# Patient Record
Sex: Male | Born: 1940
Health system: Southern US, Community
[De-identification: ages and names within clinical notes are randomized; demographics above are authoritative.]

## PROBLEM LIST (undated history)

## (undated) ENCOUNTER — Ambulatory Visit: Payer: Medicare Other

## (undated) DIAGNOSIS — H9209 Otalgia, unspecified ear: Secondary | ICD-10-CM

## (undated) DIAGNOSIS — M1990 Unspecified osteoarthritis, unspecified site: Secondary | ICD-10-CM

## (undated) DIAGNOSIS — N400 Enlarged prostate without lower urinary tract symptoms: Secondary | ICD-10-CM

## (undated) DIAGNOSIS — R972 Elevated prostate specific antigen [PSA]: Secondary | ICD-10-CM

## (undated) DIAGNOSIS — R079 Chest pain, unspecified: Secondary | ICD-10-CM

## (undated) DIAGNOSIS — R21 Rash and other nonspecific skin eruption: Secondary | ICD-10-CM

## (undated) DIAGNOSIS — Z87898 Personal history of other specified conditions: Secondary | ICD-10-CM

## (undated) DIAGNOSIS — E785 Hyperlipidemia, unspecified: Secondary | ICD-10-CM

## (undated) HISTORY — DX: Unspecified osteoarthritis, unspecified site: M19.90

## (undated) HISTORY — DX: Hyperlipidemia, unspecified: E78.5

## (undated) HISTORY — DX: Personal history of other specified conditions: Z87.898

## (undated) HISTORY — PX: JOINT REPLACEMENT: SHX530

## (undated) HISTORY — PX: OTHER SURGICAL HISTORY: SHX169

## (undated) HISTORY — DX: Benign prostatic hyperplasia without lower urinary tract symptoms: N40.0

## (undated) HISTORY — PX: EYE SURGERY: SHX253

## (undated) HISTORY — DX: Chest pain, unspecified: R07.9

## (undated) HISTORY — DX: Elevated prostate specific antigen (PSA): R97.20

## (undated) HISTORY — DX: Rash and other nonspecific skin eruption: R21

## (undated) HISTORY — PX: TONSILLECTOMY: SUR1361

## (undated) HISTORY — DX: Otalgia, unspecified ear: H92.09

---

## 1996-12-12 HISTORY — PX: OTHER SURGICAL HISTORY: SHX169

## 1999-02-08 ENCOUNTER — Ambulatory Visit (HOSPITAL_BASED_OUTPATIENT_CLINIC_OR_DEPARTMENT_OTHER): Admission: RE | Admit: 1999-02-08 | Discharge: 1999-02-08 | Payer: Self-pay | Admitting: *Deleted

## 2000-10-17 ENCOUNTER — Encounter (INDEPENDENT_AMBULATORY_CARE_PROVIDER_SITE_OTHER): Payer: Self-pay

## 2000-10-17 ENCOUNTER — Other Ambulatory Visit: Admission: RE | Admit: 2000-10-17 | Discharge: 2000-10-17 | Payer: Self-pay | Admitting: Urology

## 2000-12-12 HISTORY — PX: OTHER SURGICAL HISTORY: SHX169

## 2001-10-23 ENCOUNTER — Encounter: Payer: Self-pay | Admitting: *Deleted

## 2001-10-25 ENCOUNTER — Inpatient Hospital Stay (HOSPITAL_COMMUNITY): Admission: RE | Admit: 2001-10-25 | Discharge: 2001-10-29 | Payer: Self-pay | Admitting: *Deleted

## 2001-11-30 ENCOUNTER — Encounter: Admission: RE | Admit: 2001-11-30 | Discharge: 2002-02-28 | Payer: Self-pay | Admitting: *Deleted

## 2005-11-21 ENCOUNTER — Encounter (INDEPENDENT_AMBULATORY_CARE_PROVIDER_SITE_OTHER): Payer: Self-pay | Admitting: Specialist

## 2005-11-21 ENCOUNTER — Inpatient Hospital Stay (HOSPITAL_COMMUNITY): Admission: RE | Admit: 2005-11-21 | Discharge: 2005-11-24 | Payer: Self-pay | Admitting: Urology

## 2006-08-22 ENCOUNTER — Ambulatory Visit: Payer: Self-pay | Admitting: Internal Medicine

## 2006-08-29 ENCOUNTER — Ambulatory Visit: Payer: Self-pay | Admitting: Internal Medicine

## 2008-03-06 ENCOUNTER — Ambulatory Visit: Payer: Self-pay | Admitting: Internal Medicine

## 2008-03-06 LAB — CONVERTED CEMR LAB
ALT: 18 units/L (ref 0–53)
AST: 27 units/L (ref 0–37)
Albumin: 3.9 g/dL (ref 3.5–5.2)
Alkaline Phosphatase: 59 units/L (ref 39–117)
BUN: 9 mg/dL (ref 6–23)
Basophils Absolute: 0 10*3/uL (ref 0.0–0.1)
Basophils Relative: 0.9 % (ref 0.0–1.0)
Bilirubin Urine: NEGATIVE
Bilirubin, Direct: 0.1 mg/dL (ref 0.0–0.3)
CO2: 32 meq/L (ref 19–32)
Calcium: 9.1 mg/dL (ref 8.4–10.5)
Chloride: 103 meq/L (ref 96–112)
Cholesterol: 162 mg/dL (ref 0–200)
Creatinine, Ser: 0.9 mg/dL (ref 0.4–1.5)
Eosinophils Absolute: 0.2 10*3/uL (ref 0.0–0.6)
Eosinophils Relative: 4.5 % (ref 0.0–5.0)
GFR calc Af Amer: 108 mL/min
GFR calc non Af Amer: 89 mL/min
Glucose, Bld: 93 mg/dL (ref 70–99)
HCT: 45.1 % (ref 39.0–52.0)
HDL: 43.1 mg/dL (ref >39.0)
Hemoglobin, Urine: NEGATIVE
Hemoglobin: 14.5 g/dL (ref 13.0–17.0)
Ketones, ur: NEGATIVE mg/dL
LDL Cholesterol: 98 mg/dL (ref 0–99)
Leukocytes, UA: NEGATIVE
Lymphocytes Relative: 31.6 % (ref 12.0–46.0)
MCHC: 32.1 g/dL (ref 30.0–36.0)
MCV: 93.8 fL (ref 78.0–100.0)
Monocytes Absolute: 0.4 10*3/uL (ref 0.2–0.7)
Monocytes Relative: 8.4 % (ref 3.0–11.0)
Neutro Abs: 2.8 10*3/uL (ref 1.4–7.7)
Neutrophils Relative %: 54.6 % (ref 43.0–77.0)
Nitrite: NEGATIVE
PSA: 1.42 ng/mL (ref 0.10–4.00)
Platelets: 204 10*3/uL (ref 150–400)
Potassium: 4.2 meq/L (ref 3.5–5.1)
RBC: 4.81 M/uL (ref 4.22–5.81)
RDW: 13.3 % (ref 11.5–14.6)
Sodium: 139 meq/L (ref 135–145)
Specific Gravity, Urine: 1.01 (ref 1.000–1.03)
TSH: 1.85 microintl units/mL (ref 0.35–5.50)
Total Bilirubin: 0.7 mg/dL (ref 0.3–1.2)
Total CHOL/HDL Ratio: 3.8
Total Protein, Urine: NEGATIVE mg/dL
Total Protein: 6.6 g/dL (ref 6.0–8.3)
Triglycerides: 103 mg/dL (ref 0–149)
Urine Glucose: NEGATIVE mg/dL
Urobilinogen, UA: 0.2 (ref 0.0–1.0)
VLDL: 21 mg/dL (ref 0–40)
WBC: 5 10*3/uL (ref 4.5–10.5)
pH: 6.5 (ref 5.0–8.0)

## 2008-03-10 ENCOUNTER — Ambulatory Visit: Payer: Self-pay | Admitting: Internal Medicine

## 2008-03-10 DIAGNOSIS — M1990 Unspecified osteoarthritis, unspecified site: Secondary | ICD-10-CM | POA: Insufficient documentation

## 2008-03-10 DIAGNOSIS — Z87898 Personal history of other specified conditions: Secondary | ICD-10-CM

## 2008-03-10 DIAGNOSIS — R079 Chest pain, unspecified: Secondary | ICD-10-CM

## 2008-03-10 DIAGNOSIS — E785 Hyperlipidemia, unspecified: Secondary | ICD-10-CM | POA: Insufficient documentation

## 2008-03-10 DIAGNOSIS — N400 Enlarged prostate without lower urinary tract symptoms: Secondary | ICD-10-CM

## 2008-03-10 HISTORY — DX: Hyperlipidemia, unspecified: E78.5

## 2008-03-10 HISTORY — DX: Benign prostatic hyperplasia without lower urinary tract symptoms: N40.0

## 2008-03-10 HISTORY — DX: Unspecified osteoarthritis, unspecified site: M19.90

## 2008-03-10 HISTORY — DX: Personal history of other specified conditions: Z87.898

## 2008-03-10 HISTORY — DX: Chest pain, unspecified: R07.9

## 2008-03-17 ENCOUNTER — Encounter: Payer: Self-pay | Admitting: Internal Medicine

## 2008-03-18 ENCOUNTER — Encounter: Payer: Self-pay | Admitting: Internal Medicine

## 2008-03-18 ENCOUNTER — Ambulatory Visit: Payer: Self-pay

## 2008-12-09 ENCOUNTER — Ambulatory Visit: Payer: Self-pay | Admitting: Internal Medicine

## 2008-12-09 DIAGNOSIS — H9209 Otalgia, unspecified ear: Secondary | ICD-10-CM

## 2008-12-09 HISTORY — DX: Otalgia, unspecified ear: H92.09

## 2009-03-16 ENCOUNTER — Encounter: Payer: Self-pay | Admitting: Internal Medicine

## 2009-03-16 ENCOUNTER — Ambulatory Visit: Payer: Self-pay | Admitting: Family Medicine

## 2009-03-16 DIAGNOSIS — R972 Elevated prostate specific antigen [PSA]: Secondary | ICD-10-CM | POA: Insufficient documentation

## 2009-03-16 DIAGNOSIS — R21 Rash and other nonspecific skin eruption: Secondary | ICD-10-CM

## 2009-03-16 HISTORY — DX: Elevated prostate specific antigen (PSA): R97.20

## 2009-03-16 HISTORY — DX: Rash and other nonspecific skin eruption: R21

## 2009-03-17 LAB — CONVERTED CEMR LAB
ALT: 18 units/L (ref 0–53)
AST: 33 units/L (ref 0–37)
Albumin: 4.1 g/dL (ref 3.5–5.2)
Alkaline Phosphatase: 63 units/L (ref 39–117)
BUN: 23 mg/dL (ref 6–23)
Basophils Absolute: 0 10*3/uL (ref 0.0–0.1)
Basophils Relative: 0.7 % (ref 0.0–3.0)
Bilirubin Urine: NEGATIVE
Bilirubin, Direct: 0.2 mg/dL (ref 0.0–0.3)
CO2: 29 meq/L (ref 19–32)
Calcium: 9.2 mg/dL (ref 8.4–10.5)
Chloride: 108 meq/L (ref 96–112)
Cholesterol: 171 mg/dL (ref 0–200)
Creatinine, Ser: 0.7 mg/dL (ref 0.4–1.5)
Eosinophils Absolute: 0.1 10*3/uL (ref 0.0–0.7)
Eosinophils Relative: 1.8 % (ref 0.0–5.0)
GFR calc non Af Amer: 118.45 mL/min (ref >60)
Glucose, Bld: 83 mg/dL (ref 70–99)
HCT: 44.8 % (ref 39.0–52.0)
HDL: 42 mg/dL (ref >39.00)
Hemoglobin, Urine: NEGATIVE
Hemoglobin: 15 g/dL (ref 13.0–17.0)
LDL Cholesterol: 113 mg/dL — ABNORMAL HIGH (ref 0–99)
Leukocytes, UA: NEGATIVE
Lymphocytes Relative: 31 % (ref 12.0–46.0)
Lymphs Abs: 1.7 10*3/uL (ref 0.7–4.0)
MCHC: 33.5 g/dL (ref 30.0–36.0)
MCV: 92.2 fL (ref 78.0–100.0)
Monocytes Absolute: 0.6 10*3/uL (ref 0.1–1.0)
Monocytes Relative: 10.5 % (ref 3.0–12.0)
Neutro Abs: 3.2 10*3/uL (ref 1.4–7.7)
Neutrophils Relative %: 56 % (ref 43.0–77.0)
Nitrite: NEGATIVE
PSA: 2.6 ng/mL (ref 0.10–4.00)
Platelets: 184 10*3/uL (ref 150.0–400.0)
Potassium: 4.1 meq/L (ref 3.5–5.1)
RBC: 4.86 M/uL (ref 4.22–5.81)
RDW: 13.1 % (ref 11.5–14.6)
Sodium: 141 meq/L (ref 135–145)
Specific Gravity, Urine: >=1.03 (ref 1.000–1.030)
TSH: 1.88 microintl units/mL (ref 0.35–5.50)
Total Bilirubin: 0.9 mg/dL (ref 0.3–1.2)
Total CHOL/HDL Ratio: 4
Total Protein: 6.9 g/dL (ref 6.0–8.3)
Triglycerides: 79 mg/dL (ref 0.0–149.0)
Urine Glucose: NEGATIVE mg/dL
Urobilinogen, UA: 0.2 (ref 0.0–1.0)
VLDL: 15.8 mg/dL (ref 0.0–40.0)
WBC: 5.6 10*3/uL (ref 4.5–10.5)
pH: 6 (ref 5.0–8.0)

## 2009-04-13 ENCOUNTER — Ambulatory Visit: Payer: Self-pay | Admitting: Gastroenterology

## 2009-04-23 ENCOUNTER — Ambulatory Visit: Payer: Self-pay | Admitting: Gastroenterology

## 2009-04-23 LAB — HM COLONOSCOPY

## 2010-03-18 ENCOUNTER — Ambulatory Visit: Payer: Self-pay | Admitting: Internal Medicine

## 2011-01-09 LAB — CONVERTED CEMR LAB
ALT: 26 units/L (ref 0–53)
AST: 38 units/L — ABNORMAL HIGH (ref 0–37)
Albumin: 4.4 g/dL (ref 3.5–5.2)
Alkaline Phosphatase: 57 units/L (ref 39–117)
BUN: 19 mg/dL (ref 6–23)
Basophils Absolute: 0 10*3/uL (ref 0.0–0.1)
Basophils Relative: 0.5 % (ref 0.0–3.0)
Bilirubin Urine: NEGATIVE
Bilirubin, Direct: 0.2 mg/dL (ref 0.0–0.3)
CO2: 31 meq/L (ref 19–32)
Calcium: 9.5 mg/dL (ref 8.4–10.5)
Chloride: 106 meq/L (ref 96–112)
Cholesterol: 164 mg/dL (ref 0–200)
Creatinine, Ser: 0.9 mg/dL (ref 0.4–1.5)
Eosinophils Absolute: 0 10*3/uL (ref 0.0–0.7)
Eosinophils Relative: 0.6 % (ref 0.0–5.0)
GFR calc non Af Amer: 88.37 mL/min (ref >60)
Glucose, Bld: 90 mg/dL (ref 70–99)
HCT: 45.5 % (ref 39.0–52.0)
HDL: 46.8 mg/dL (ref >39.00)
Hemoglobin, Urine: NEGATIVE
Hemoglobin: 15.4 g/dL (ref 13.0–17.0)
Ketones, ur: NEGATIVE mg/dL
LDL Cholesterol: 105 mg/dL — ABNORMAL HIGH (ref 0–99)
Leukocytes, UA: NEGATIVE
Lymphocytes Relative: 23.5 % (ref 12.0–46.0)
Lymphs Abs: 1.7 10*3/uL (ref 0.7–4.0)
MCHC: 33.8 g/dL (ref 30.0–36.0)
MCV: 93.1 fL (ref 78.0–100.0)
Monocytes Absolute: 0.5 10*3/uL (ref 0.1–1.0)
Monocytes Relative: 6.9 % (ref 3.0–12.0)
Neutro Abs: 5 10*3/uL (ref 1.4–7.7)
Neutrophils Relative %: 68.5 % (ref 43.0–77.0)
Nitrite: NEGATIVE
PSA: 2.3 ng/mL (ref 0.10–4.00)
Platelets: 212 10*3/uL (ref 150.0–400.0)
Potassium: 4.4 meq/L (ref 3.5–5.1)
RBC: 4.89 M/uL (ref 4.22–5.81)
RDW: 13.5 % (ref 11.5–14.6)
Sodium: 143 meq/L (ref 135–145)
Specific Gravity, Urine: >=1.03 (ref 1.000–1.030)
TSH: 1.89 microintl units/mL (ref 0.35–5.50)
Total Bilirubin: 0.9 mg/dL (ref 0.3–1.2)
Total CHOL/HDL Ratio: 4
Total Protein, Urine: NEGATIVE mg/dL
Total Protein: 7.4 g/dL (ref 6.0–8.3)
Triglycerides: 59 mg/dL (ref 0.0–149.0)
Urine Glucose: NEGATIVE mg/dL
Urobilinogen, UA: 0.2 (ref 0.0–1.0)
VLDL: 11.8 mg/dL (ref 0.0–40.0)
WBC: 7.3 10*3/uL (ref 4.5–10.5)
pH: 5.5 (ref 5.0–8.0)

## 2011-01-11 NOTE — Assessment & Plan Note (Signed)
Summary: CPX / MEDICARE /LABS SAME DAY/CD  RS'D PER PT/NWS   Vital Signs:  Patient profile:   70 year old male Height:      71 inches Weight:      178.25 pounds BMI:     24.95 O2 Sat:      97 % on Room air Temp:     96.8 degrees F oral Pulse rate:   69 / minute BP sitting:   120 / 72  (left arm) Cuff size:   regular  Vitals Entered ByZella Ball Ewing (March 18, 2010 8:31 AM)  O2 Flow:  Room air  Preventive Care Screening  Last Flu Shot:    Date:  09/11/2009    Results:  given   CC: CPX/RE   CC:  CPX/RE.  History of Present Illness: overall doing well, no complaints,  Pt denies CP, sob, doe, wheezing, orthopnea, pnd, worsening LE edema, palps, dizziness or syncope  Pt denies new neuro symptoms such as headache, facial or extremity weakness    Problems Prior to Update: 1)  Psa, Increased  (ICD-790.93) 2)  Rash-nonvesicular  (ICD-782.1) 3)  Preventive Health Care  (ICD-V70.0) 4)  Ear Pain, Left  (ICD-388.70) 5)  Chest Pain  (ICD-786.50) 6)  Hyperlipidemia  (ICD-272.4) 7)  Benign Prostatic Hypertrophy  (ICD-600.00) 8)  Preventive Health Care  (ICD-V70.0) 9)  Degenerative Joint Disease  (ICD-715.90) 10)  Benign Prostatic Hypertrophy, Hx of  (ICD-V13.8)  Medications Prior to Update: 1)  Aspirin 81 Mg  Tbec (Aspirin) .Marland Kitchen.. 1 By Mouth Qd 2)  Move Free .Marland Kitchen.. 2 Tabs By Mouth Qd 3)  Triamcinolone Acetonide 0.5 % Crea (Triamcinolone Acetonide) .... Apply Bid To Affected Area  Current Medications (verified): 1)  Aspirin 81 Mg  Tbec (Aspirin) .Marland Kitchen.. 1 By Mouth Qd 2)  Move Free .Marland Kitchen.. 2 Tabs By Mouth Qd 3)  Triamcinolone Acetonide 0.5 % Crea (Triamcinolone Acetonide) .... Apply Bid To Affected Area  Allergies (verified): No Known Drug Allergies  Past History:  Past Medical History: Last updated: 03/10/2008 Benign prostatic hypertrophy DJD hx of elevated PSA about 7 - biopsy x 3 neg Hyperlipidemia  Past Surgical History: Last updated: 03/10/2008 Transurethral resection of  prostate 2002/hip replacement left 2000/back surgery s/p left knee arthroscopy  Family History: Last updated: 03/10/2008 mother with dementia  Social History: Last updated: 03/10/2008 Alcohol use-no Never Smoked Married retired Scientist, research (medical) no children  Risk Factors: Smoking Status: never (03/10/2008)  Review of Systems  The patient denies anorexia, fever, vision loss, decreased hearing, hoarseness, chest pain, syncope, dyspnea on exertion, peripheral edema, prolonged cough, headaches, hemoptysis, abdominal pain, melena, hematochezia, severe indigestion/heartburn, hematuria, muscle weakness, suspicious skin lesions, difficulty walking, depression, unusual weight change, abnormal bleeding, enlarged lymph nodes, and angioedema.         all otherwise negative per pt -  no worsening recent urination or other bowel or bladder changes  Physical Exam  General:  alert and well-developed.   Head:  normocephalic and atraumatic.   Eyes:  vision grossly intact, pupils equal, and pupils round.   Ears:  R ear normal and L ear normal.   Nose:  no external deformity and no nasal discharge.   Mouth:  no gingival abnormalities and pharynx pink and moist.   Neck:  supple and no masses.   Lungs:  normal respiratory effort and normal breath sounds.   Heart:  normal rate and regular rhythm.   Abdomen:  soft, non-tender, and normal bowel sounds.   Msk:  no  joint tenderness and no joint swelling.   Extremities:  no edema, no erythema  Neurologic:  cranial nerves II-XII intact and strength normal in all extremities.   Skin:  color normal and no rashes.     Impression & Recommendations:  Problem # 1:  Preventive Health Care (ICD-V70.0) Overall doing well, age appropriate education and counseling updated and referral for appropriate preventive services done unless declined, immunizations up to date or declined, diet counseling done if overweight, urged to quit smoking if smokes , most recent labs  reviewed and current ordered if appropriate, ecg reviewed or declined (interpretation per ECG scanned in the EMR if done); information regarding Medicare Prevention requirements given if appropriate  Orders: EKG w/ Interpretation (93000) TLB-BMP (Basic Metabolic Panel-BMET) (80048-METABOL) TLB-CBC Platelet - w/Differential (85025-CBCD) TLB-Hepatic/Liver Function Pnl (80076-HEPATIC) TLB-Lipid Panel (80061-LIPID) TLB-TSH (Thyroid Stimulating Hormone) (84443-TSH) TLB-PSA (Prostate Specific Antigen) (84153-PSA) TLB-Udip w/ Micro (81001-URINE)  Complete Medication List: 1)  Aspirin 81 Mg Tbec (Aspirin) .Marland Kitchen.. 1 by mouth qd 2)  Move Free  .Marland Kitchen.. 2 tabs by mouth qd 3)  Triamcinolone Acetonide 0.5 % Crea (Triamcinolone acetonide) .... Apply bid to affected area  Patient Instructions: 1)  Please go to the Lab in the basement for your blood and/or urine tests today 2)  Continue all previous medications as before this visit  3)  Please schedule a follow-up appointment in 1 year or sooner if needed

## 2011-04-29 NOTE — H&P (Signed)
[place]  Patient:    Alexander Castillo, Alexander Castillo Visit Number: [id number] MRN: [id number]          Service Type: Attending:  Reynolds Bowl, M.D. Dictated by:   Reynolds Bowl, M.D. Adm. Date:  10/16/01                           History and Physical  INTRODUCTION: Mr. Kossman is a 70 year old man, with a chief complaint of pain in the left hip associated with decreasing motion.  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  He has not had a particular history of trauma.  At this point it bothers him all the time.  It interferes with all activities and interferes with sleep, and he has been limping.  He has tried taking Celebrex with only transient relief.  We have fully discussed with him multiple possible complications of total hip arthroplasty including dislocation, infection, phlebitis, limited motion, etc., and he would like to proceed on with same.  ALLERGIES: None known.  LONG-TERM MEDICATIONS: Vitamins only.  PAST SURGICAL HISTORY:  1. In 1999 he had a two more more level lumbar surgery to include fusions.     He says his back is now okay.  2. In the 1980s he had knee arthroscopy and has no complaints with regard to     his knee.  SOCIAL HISTORY: He does not smoke cigarettes nor drink ethanol.  He is retired.  REVIEW OF SYSTEMS: No significant cardiorespiratory, GI, or GU problems.  PHYSICAL EXAMINATION:  VITAL SIGNS: Height 5 feet 11 inches.  Weight 200 pounds.  Temperature 97.7, blood pressure 132/70, pulse 56, respirations 16.  HEENT: EOMI.  Tympanic membranes normal.  Pharynx clear.  He has two dentures.  NECK: No cervical adenopathy, no carotid bruits.  Neck moves well without pain.  CHEST: Good  volume.  Breath sounds clear.  HEART: Regular rhythm.  No murmurs heard.  ABDOMEN: Soft, nontender, no palpable masses.  Bowel sounds normal.  ORTHOPEDIC EVALUATION: Good femoral pulses without bruits.  No associated adenopathy.  Dorsalis pedis pulses good.  No leg edema.  Has antalgic gait. Motor at foot and ankle normal.  Supine he wants to hold the hip in about 30 degrees of flexion, but will let it come down to close to full extension, and the hip will flex to about 100 degrees.  In so doing he has to abduct and externally rotate about 10 degrees.  There is a minimal amount of abduction allowed, perhaps 10 degrees.  LABORATORY DATA: X-rays show complete loss of cartilage space and subchondral cyst of the left hip, and early osteoarthritic changes of the right hip.  ADMITTING DIAGNOSES:  1. Osteoarthritis of left hip.  2. Status postoperative lumbar surgery with fusion.  PLAN: Left total hip arthroplasty, as fully discussed. Dictated by:   Reynolds Bowl, M.D. Attending:  Reynolds Bowl, M.D. DD:  10/16/01 TD:  10/17/01 Job: [id number]

## 2011-04-29 NOTE — H&P (Signed)
NAME:  Alexander Castillo, Alexander Castillo           ACCOUNT NO.:  [id number]   MEDICAL RECORD NO.:  [id number]          PATIENT TYPE:  INP   LOCATION:  0004                         FACILITY:  [facility]   PHYSICIAN:  Sigmund I. Patsi Sears, M.D.DATE OF BIRTH:  [birth year]   DATE OF ADMISSION:  11/21/2005  DATE OF DISCHARGE:                                HISTORY & PHYSICAL   CHIEF COMPLAINT:  Benign prostatic hypertrophy.   HISTORY OF PRESENT ILLNESS:  This is a 70 year old gentleman with a history  of elevated PSA as well as BPH. He has had moderate lower urinary tract  symptom scores in the past. Recently for was elevated PSA, he underwent TRUS  guided prostate biopsy which revealed acute inflammation in the setting of  BPH.  Because of his increasing lower urinary tract symptoms, he has elected  to proceed with surgical management.   PAST MEDICAL HISTORY.:  Osteoarthritis   PAST SURGICAL HISTORY:  1.  Knee surgery.  2.  Back surgery  3.  Hip replacement surgery.   MEDICATIONS:  Multivitamin.   ALLERGIES:  NKDA.   SOCIAL HISTORY:  Denies any alcohol, tobacco or drug use.   FAMILY HISTORY:  Negative for GU cancer.   REVIEW OF SYMPTOMS:  A multisystem review of systems was conducted and is  negative unless outlined above.   PHYSICAL EXAM:  For a complete physical examination, please consult clinic  note dated March 21, 2005 as it is part of the medical record and is  unchanged.   ASSESSMENT/PLAN:  This 70 year old gentleman with a history of benign  prostatic hypertrophy and elevated PSA status post biopsies with increasing  lower urinary tract symptoms. Because of his prostatic volume greater than  100 mL, he has elected after discussing the issues, with gland size and TURP  as well as minimally invasive procedure such as microwave and TUNA, the  patient has elected to proceed with open simple prostatectomy.     ______________________________  Glade Nurse, MD      Sigmund I.  Patsi Sears, M.D.  Electronically Signed    MT/MEDQ  D:  11/21/2005  T:  11/21/2005  Job:  [id number]

## 2011-04-29 NOTE — Op Note (Signed)
. [facility]  Patient:    Alexander Castillo, Alexander Castillo Visit Number: [id number] MRN: [id number]          Service Type: SUR Location: 5000 [id number] Attending Physician:  Maryanna Shape Dictated by:   Reynolds Bowl, M.D. Admit Date:  10/25/2001                             Operative Report  PREOPERATIVE DIAGNOSIS:  Left hip osteoarthritis.  POSTOPERATIVE DIAGNOSIS:  Left hip osteoarthritis.  OPERATIVE PROCEDURES:  Osteonics total hip arthroplasty with cementing of femoral component.  ANESTHESIA:  General.  SURGEON:  Reynolds Bowl, M.D.  ASSISTANT:  Cammy Copa, M.D.  DESCRIPTION OF PROCEDURE:  The patient was given 1 g of Ancef IV and then a general anesthesia by endotracheal tube.  Then a Foley catheter was put in place.  He was then placed in the lateral position with the left hip up and supported with Congo frame.  A axillary roll was placed.  All other areas were padded.  The perineum was isolated with U drapes and was draped from the edges of the drapes to and including the toes with DuraPrep.  With the hip flexed to 90 degrees, the posterolateral incision was made.  The iliotibial band was split for a short distance.  Then the fibers of the gluteus were digitally separated, thus exposing the underlying bursa and external rotators.  The pyriformis was tagged and released and the rest of the external rotators released from the bone edge distally until I could see the lesser trochanter.  The capsule was released as a fairly large flap based posteriorly.  We then dislocated the hip with some difficulty.  It was fairly tight.  We had to cut into the labrum and manipulate for a while to do that.  Then we dislocated the hip, which had significant osteoarthritic changes.  I resected the neck approximately 13 mm in length and again digitally palpated, but did not dissect to see the sciatic nerve and it remained protected.  There was  a fibrocystic mass along the anterosuperior lip of the acetabulum and part of that was turned into the joint.  This was excised along with an osteophyte. We then progressively reamed the acetabulum to a size 58 and then implanted a Trident PSL 58G acetabular shell in approximately 45 degrees of abduction and about 25 degrees anteversion.  This seated to the floor and seemed to be very rigidly fixed.  I then installed a trial 58 cup liner and then paid attention to the femoral side.  The femoral side was reamed sequentially to accept cemented stem #8.  Then it was rasped likewise.  We left the #8 rash in place and passed a 28 mm C taper head trial +0.  Good reduction and excellent stability allowed extension out to neutral.  Could flex and abduct widely. With adduction of 20-30 degrees and internal rotation approximately 40 degrees the head would begin to dislocate.  I felt that was all very stable.  We then elected on these sizes.  The acetabulum was again irrigated with a pulsatile lavage.  The hole eliminating screw was put in the acetabular shell.  Then the Trident polyethylene insert with 10 degree posterior lip was inserted as we had done with the trial.  This was well seated.  Attention was directed to the femoral side.  We trialed and then decided on a #4 distal  cement plug, which was installed snugly.  We then used a brush and pulsatile lavage to repeatedly lavage the canal.  The femur was then cemented in place by first filling the canal with cement and sucking away of blood as it accumulated.  Then I used a proximal impacting device along with the gun and obtained good impaction of the cement.  Some other cement was used to butter the femoral component and to hold the 13 mm universal distal cement spacer in place.  The femoral component was then inserted down the canal snugly with good impaction and held laterally to avoid varus.  All excess cement was removed.  I held this in  position, paying attention to anteversion and against varus until the cement was hardened.  We then spent time looking for other particulate debris, removed it all, and then reduced the hip.  Again ran it through a range of motion and felt we had excellent stability.  The area was copiously irrigated with a pulsatile lavage.  The iliotibial band was approximated with 1-0 Vicryl and two nonabsorbable sutures.  The myofascia of the gluteus maximum was approximated with 2-0 Vicryl.  The subcutaneous tissues were approximated with 2-0 Vicryl.  The skin edges were held in apposition with metal staples.  We then applied a dressing and then removed the patient from the bed and positioned him with a pillow between his knees and applied a knee immobilizer so he could not hyperflex.  It was felt that the leg lengths were equal.  The estimated blood loss was perhaps 300 cc and none was replaced. Dictated by:   Reynolds Bowl, M.D. Attending Physician:  Maryanna Shape DD:  10/25/01 TD:  10/25/01 Job: [id number]

## 2011-04-29 NOTE — Op Note (Signed)
NAME:  Alexander Castillo, Alexander Castillo           ACCOUNT NO.:  [id number]   MEDICAL RECORD NO.:  [id number]          PATIENT TYPE:  INP   LOCATION:  0004                         FACILITY:  [facility]   PHYSICIAN:  Sigmund I. Patsi Sears, M.D.DATE OF BIRTH:  [birth year]   DATE OF PROCEDURE:  11/21/2005  DATE OF DISCHARGE:                                 OPERATIVE REPORT   PREOPERATIVE DIAGNOSIS:  Benign prostatic hypertrophy.   POSTOPERATIVE DIAGNOSIS:  Benign prostatic hypertrophy.   PROCEDURE:  Simple retropubic prostatectomy.   SURGEON:  Dr. Patsi Sears   ASSISTANT:  Dr. Blanch Media   ANESTHESIA:  General endotracheal.   SPECIMENS:  Prostate adenoma.   DRAINS:  A 10-French Blake to the space of Retzius.   PROCEDURE:  The patient was identified by his wrist bracelet and brought to  room 10, where he received preprocedure antibiotics and was administered  general anesthesia.  He was prepped and draped in the usual sterile fashion,  placing the patient's hips so the bed could be flexed to expose his  prostate.  We fully padded the patient to minimize risk of peripheral  neuropathy and compartment syndrome.  Next, we placed an 18-French Foley  catheter in his anterior urethra to the level of the urinary bladder.  Clear  urine output was obtained, and we made a 10 cm low midline incision from the  pubic symphysis to inferior to his umbilicus.  We dissected down through the  dermis, subcutaneous fat, Scarpa fascia's to the anterior sheath with Bovie  cautery, maintaining hemostasis as we dissected.  The anterior sheath was  identified, and the decussating fibers were visualized, and we slit the  anterior fascia in the midline along the length of the incision with Bovie  cautery.  We next split the medial bellies of the pyramidalis and the rectus  bodies bluntly.  Omni-tract retractor was set, and we developed the space of  Retzius bluntly.  Next, we inspected the anterior surface of the prostate.  The  patient had a markedly large prostate.  We dissected with sharp and  Bovie cautery the fat over the anterior surface of the prostate.  Next, we  placed stay sutures with 0 Vicryl and the lateral aspect of the anterior  prostate.  Next, we made a transverse incision on the anterior surface  prostate with Bovie cautery down through the capsule.  Next, we began our  dissection between the capsule and the adenoma with Metzenbaum scissors  circumferentially in our capsulotomy.  Next, we searched the surgeon's first  finger to bluntly enucleate the entire prostatic adenoma circumferentially  from the base to the apex.  It was split anteriorly to remove the specimen  en bloc.  Next, we copiously irrigated the wound, and we addressed venous  bleeding in the prostatic capsule edges with Bovie cautery.  There was  bleeding from the prostatic urethral bed which was addressed with Bovie  cautery as well as with 5 mL of FloSeal surgical sealant, and pressure was  held.  This nicely decreased bleeding.  There was 1 large venous ooze from  the inferior leaflet of the capsulotomy, and this  was addressed with a  single Weck clip as well as Bovie cautery which stopped the bleeding.  Next,  we closed our capsulotomy with running 0 Vicryl.  Next, we irrigated our  Foley catheter.  We noted watertight closure in our anastomosis.  We  irrigated out a small amount of clot from the level of the urinary bladder  and placed the Foley to traction.  Next, we inspected our the surgical  field, and it was found to be hemostatic.  We placed a 0.25 cm left lower  quadrant drain incision with the knife and brought a Vanderbilt tonsil  through the fascia on a pass.  The 10-French fully-fluted Blake drain was  brought through on a pass. It was tailored to length, placed over our  capsulotomy.  It was fixed to the skin with a 4-0 nylon suture.  Next, we  reapproximated the rectus muscle bodies with interrupted 0 Vicryl.   Fascia  was closed with a running #1 PDS.  Skin was then closed with a running 4-0  subcuticular Monocryl.  Dressings were applied.  Foley catheter was placed  to traction with a leg strap.  It was again irrigated and found to irrigate  clear blue urine, status post administration of indigo carmine.  There were  no clots associated with this.  It was tacked to drainage bag.  Blake drain  was attached to bulb and patient was reversed from his anesthesia without  complication.  Please note Dr. Jethro Bolus was present and  participated in all aspects of the case.     ______________________________  Glade Nurse, MD      Sigmund I. Patsi Sears, M.D.  Electronically Signed    MT/MEDQ  D:  11/21/2005  T:  11/21/2005  Job:  [id number]

## 2011-04-29 NOTE — Discharge Summary (Signed)
NAME:  Alexander Castillo, Alexander Castillo           ACCOUNT NO.:  [id number]   MEDICAL RECORD NO.:  [id number]          PATIENT TYPE:  INP   LOCATION:  1418                         [facility]ACILITY:  [facility]   PHYSICIAN:  Sigmund I. Patsi Sears, M.D.DATE OF BIRTH:  1941-04-06   DATE OF ADMISSION:  11/21/2005  DATE OF DISCHARGE:  11/24/2005                                 DISCHARGE SUMMARY   DISCHARGE DIAGNOSIS:  Benign prostatic hypertrophy.   PROCEDURE:  Simple retropubic prostatectomy.   SURGEON:  Sigmund I. Patsi Sears, M.D.   CONSULTS:  None.   HISTORY OF PRESENT ILLNESS:  Briefly, this is a 70 year old gentleman with a  history of elevated PSA's and BPH.  He has had increasing lower urinary  tract symptoms in the past with a negative prostate biopsy.  Because of the  size of his prostatic adenoma, he has elected to proceed with an open simple  retropubic prostatectomy.   HOSPITAL COURSE:  On November 21, 2005, the patient was taken to the  operating room and underwent the above-named procedure, which he tolerated  without complication.  For a more complete description of this procedure,  please consult the operative note.  Postoperatively, he was taken to the  PACU and then to the urology floor in stable condition, where he remained  throughout his hospital stay.  His hospital stay was complicated by anemia,  which was most likely surgical in nature.  His hemoglobin nadired at 7.4.  He had no secondary signs; however, secondary to his cardiovascular history,  we elected to transfuse him with 2 units of packed red blood cells, to which  his hemoglobin responded appropriately.  The patient's hospital course was  otherwise uncomplicated.  He consisted in progressive toleration of  ambulation, pain, and diet.  His urinary tract was managed with a Foley  catheter connected to continuous bladder irrigation, which he was weaned off  over the several first postoperative days.  He had a drain over his  surgical  anastomosis, which decreased in output over his hospitalization.  This was  removed after it was putting out less than 10 cc per shift over a 24 hour  period.   On November 24, 2005, patient was in stable condition to be discharged home.   PHYSICAL EXAMINATION:  VITAL SIGNS:  Temperature 99, pulse 87, respirations  20, blood pressure 111/64.  ABDOMEN:  Positive bowel sounds.  Soft, nondistended, nontender to palpation  without costovertebral angle tenderness.  The incision was clean, dry, and  intact.  No surrounding erythema or exudate.  The Foley catheter was in  place, draining clear urine without clot.   DISPOSITION:  To home.   DISCHARGE INSTRUCTIONS:  Patient was given routine wound care instructions.  Instructed to call or follow up if he has any fevers, chills, redness, or  drainage around his wound.  He is instructed not to lift more than 10 pounds  for the next six week.  He is instructed he may begin showering; however,  not to soak his wound until postoperative day #10.  He is given routine  Foley care instructions and given a leg bag and  an overnight bag.  He is  instructed to call or return immediately if he begins to experience drainage  around his catheter or acute decrease in output into his collection bag.  He  understands and agrees with the instructions.   DISCHARGE PLAN:  1.  Vicodin.  2.  Colace.  3.  Resume previous meds.     ______________________________  Glade Nurse, MD      Sigmund I. Patsi Sears, M.D.  Electronically Signed    MT/MEDQ  D:  11/24/2005  T:  11/25/2005  Job:  [id number]

## 2011-04-29 NOTE — Discharge Summary (Signed)
[place]. [facility]  Patient:    Alexander Castillo, Alexander Castillo Visit Number: [id number] MRN: [id number]          Service Type: Attending:  Reynolds Bowl, M.D. Dictated by:   Reynolds Bowl, M.D. Adm. Date:  10/25/01 Disc. Date: 10/29/01                             Discharge Summary  ADMISSION DIAGNOSES: 1. Osteoarthritis left hip. 2. Status post operative lumbar surgery with spinal arthrodesis.  HISTORY OF PRESENT ILLNESS:  See that dictated on admission.  HOSPITAL COURSE:  On October 25, 2001, the patient underwent left total hip arthroplasty as detailed in the operative note.  His femoral component was cemented, and he had excellent stability.  Pre- and postoperatively he was on prophylactic antibiotics, and postoperatively he was begun on prophylactic Coumadin with a goal of keeping the INR at 2.0.  Foley catheter was inserted at surgery and was removed one day postoperatively.  At surgery it was estimated the blood loss was 300; none was replaced.  On the first day postoperatively, his preoperative hemoglobin of 15.5 had dropped to 12.8.  He immediately had good leg control, could move his hip and ankles well.  Was eating well and ambulated.  He was seen by physical therapy and begun on partial weightbearing.  By October 29, 2001, he was seeking discharge, stating he was feeling fine and wanted to go home.  At that point it was felt that he had satisfactory leg control.  His wound was healing well.  He did have a couple of areas where his dressing had been removed and some local skin was elevated.  He had good motors, no significant peripheral edema, and was felt to be safe by physical therapy; therefore, he was discharged to home care with instructions to partial weightbear, take Tylox for pain.  Do all the normal precautions in regard to total hip arthroplasty.  We planned on having his sutures removed by the visiting nurse at about 10 days.  He was to  come to the office in a couple of weeks.  He would continue the prophylactic Coumadin for a total of four weeks.  He would call the office with any questions or problems. Dictated by:   Reynolds Bowl, M.D. Attending:  Reynolds Bowl, M.D. DD:  11/20/01 TD:  11/20/01 Job: [id number]

## 2011-05-04 ENCOUNTER — Other Ambulatory Visit: Payer: Self-pay | Admitting: Orthopaedic Surgery

## 2011-05-04 DIAGNOSIS — M25552 Pain in left hip: Secondary | ICD-10-CM

## 2011-05-04 DIAGNOSIS — R52 Pain, unspecified: Secondary | ICD-10-CM

## 2011-05-05 ENCOUNTER — Ambulatory Visit
Admission: RE | Admit: 2011-05-05 | Discharge: 2011-05-05 | Disposition: A | Discharge status: Home / Self Care | Payer: Medicare Other | Source: Ambulatory Visit | Attending: Orthopaedic Surgery | Admitting: Orthopaedic Surgery

## 2011-05-05 ENCOUNTER — Other Ambulatory Visit: Payer: Self-pay

## 2011-05-05 ENCOUNTER — Other Ambulatory Visit: Payer: Self-pay | Admitting: Orthopaedic Surgery

## 2011-05-05 DIAGNOSIS — M25552 Pain in left hip: Secondary | ICD-10-CM

## 2011-06-06 ENCOUNTER — Other Ambulatory Visit (INDEPENDENT_AMBULATORY_CARE_PROVIDER_SITE_OTHER): Payer: Medicare Other

## 2011-06-06 DIAGNOSIS — Z0389 Encounter for observation for other suspected diseases and conditions ruled out: Secondary | ICD-10-CM

## 2011-06-06 DIAGNOSIS — F329 Major depressive disorder, single episode, unspecified: Secondary | ICD-10-CM

## 2011-06-06 DIAGNOSIS — Z0000 Encounter for general adult medical examination without abnormal findings: Secondary | ICD-10-CM

## 2011-06-06 DIAGNOSIS — Z125 Encounter for screening for malignant neoplasm of prostate: Secondary | ICD-10-CM

## 2011-06-06 DIAGNOSIS — E785 Hyperlipidemia, unspecified: Secondary | ICD-10-CM

## 2011-06-06 LAB — HEPATIC FUNCTION PANEL
ALT: 22 U/L (ref 0–53)
Bilirubin, Direct: 0.2 mg/dL (ref 0.0–0.3)
Total Bilirubin: 0.9 mg/dL (ref 0.3–1.2)

## 2011-06-06 LAB — URINALYSIS
Bilirubin Urine: NEGATIVE
Ketones, ur: NEGATIVE
Leukocytes, UA: NEGATIVE
Urine Glucose: NEGATIVE
Urobilinogen, UA: 0.2 (ref 0.0–1.0)
pH: 7.5 (ref 5.0–8.0)

## 2011-06-06 LAB — BASIC METABOLIC PANEL
BUN: 22 mg/dL (ref 6–23)
CO2: 26 mEq/L (ref 19–32)
GFR: 88.07 mL/min (ref >60.00)
Glucose, Bld: 88 mg/dL (ref 70–99)
Potassium: 4.2 mEq/L (ref 3.5–5.1)

## 2011-06-06 LAB — CBC WITH DIFFERENTIAL/PLATELET
Basophils Absolute: 0.1 10*3/uL (ref 0.0–0.1)
Eosinophils Absolute: 0.1 10*3/uL (ref 0.0–0.7)
HCT: 44.6 % (ref 39.0–52.0)
Hemoglobin: 15 g/dL (ref 13.0–17.0)
Lymphs Abs: 1.8 10*3/uL (ref 0.7–4.0)
MCHC: 33.7 g/dL (ref 30.0–36.0)
MCV: 92.1 fl (ref 78.0–100.0)
Neutro Abs: 3.2 10*3/uL (ref 1.4–7.7)
RDW: 13.7 % (ref 11.5–14.6)

## 2011-06-06 LAB — TSH: TSH: 2.31 u[IU]/mL (ref 0.35–5.50)

## 2011-06-06 LAB — LIPID PANEL
Total CHOL/HDL Ratio: 5
VLDL: 24.2 mg/dL (ref 0.0–40.0)

## 2011-06-14 ENCOUNTER — Encounter: Payer: Self-pay | Admitting: Internal Medicine

## 2011-06-14 ENCOUNTER — Ambulatory Visit (INDEPENDENT_AMBULATORY_CARE_PROVIDER_SITE_OTHER): Payer: Medicare Other | Admitting: Internal Medicine

## 2011-06-14 VITALS — BP 118/60 | HR 68 | Temp 97.9°F | Resp 14 | Ht 68.75 in | Wt 183.5 lb

## 2011-06-14 DIAGNOSIS — Z0000 Encounter for general adult medical examination without abnormal findings: Secondary | ICD-10-CM

## 2011-06-14 DIAGNOSIS — Z0001 Encounter for general adult medical examination with abnormal findings: Secondary | ICD-10-CM | POA: Insufficient documentation

## 2011-06-14 DIAGNOSIS — Z136 Encounter for screening for cardiovascular disorders: Secondary | ICD-10-CM

## 2011-06-14 DIAGNOSIS — F329 Major depressive disorder, single episode, unspecified: Secondary | ICD-10-CM

## 2011-06-14 NOTE — Patient Instructions (Signed)
Continue all other medications as before Please return in 1 year for your yearly visit, or sooner if needed, with Lab testing done 3-5 days before  

## 2011-06-14 NOTE — Assessment & Plan Note (Signed)

## 2011-06-14 NOTE — Assessment & Plan Note (Signed)
Mild per pt, declines tx or counseling at this time, verified nonsuicidal

## 2011-06-14 NOTE — Progress Notes (Signed)
Subjective:    Patient ID: Alexander Castillo, male    DOB: November 30, 1941, 70 y.o.   MRN: [id number]  HPI  Here for wellness and f/u;  Overall doing ok;  Pt denies CP, worsening SOB, DOE, wheezing, orthopnea, PND, worsening LE edema, palpitations, dizziness or syncope.  Pt denies neurological change such as new Headache, facial or extremity weakness.  Pt denies polydipsia, polyuria, or low sugar symptoms. Pt states overall good compliance with treatment and medications, good tolerability, and trying to follow lower cholesterol diet.  Pt denies worsening depressive symptoms, suicidal ideation or panic. No fever, wt loss, night sweats, loss of appetite, or other constitutional symptoms.  Pt states good ability with ADL's, low fall risk, home safety reviewed and adequate, no significant changes in hearing or vision, and occasionally active with exercise.  Does mention he is not as active as before wife died about 15 mo ago of COPD.  Has gained about 5 lbs since last yr. Has had mild worsening depressive symptoms over the past yr, but no suicidal ideation, or panic, and declines need for tx at this time. Has several friends for support, declines counseling  Past Medical History  Diagnosis Date  . BENIGN PROSTATIC HYPERTROPHY, HX OF 03/10/2008  . BENIGN PROSTATIC HYPERTROPHY 03/10/2008  . CHEST PAIN 03/10/2008  . DEGENERATIVE JOINT DISEASE 03/10/2008  . EAR PAIN, LEFT 12/09/2008  . HYPERLIPIDEMIA 03/10/2008  . PSA, INCREASED 03/16/2009  . RASH-NONVESICULAR 03/16/2009   Past Surgical History  Procedure Date  . Transurethral resection of prostate   . Hip replacement left 2002  . Back surgury 2002  . S/p left knee arthroscopy     reports that he has never smoked. He does not have any smokeless tobacco history on file. He reports that he does not drink alcohol. His drug history not on file. family history includes Dementia in his mother. No Known Allergies Current Outpatient Prescriptions on File Prior to Visit    Medication Sig Dispense Refill  . aspirin 81 MG tablet Take 81 mg by mouth daily.        . Glucosamine-Chondroitin (MOVE FREE PO) Take by mouth 2 (two) times daily.        Marland Kitchen triamcinolone (KENALOG) 0.5 % cream Apply topically 2 (two) times daily.         Review of Systems Review of Systems  Constitutional: Negative for diaphoresis, activity change, appetite change and unexpected weight change.  HENT: Negative for hearing loss, ear pain, facial swelling, mouth sores and neck stiffness.   Eyes: Negative for pain, redness and visual disturbance.  Respiratory: Negative for shortness of breath and wheezing.   Cardiovascular: Negative for chest pain and palpitations.  Gastrointestinal: Negative for diarrhea, blood in stool, abdominal distention and rectal pain.  Genitourinary: Negative for hematuria, flank pain and decreased urine volume.  Musculoskeletal: Negative for myalgias and joint swelling.  Skin: Negative for color change and wound.  Neurological: Negative for syncope and numbness.  Hematological: Negative for adenopathy.  Psychiatric/Behavioral: Negative for hallucinations, self-injury, decreased concentration and agitation.      Objective:   Physical Exam BP 118/60  Pulse 68  Temp(Src) 97.9 F (36.6 C) (Oral)  Resp 14  Ht 5' 8.75" (1.746 m)  Wt 183 lb 8 oz (83.235 kg)  BMI 27.30 kg/m2  SpO2 96% Physical Exam  VS noted Constitutional: Pt is oriented to person, place, and time. Appears well-developed and well-nourished.  Head: Normocephalic and atraumatic.  Right Ear: External ear normal.  Left Ear: External  ear normal.  Nose: Nose normal.  Mouth/Throat: Oropharynx is clear and moist.  Eyes: Conjunctivae and EOM are normal. Pupils are equal, round, and reactive to light.  Neck: Normal range of motion. Neck supple. No JVD present. No tracheal deviation present.  Cardiovascular: Normal rate, regular rhythm, normal heart sounds and intact distal pulses.   Pulmonary/Chest:  Effort normal and breath sounds normal.  Abdominal: Soft. Bowel sounds are normal. There is no tenderness.  Musculoskeletal: Normal range of motion. Exhibits no edema.  Lymphadenopathy:  Has no cervical adenopathy.  Neurological: Pt is alert and oriented to person, place, and time. Pt has normal reflexes. No cranial nerve deficit.  Skin: Skin is warm and dry. No rash noted.  Psychiatric:  Has  normal mood and affect. Behavior is normal.         Assessment & Plan:

## 2011-06-15 ENCOUNTER — Encounter: Payer: Self-pay | Admitting: Internal Medicine

## 2011-09-13 ENCOUNTER — Other Ambulatory Visit: Payer: Self-pay | Admitting: Dermatology

## 2012-06-12 ENCOUNTER — Other Ambulatory Visit (INDEPENDENT_AMBULATORY_CARE_PROVIDER_SITE_OTHER): Payer: Medicare Other

## 2012-06-12 DIAGNOSIS — Z0000 Encounter for general adult medical examination without abnormal findings: Secondary | ICD-10-CM

## 2012-06-12 DIAGNOSIS — Z125 Encounter for screening for malignant neoplasm of prostate: Secondary | ICD-10-CM

## 2012-06-12 DIAGNOSIS — E785 Hyperlipidemia, unspecified: Secondary | ICD-10-CM

## 2012-06-12 LAB — URINALYSIS, ROUTINE W REFLEX MICROSCOPIC
Nitrite: NEGATIVE
Specific Gravity, Urine: >=1.03 (ref 1.000–1.030)
Total Protein, Urine: NEGATIVE
Urine Glucose: NEGATIVE
pH: 5.5 (ref 5.0–8.0)

## 2012-06-12 LAB — HEPATIC FUNCTION PANEL
ALT: 20 U/L (ref 0–53)
AST: 35 U/L (ref 0–37)
Bilirubin, Direct: 0.1 mg/dL (ref 0.0–0.3)
Total Bilirubin: 1.1 mg/dL (ref 0.3–1.2)

## 2012-06-12 LAB — LIPID PANEL
HDL: 45.4 mg/dL (ref >39.00)
Total CHOL/HDL Ratio: 4
VLDL: 16.2 mg/dL (ref 0.0–40.0)

## 2012-06-12 LAB — TSH: TSH: 2.22 u[IU]/mL (ref 0.35–5.50)

## 2012-06-12 LAB — CBC WITH DIFFERENTIAL/PLATELET
Basophils Absolute: 0 10*3/uL (ref 0.0–0.1)
Eosinophils Relative: 3.1 % (ref 0.0–5.0)
Monocytes Relative: 9.3 % (ref 3.0–12.0)
Neutrophils Relative %: 49.6 % (ref 43.0–77.0)
Platelets: 195 10*3/uL (ref 150.0–400.0)
RDW: 14 % (ref 11.5–14.6)
WBC: 5.7 10*3/uL (ref 4.5–10.5)

## 2012-06-12 LAB — BASIC METABOLIC PANEL
BUN: 17 mg/dL (ref 6–23)
CO2: 26 mEq/L (ref 19–32)
Calcium: 9.3 mg/dL (ref 8.4–10.5)
GFR: 88.96 mL/min (ref >60.00)
Glucose, Bld: 89 mg/dL (ref 70–99)
Potassium: 4.1 mEq/L (ref 3.5–5.1)

## 2012-06-18 ENCOUNTER — Ambulatory Visit (INDEPENDENT_AMBULATORY_CARE_PROVIDER_SITE_OTHER): Payer: Medicare Other | Admitting: Internal Medicine

## 2012-06-18 ENCOUNTER — Encounter: Payer: Self-pay | Admitting: Internal Medicine

## 2012-06-18 VITALS — BP 112/68 | HR 74 | Temp 97.0°F | Ht 71.0 in | Wt 174.5 lb

## 2012-06-18 DIAGNOSIS — Z0000 Encounter for general adult medical examination without abnormal findings: Secondary | ICD-10-CM

## 2012-06-18 DIAGNOSIS — Z136 Encounter for screening for cardiovascular disorders: Secondary | ICD-10-CM

## 2012-06-18 DIAGNOSIS — H9202 Otalgia, left ear: Secondary | ICD-10-CM | POA: Insufficient documentation

## 2012-06-18 DIAGNOSIS — Z23 Encounter for immunization: Secondary | ICD-10-CM

## 2012-06-18 DIAGNOSIS — M1990 Unspecified osteoarthritis, unspecified site: Secondary | ICD-10-CM

## 2012-06-18 DIAGNOSIS — H9209 Otalgia, unspecified ear: Secondary | ICD-10-CM

## 2012-06-18 DIAGNOSIS — E785 Hyperlipidemia, unspecified: Secondary | ICD-10-CM

## 2012-06-18 MED ORDER — TRIAMCINOLONE ACETONIDE 0.1 % EX CREA: TOPICAL_CREAM | Freq: Two times a day (BID) | CUTANEOUS | Status: DC

## 2012-06-18 NOTE — Assessment & Plan Note (Signed)
C/w mild irriation of the canal intermittent - for triam cr prn,  to f/u any worsening symptoms or concerns

## 2012-06-18 NOTE — Assessment & Plan Note (Signed)
decliens ortho re-eval for now, may need surgury eventually

## 2012-06-18 NOTE — Addendum Note (Signed)
Addended by: Scharlene Gloss B on: 06/18/2012 11:27 AM   Modules accepted: Orders

## 2012-06-18 NOTE — Progress Notes (Signed)
Subjective:    Patient ID: Alexander Castillo, male    DOB: [birth year], 71 y.o.   MRN: [id number]  HPI  Here for wellness and f/u;  Overall doing ok;  Pt denies CP, worsening SOB, DOE, wheezing, orthopnea, PND, worsening LE edema, palpitations, dizziness or syncope.  Pt denies neurological change such as new Headache, facial or extremity weakness.  Pt denies polydipsia, polyuria, or low sugar symptoms. Pt states overall good compliance with treatment and medications, good tolerability, and trying to follow lower cholesterol diet.  Pt denies worsening depressive symptoms, suicidal ideation or panic. No fever, wt loss, night sweats, loss of appetite, or other constitutional symptoms.  Pt states good ability with ADL's, low fall risk, home safety reviewed and adequate, no significant changes in hearing or vision, and occasionally active with exercise.  Asks for shingles shot to avoid painful shingles a friends wife had recently.  Also with ongoing occasinally severe right hip pain with limited ROM, has seen orthopedic, will need surgury at some point, and he states pain not bad enough unless starts to last longer.  Spends quite a bit of time fishing which doesn't exac his pain. Right knee DJD causes decreased ROM but little pain and he copes with this as well.  Also mentions a recurrent problem with discomfort lasting 3-4 days every month or so of irritation to the left ear canal. Past Medical History  Diagnosis Date  . BENIGN PROSTATIC HYPERTROPHY, HX OF 03/10/2008  . BENIGN PROSTATIC HYPERTROPHY 03/10/2008  . CHEST PAIN 03/10/2008  . DEGENERATIVE JOINT DISEASE 03/10/2008  . EAR PAIN, LEFT 12/09/2008  . HYPERLIPIDEMIA 03/10/2008  . PSA, INCREASED 03/16/2009  . RASH-NONVESICULAR 03/16/2009  . Depression 06/14/2011   Past Surgical History  Procedure Date  . Transurethral resection of prostate   . Hip replacement left 2002  . Back surgury 2002  . S/p left knee arthroscopy     reports that he has never smoked.  He does not have any smokeless tobacco history on file. He reports that he does not drink alcohol. His drug history not on file. family history includes Dementia in his mother. No Known Allergies Current Outpatient Prescriptions on File Prior to Visit  Medication Sig Dispense Refill  . aspirin 81 MG tablet Take 81 mg by mouth daily.         Review of Systems Review of Systems  Constitutional: Negative for diaphoresis, activity change, appetite change and unexpected weight change.  HENT: Negative for hearing loss, ear pain, facial swelling, mouth sores and neck stiffness.   Eyes: Negative for pain, redness and visual disturbance.  Respiratory: Negative for shortness of breath and wheezing.   Cardiovascular: Negative for chest pain and palpitations.  Gastrointestinal: Negative for diarrhea, blood in stool, abdominal distention and rectal pain.  Genitourinary: Negative for hematuria, flank pain and decreased urine volume.  Musculoskeletal: Negative for myalgias and joint swelling.  Skin: Negative for color change and wound.  Neurological: Negative for syncope and numbness.  Hematological: Negative for adenopathy.  Psychiatric/Behavioral: Negative for hallucinations, self-injury, decreased concentration and agitation.      Objective:   Physical Exam BP 112/68  Pulse 74  Temp 97 F (36.1 C) (Oral)  Ht 5\' 11"  (1.803 m)  Wt 174 lb 8 oz (79.153 kg)  BMI 24.34 kg/m2  SpO2 97% Physical Exam  VS noted Constitutional: Pt is oriented to person, place, and time. Appears well-developed and well-nourished.  HENT:  Head: Normocephalic and atraumatic.  Right Ear: External ear normal.  Left Ear: External ear normal. ; Bilat canals clear today Nose: Nose normal.  Mouth/Throat: Oropharynx is clear and moist.  Eyes: Conjunctivae and EOM are normal. Pupils are equal, round, and reactive to light.  Neck: Normal range of motion. Neck supple. No JVD present. No tracheal deviation present.    Cardiovascular: Normal rate, regular rhythm, normal heart sounds and intact distal pulses.   Pulmonary/Chest: Effort normal and breath sounds normal.  Abdominal: Soft. Bowel sounds are normal. There is no tenderness.  Musculoskeletal: Normal range of motion. Exhibits no edema.  Lymphadenopathy:  Has no cervical adenopathy.  Neurological: Pt is alert and oriented to person, place, and time. Pt has normal reflexes. No cranial nerve deficit.  Skin: Skin is warm and dry. No rash noted.  Psychiatric:  Has  normal mood and affect. Behavior is normal.  Right knee bony deg change noted, NT, no effusion, but limited on complete extension by about 10 degrees Right hip with little pain but marked decrease ROM to int roation - minimal at best, but better ext rotation to about 30 degrees    Assessment & Plan:

## 2012-06-18 NOTE — Patient Instructions (Addendum)
You had the shingles shot today Take all new medications as prescribed - the steroid cream for the left ear was sent to the [facility] Continue all other medications as before, including the aspirin and fish oil No other new medications needed today Please call if you need referral back to orthopedic for the right hip, or the right knee if it becomes worse Please return in 1 year for your yearly visit, or sooner if needed, with Lab testing done 3-5 days before

## 2012-06-18 NOTE — Assessment & Plan Note (Signed)
D/w pt -  Lab Results  Component Value Date   LDLCALC 114* 06/12/2012   stable overall by hx and exam, most recent data reviewed with pt, and pt to continue medical treatment as before - declines low dose statin, to cont diet

## 2012-06-18 NOTE — Assessment & Plan Note (Addendum)
Overall doing well, age appropriate education and counseling updated, referrals for preventative services and immunizations addressed, dietary and smoking counseling addressed, most recent labs and ECG reviewed.  I have personally reviewed and have noted: 1) the patient's medical and social history 2) The pt's use of alcohol, tobacco, and illicit drugs 3) The patient's current medications and supplements 4) Functional ability including ADL's, fall risk, home safety risk, hearing and visual impairment 5) Diet and physical activities 6) Evidence for depression or mood disorder 7) The patient's height, weight, and BMI have been recorded in the chart I have made referrals, and provided counseling and education based on review of the above For shingles shot today, ECG reviewed as per emr

## 2013-05-21 ENCOUNTER — Ambulatory Visit (INDEPENDENT_AMBULATORY_CARE_PROVIDER_SITE_OTHER): Payer: Medicare Other | Admitting: Internal Medicine

## 2013-05-21 ENCOUNTER — Encounter: Payer: Self-pay | Admitting: Internal Medicine

## 2013-05-21 VITALS — BP 120/82 | HR 88 | Temp 97.9°F | Ht 70.0 in | Wt 174.2 lb

## 2013-05-21 DIAGNOSIS — L738 Other specified follicular disorders: Secondary | ICD-10-CM

## 2013-05-21 DIAGNOSIS — L739 Follicular disorder, unspecified: Secondary | ICD-10-CM | POA: Insufficient documentation

## 2013-05-21 DIAGNOSIS — L0231 Cutaneous abscess of buttock: Secondary | ICD-10-CM | POA: Insufficient documentation

## 2013-05-21 DIAGNOSIS — L03317 Cellulitis of buttock: Secondary | ICD-10-CM

## 2013-05-21 DIAGNOSIS — F329 Major depressive disorder, single episode, unspecified: Secondary | ICD-10-CM

## 2013-05-21 MED ORDER — DOXYCYCLINE HYCLATE 100 MG PO TABS: 100.0000 mg | ORAL_TABLET | Freq: Two times a day (BID) | ORAL | Status: DC

## 2013-05-21 NOTE — Assessment & Plan Note (Signed)
stable overall by history and exam, recent data reviewed with pt, and pt to continue medical treatment as before,  to f/u any worsening symptoms or concerns Lab Results  Component Value Date   WBC 5.7 06/12/2012   HGB 15.1 06/12/2012   HCT 46.0 06/12/2012   PLT 195.0 06/12/2012   GLUCOSE 89 06/12/2012   CHOL 176 06/12/2012   TRIG 81.0 06/12/2012   HDL 45.40 06/12/2012   LDLCALC 114* 06/12/2012   ALT 20 06/12/2012   AST 35 06/12/2012   NA 141 06/12/2012   K 4.1 06/12/2012   CL 109 06/12/2012   CREATININE 0.9 06/12/2012   BUN 17 06/12/2012   CO2 26 06/12/2012   TSH 2.22 06/12/2012   PSA 2.29 06/12/2012

## 2013-05-21 NOTE — Assessment & Plan Note (Signed)
Mild to mod, for antibx course,  to f/u any worsening symptoms or concerns 

## 2013-05-21 NOTE — Progress Notes (Signed)
  Subjective:    Patient ID: Alexander Castillo, male    DOB: 05-24-1941, 72 y.o.   MRN: [id number]  HPI  Here with acute onset mild to mod red/tender/sweling to left buttock, after numerous smaller lesions tender seems to come and go to extremities and buttocks, now ongoing for 2 mo with current lesion worse for 1 wk. No fever, drainage, prior hx, known mrsa.  Denies worsening depressive symptoms, suicidal ideation, or panic.   Past Medical History  Diagnosis Date  . BENIGN PROSTATIC HYPERTROPHY, HX OF 03/10/2008  . BENIGN PROSTATIC HYPERTROPHY 03/10/2008  . CHEST PAIN 03/10/2008  . DEGENERATIVE JOINT DISEASE 03/10/2008  . EAR PAIN, LEFT 12/09/2008  . HYPERLIPIDEMIA 03/10/2008  . PSA, INCREASED 03/16/2009  . RASH-NONVESICULAR 03/16/2009  . Depression 06/14/2011   Past Surgical History  Procedure Laterality Date  . Transurethral resection of prostate    . Hip replacement left  2002  . Back surgury  2002  . S/p left knee arthroscopy      reports that he has never smoked. He does not have any smokeless tobacco history on file. He reports that he does not drink alcohol. His drug history is not on file. family history includes Dementia in his mother. No Known Allergies Current Outpatient Prescriptions on File Prior to Visit  Medication Sig Dispense Refill  . aspirin 81 MG tablet Take 81 mg by mouth daily.         No current facility-administered medications on file prior to visit.   Review of Systems  Constitutional: Negative for unexpected weight change, or unusual diaphoresis  HENT: Negative for tinnitus.   Eyes: Negative for photophobia and visual disturbance.  Respiratory: Negative for choking and stridor.   Gastrointestinal: Negative for vomiting and blood in stool.  Genitourinary: Negative for hematuria and decreased urine volume.  Musculoskeletal: Negative for acute joint swelling Skin: Negative for color change and wound.  Neurological: Negative for tremors and numbness other than  noted  Psychiatric/Behavioral: Negative for decreased concentration or  hyperactivity.       Objective:   Physical Exam BP 120/82  Pulse 88  Temp(Src) 97.9 F (36.6 C) (Oral)  Ht 5\' 10"  (1.778 m)  Wt 174 lb 4 oz (79.039 kg)  BMI 25 kg/m2  SpO2 97% VS noted,  Constitutional: Pt appears well-developed and well-nourished.  HENT: Head: NCAT.  Right Ear: External ear normal.  Left Ear: External ear normal.  Eyes: Conjunctivae and EOM are normal. Pupils are equal, round, and reactive to light.  Neck: Normal range of motion. Neck supple.  Cardiovascular: Normal rate and regular rhythm.   Pulmonary/Chest: Effort normal and breath sounds normal.  Neurological: Pt is alert. Not confused  Skin: left buttock over left ischium with 1.5 cm area red/tender/swelling/induration but nonfluctuant/no drainage., numerous follicular like lesions to legs Psychiatric: Pt behavior is normal. Thought content normal. not nervous appearing today, not depressed affect    Assessment & Plan:

## 2013-05-21 NOTE — Patient Instructions (Signed)
Please take all new medication as prescribed Please continue all other medications as before Please have the pharmacy call with any other refills you may need.   

## 2013-06-19 ENCOUNTER — Ambulatory Visit (INDEPENDENT_AMBULATORY_CARE_PROVIDER_SITE_OTHER): Payer: Medicare Other | Admitting: Internal Medicine

## 2013-06-19 ENCOUNTER — Other Ambulatory Visit (INDEPENDENT_AMBULATORY_CARE_PROVIDER_SITE_OTHER): Payer: Medicare Other

## 2013-06-19 ENCOUNTER — Encounter: Payer: Self-pay | Admitting: Internal Medicine

## 2013-06-19 VITALS — BP 138/88 | HR 77 | Temp 98.0°F | Ht 70.0 in | Wt 174.0 lb

## 2013-06-19 DIAGNOSIS — Z0000 Encounter for general adult medical examination without abnormal findings: Secondary | ICD-10-CM

## 2013-06-19 DIAGNOSIS — Z136 Encounter for screening for cardiovascular disorders: Secondary | ICD-10-CM

## 2013-06-19 DIAGNOSIS — E785 Hyperlipidemia, unspecified: Secondary | ICD-10-CM

## 2013-06-19 DIAGNOSIS — N400 Enlarged prostate without lower urinary tract symptoms: Secondary | ICD-10-CM

## 2013-06-19 LAB — HEPATIC FUNCTION PANEL
AST: 34 U/L (ref 0–37)
Albumin: 4.1 g/dL (ref 3.5–5.2)
Alkaline Phosphatase: 58 U/L (ref 39–117)
Bilirubin, Direct: 0.1 mg/dL (ref 0.0–0.3)

## 2013-06-19 LAB — CBC WITH DIFFERENTIAL/PLATELET
Basophils Absolute: 0 10*3/uL (ref 0.0–0.1)
Eosinophils Absolute: 0.1 10*3/uL (ref 0.0–0.7)
HCT: 43.7 % (ref 39.0–52.0)
Hemoglobin: 14.8 g/dL (ref 13.0–17.0)
Lymphs Abs: 2 10*3/uL (ref 0.7–4.0)
MCHC: 33.8 g/dL (ref 30.0–36.0)
MCV: 91.4 fl (ref 78.0–100.0)
Monocytes Absolute: 0.7 10*3/uL (ref 0.1–1.0)
Monocytes Relative: 9 % (ref 3.0–12.0)
Neutro Abs: 4.5 10*3/uL (ref 1.4–7.7)
Platelets: 207 10*3/uL (ref 150.0–400.0)
RDW: 13.3 % (ref 11.5–14.6)

## 2013-06-19 LAB — BASIC METABOLIC PANEL
Calcium: 9.4 mg/dL (ref 8.4–10.5)
GFR: 100.32 mL/min (ref >60.00)
Glucose, Bld: 90 mg/dL (ref 70–99)
Potassium: 4 mEq/L (ref 3.5–5.1)
Sodium: 140 mEq/L (ref 135–145)

## 2013-06-19 LAB — TSH: TSH: 1.6 u[IU]/mL (ref 0.35–5.50)

## 2013-06-19 LAB — URINALYSIS, ROUTINE W REFLEX MICROSCOPIC
Bilirubin Urine: NEGATIVE
Ketones, ur: NEGATIVE
Nitrite: NEGATIVE
RBC / HPF: NONE SEEN (ref 0–?)
Specific Gravity, Urine: 1.01 (ref 1.000–1.030)
Total Protein, Urine: NEGATIVE
pH: 6 (ref 5.0–8.0)

## 2013-06-19 LAB — LIPID PANEL
Total CHOL/HDL Ratio: 4
VLDL: 37 mg/dL (ref 0.0–40.0)

## 2013-06-19 LAB — PSA: PSA: 2.69 ng/mL (ref 0.10–4.00)

## 2013-06-19 NOTE — Patient Instructions (Signed)
Please continue all other medications as before  Please continue your efforts at being more active, low cholesterol diet, and weight control.  You are otherwise up to date with prevention measures today.  Please go to the LAB in the Basement (turn left off the elevator) for the tests to be done today  You will be contacted by phone if any changes need to be made immediately.  Otherwise, you will receive a letter about your results with an explanation, but please check with MyChart first.  Please return in 1 year for your yearly visit, or sooner if needed, with Lab testing done 3-5 days before

## 2013-06-19 NOTE — Assessment & Plan Note (Signed)

## 2013-06-19 NOTE — Progress Notes (Signed)
Subjective:    Patient ID: Alexander Castillo, male    DOB: 11-23-1941, 72 y.o.   MRN: [id number]  HPI  Here for wellness and f/u;  Overall doing ok;  Pt denies CP, worsening SOB, DOE, wheezing, orthopnea, PND, worsening LE edema, palpitations, dizziness or syncope.  Pt denies neurological change such as new headache, facial or extremity weakness.  Pt denies polydipsia, polyuria, or low sugar symptoms. Pt states overall good compliance with treatment and medications, good tolerability, and has been trying to follow lower cholesterol diet.  Pt denies worsening depressive symptoms, suicidal ideation or panic. No fever, night sweats, wt loss, loss of appetite, or other constitutional symptoms.  Pt states good ability with ADL's, has low fall risk, home safety reviewed and adequate, no other significant changes in hearing or vision, and only occasionally active with exercise. C/o left knee aching on occasion, but not too severe for now, and has been putting off left knee TKR and right hip replacement with DJD Past Medical History  Diagnosis Date  . BENIGN PROSTATIC HYPERTROPHY, HX OF 03/10/2008  . BENIGN PROSTATIC HYPERTROPHY 03/10/2008  . CHEST PAIN 03/10/2008  . DEGENERATIVE JOINT DISEASE 03/10/2008  . EAR PAIN, LEFT 12/09/2008  . HYPERLIPIDEMIA 03/10/2008  . PSA, INCREASED 03/16/2009  . RASH-NONVESICULAR 03/16/2009  . Depression 06/14/2011   Past Surgical History  Procedure Laterality Date  . Transurethral resection of prostate    . Hip replacement left  2002  . Back surgury  2002  . S/p left knee arthroscopy      reports that he has never smoked. He does not have any smokeless tobacco history on file. He reports that he does not drink alcohol. His drug history is not on file. family history includes Dementia in his mother. No Known Allergies Current Outpatient Prescriptions on File Prior to Visit  Medication Sig Dispense Refill  . aspirin 81 MG tablet Take 81 mg by mouth daily.         No current  facility-administered medications on file prior to visit.   Review of Systems Constitutional: Negative for diaphoresis, activity change, appetite change or unexpected weight change.  HENT: Negative for hearing loss, ear pain, facial swelling, mouth sores and neck stiffness.   Eyes: Negative for pain, redness and visual disturbance.  Respiratory: Negative for shortness of breath and wheezing.   Cardiovascular: Negative for chest pain and palpitations.  Gastrointestinal: Negative for diarrhea, blood in stool, abdominal distention or other pain Genitourinary: Negative for hematuria, flank pain or change in urine volume.  Musculoskeletal: Negative for myalgias and joint swelling.  Skin: Negative for color change and wound.  Neurological: Negative for syncope and numbness. other than noted Hematological: Negative for adenopathy.  Psychiatric/Behavioral: Negative for hallucinations, self-injury, decreased concentration and agitation.      Obtive:   Physical Exam BP 138/88  Pulse 77  Temp(Src) 98 F (36.7 C) (Oral)  Ht 5\' 10"  (1.778 m)  Wt 174 lb (78.926 kg)  BMI 24.97 kg/m2  SpO2 97% VS noted,  Constitutional: Pt is oriented to person, place, and time. Appears well-developed and well-nourished.  Head: Normocephalic and atraumatic.  Right Ear: External ear normal.  Left Ear: External ear normal.  Nose: Nose normal.  Mouth/Throat: Oropharynx is clear and moist.  Eyes: Conjunctivae and EOM are normal. Pupils are equal, round, and reactive to light.  Neck: Normal range of motion. Neck supple. No JVD present. No tracheal deviation present.  Cardiovascular: Normal rate, regular rhythm, normal heart sounds and intact distal  pulses.   Pulmonary/Chest: Effort normal and breath sounds normal.  Abdominal: Soft. Bowel sounds are normal. There is no tenderness. No HSM  Musculoskeletal: Normal range of motion. Exhibits no edema.  Lymphadenopathy:  Has no cervical adenopathy.  Neurological: Pt is  alert and oriented to person, place, and time. Pt has normal reflexes. No cranial nerve deficit.  Skin: Skin is warm and dry. No rash noted.  Psychiatric:  Has  normal mood and affect. Behavior is normal.      Assessment & Plan:

## 2013-10-17 ENCOUNTER — Other Ambulatory Visit: Payer: Self-pay

## 2014-03-27 ENCOUNTER — Other Ambulatory Visit: Payer: Self-pay | Admitting: Dermatology

## 2014-04-14 ENCOUNTER — Other Ambulatory Visit: Payer: Self-pay | Admitting: Dermatology

## 2014-06-23 ENCOUNTER — Encounter: Payer: Medicare Other | Admitting: Internal Medicine

## 2014-06-25 ENCOUNTER — Encounter: Payer: Medicare Other | Admitting: Internal Medicine

## 2014-06-30 ENCOUNTER — Other Ambulatory Visit (INDEPENDENT_AMBULATORY_CARE_PROVIDER_SITE_OTHER): Payer: Medicare Other

## 2014-06-30 DIAGNOSIS — Z0000 Encounter for general adult medical examination without abnormal findings: Secondary | ICD-10-CM

## 2014-06-30 DIAGNOSIS — N400 Enlarged prostate without lower urinary tract symptoms: Secondary | ICD-10-CM

## 2014-06-30 DIAGNOSIS — E785 Hyperlipidemia, unspecified: Secondary | ICD-10-CM

## 2014-06-30 LAB — URINALYSIS, ROUTINE W REFLEX MICROSCOPIC
Bilirubin Urine: NEGATIVE
Hgb urine dipstick: NEGATIVE
KETONES UR: NEGATIVE
LEUKOCYTES UA: NEGATIVE
NITRITE: NEGATIVE
PH: 5.5 (ref 5.0–8.0)
RBC / HPF: NONE SEEN (ref 0–?)
SPECIFIC GRAVITY, URINE: 1.02 (ref 1.000–1.030)
Total Protein, Urine: NEGATIVE
Urine Glucose: NEGATIVE
Urobilinogen, UA: 0.2 (ref 0.0–1.0)

## 2014-06-30 LAB — BASIC METABOLIC PANEL
BUN: 17 mg/dL (ref 6–23)
CHLORIDE: 107 meq/L (ref 96–112)
CO2: 31 meq/L (ref 19–32)
CREATININE: 0.9 mg/dL (ref 0.4–1.5)
Calcium: 9.4 mg/dL (ref 8.4–10.5)
GFR: 93.28 mL/min (ref >60.00)
Glucose, Bld: 82 mg/dL (ref 70–99)
Potassium: 4.4 mEq/L (ref 3.5–5.1)
Sodium: 141 mEq/L (ref 135–145)

## 2014-06-30 LAB — HEPATIC FUNCTION PANEL
ALBUMIN: 4 g/dL (ref 3.5–5.2)
ALK PHOS: 44 U/L (ref 39–117)
ALT: 19 U/L (ref 0–53)
AST: 33 U/L (ref 0–37)
BILIRUBIN DIRECT: 0.1 mg/dL (ref 0.0–0.3)
BILIRUBIN TOTAL: 0.7 mg/dL (ref 0.2–1.2)
Total Protein: 6.5 g/dL (ref 6.0–8.3)

## 2014-06-30 LAB — LIPID PANEL
CHOL/HDL RATIO: 3
Cholesterol: 162 mg/dL (ref 0–200)
HDL: 47.2 mg/dL (ref >39.00)
LDL Cholesterol: 103 mg/dL — ABNORMAL HIGH (ref 0–99)
NONHDL: 114.8
Triglycerides: 60 mg/dL (ref 0.0–149.0)
VLDL: 12 mg/dL (ref 0.0–40.0)

## 2014-06-30 LAB — CBC WITH DIFFERENTIAL/PLATELET
BASOS ABS: 0 10*3/uL (ref 0.0–0.1)
Basophils Relative: 0.4 % (ref 0.0–3.0)
EOS ABS: 0.1 10*3/uL (ref 0.0–0.7)
Eosinophils Relative: 1.6 % (ref 0.0–5.0)
HCT: 44.7 % (ref 39.0–52.0)
Hemoglobin: 14.8 g/dL (ref 13.0–17.0)
LYMPHS PCT: 23.5 % (ref 12.0–46.0)
Lymphs Abs: 1.3 10*3/uL (ref 0.7–4.0)
MCHC: 33.2 g/dL (ref 30.0–36.0)
MCV: 92.7 fl (ref 78.0–100.0)
Monocytes Absolute: 0.4 10*3/uL (ref 0.1–1.0)
Monocytes Relative: 7.6 % (ref 3.0–12.0)
NEUTROS PCT: 66.9 % (ref 43.0–77.0)
Neutro Abs: 3.7 10*3/uL (ref 1.4–7.7)
Platelets: 233 10*3/uL (ref 150.0–400.0)
RBC: 4.82 Mil/uL (ref 4.22–5.81)
RDW: 13.8 % (ref 11.5–15.5)
WBC: 5.6 10*3/uL (ref 4.0–10.5)

## 2014-06-30 LAB — PSA: PSA: 2.76 ng/mL (ref 0.10–4.00)

## 2014-06-30 LAB — TSH: TSH: 2.6 u[IU]/mL (ref 0.35–4.50)

## 2014-07-03 ENCOUNTER — Encounter: Payer: Self-pay | Admitting: Internal Medicine

## 2014-07-03 ENCOUNTER — Ambulatory Visit (INDEPENDENT_AMBULATORY_CARE_PROVIDER_SITE_OTHER): Payer: Medicare Other | Admitting: Internal Medicine

## 2014-07-03 VITALS — BP 118/68 | HR 68 | Temp 97.8°F | Ht 68.0 in | Wt 170.8 lb

## 2014-07-03 DIAGNOSIS — Z0000 Encounter for general adult medical examination without abnormal findings: Secondary | ICD-10-CM

## 2014-07-03 DIAGNOSIS — Z136 Encounter for screening for cardiovascular disorders: Secondary | ICD-10-CM

## 2014-07-03 DIAGNOSIS — Z23 Encounter for immunization: Secondary | ICD-10-CM

## 2014-07-03 NOTE — Progress Notes (Signed)
Subjective:    Patient ID: Alexander Castillo, male    DOB: 06-22-1941, 73 y.o.   MRN: [id number]  HPI  Here for wellness and f/u;  Overall doing ok;  Pt denies CP, worsening SOB, DOE, wheezing, orthopnea, PND, worsening LE edema, palpitations, dizziness or syncope.  Pt denies neurological change such as new headache, facial or extremity weakness.  Pt denies polydipsia, polyuria, or low sugar symptoms. Pt states overall good compliance with treatment and medications, good tolerability, and has been trying to follow lower cholesterol diet.  Pt denies worsening depressive symptoms, suicidal ideation or panic. No fever, night sweats, wt loss, loss of appetite, or other constitutional symptoms.  Pt states good ability with ADL's, has low fall risk, home safety reviewed and adequate, no other significant changes in hearing or vision, and only occasionally active with exercise.  No current complaints Past Medical History  Diagnosis Date  . BENIGN PROSTATIC HYPERTROPHY, HX OF 03/10/2008  . BENIGN PROSTATIC HYPERTROPHY 03/10/2008  . CHEST PAIN 03/10/2008  . DEGENERATIVE JOINT DISEASE 03/10/2008  . EAR PAIN, LEFT 12/09/2008  . HYPERLIPIDEMIA 03/10/2008  . PSA, INCREASED 03/16/2009  . RASH-NONVESICULAR 03/16/2009  . Depression 06/14/2011   Past Surgical History  Procedure Laterality Date  . Transurethral resection of prostate    . Hip replacement left  2002  . Back surgury  2002  . S/p left knee arthroscopy      reports that he has never smoked. He does not have any smokeless tobacco history on file. He reports that he does not drink alcohol. His drug history is not on file. family history includes Dementia in his mother. No Known Allergies , Current Outpatient Prescriptions on File Prior to Visit  Medication Sig Dispense Refill  . aspirin 81 MG tablet Take 81 mg by mouth daily.         No current facility-administered medications on file prior to visit.   Review of Systems Constitutional: Negative for  increased diaphoresis, other activity, appetite or other siginficant weight change  HENT: Negative for worsening hearing loss, ear pain, facial swelling, mouth sores and neck stiffness.   Eyes: Negative for other worsening pain, redness or visual disturbance.  Respiratory: Negative for shortness of breath and wheezing.   Cardiovascular: Negative for chest pain and palpitations.  Gastrointestinal: Negative for diarrhea, blood in stool, abdominal distention or other pain Genitourinary: Negative for hematuria, flank pain or change in urine volume.  Musculoskeletal: Negative for myalgias or other joint complaints.  Skin: Negative for color change and wound.  Neurological: Negative for syncope and numbness. other than noted Hematological: Negative for adenopathy. or other swelling Psychiatric/Behavioral: Negative for hallucinations, self-injury, decreased concentration or other worsening agitation.      Objective:   Physical Exam BP 118/68  Pulse 68  Temp(Src) 97.8 F (36.6 C) (Oral)  Ht 5\' 8"  (1.727 m)  Wt 170 lb 12 oz (77.452 kg)  BMI 25.97 kg/m2  SpO2 97% VS noted,  Constitutional: Pt is oriented to person, place, and time. Appears well-developed and well-nourished.  Head: Normocephalic and atraumatic.  Right Ear: External ear normal.  Left Ear: External ear normal.  Nose: Nose normal.  Mouth/Throat: Oropharynx is clear and moist.  Eyes: Conjunctivae and EOM are normal. Pupils are equal, round, and reactive to light.  Neck: Normal range of motion. Neck supple. No JVD present. No tracheal deviation present.  Cardiovascular: Normal rate, regular rhythm, normal heart sounds and intact distal pulses.   Pulmonary/Chest: Effort normal and breath sounds  without rales or wheezing  Abdominal: Soft. Bowel sounds are normal. NT. No HSM  Musculoskeletal: Normal range of motion. Exhibits no edema.  Lymphadenopathy:  Has no cervical adenopathy.  Neurological: Pt is alert and oriented to person,  place, and time. Pt has normal reflexes. No cranial nerve deficit. Motor grossly intact Skin: Skin is warm and dry. No rash noted.  Psychiatric:  Has normal mood and affect. Behavior is normal.     Assessment & Plan:

## 2014-07-03 NOTE — Patient Instructions (Addendum)
You had the new Prevnar pneumonia shot today  Please continue all other medications as before, and refills have been done if requested.  Please have the pharmacy call with any other refills you may need.  Please continue your efforts at being more active, low cholesterol diet, and weight control.  You are otherwise up to date with prevention measures today.  Please keep your appointments with your specialists as you may have planned  Please return in 1 year for your yearly visit, or sooner if needed, with Lab testing done 3-5 days before

## 2014-07-03 NOTE — Progress Notes (Signed)
Pre visit review using our clinic review tool, if applicable. No additional management support is needed unless otherwise documented below in the visit note. 

## 2014-07-03 NOTE — Assessment & Plan Note (Signed)

## 2015-02-23 ENCOUNTER — Encounter: Payer: Self-pay | Admitting: Nurse Practitioner

## 2015-02-23 ENCOUNTER — Ambulatory Visit (INDEPENDENT_AMBULATORY_CARE_PROVIDER_SITE_OTHER): Payer: Medicare Other | Admitting: Nurse Practitioner

## 2015-02-23 VITALS — BP 100/64 | HR 78 | Temp 97.5°F | Ht 68.0 in | Wt 174.0 lb

## 2015-02-23 DIAGNOSIS — H8111 Benign paroxysmal vertigo, right ear: Secondary | ICD-10-CM

## 2015-02-23 DIAGNOSIS — H8110 Benign paroxysmal vertigo, unspecified ear: Secondary | ICD-10-CM | POA: Insufficient documentation

## 2015-02-23 NOTE — Progress Notes (Signed)
Pre visit review using our clinic review tool, if applicable. No additional management support is needed unless otherwise documented below in the visit note. 

## 2015-02-23 NOTE — Patient Instructions (Signed)
Perform Epley's maneuver (name of position that you did in office) at home as needed. Stay in position until dizzy sensation is completely gone. Repeat as needed. Let us know if you have concerns.   Benign Positional Vertigo Vertigo means you feel like you or your surroundings are moving when they are not. Benign positional vertigo is the most common form of vertigo. Benign means that the cause of your condition is not serious. Benign positional vertigo is more common in older adults. CAUSES  Benign positional vertigo is the result of an upset in the labyrinth system. This is an area in the middle ear that helps control your balance. This may be caused by a viral infection, head injury, or repetitive motion. However, often no specific cause is found. SYMPTOMS  Symptoms of benign positional vertigo occur when you move your head or eyes in different directions. Some of the symptoms may include:  Loss of balance and falls.  Vomiting.  Blurred vision.  Dizziness.  Nausea.  Involuntary eye movements (nystagmus). DIAGNOSIS  Benign positional vertigo is usually diagnosed by physical exam. If the specific cause of your benign positional vertigo is unknown, your caregiver may perform imaging tests, such as magnetic resonance imaging (MRI) or computed tomography (CT). TREATMENT  Your caregiver may recommend movements or procedures to correct the benign positional vertigo. Medicines such as meclizine, benzodiazepines, and medicines for nausea may be used to treat your symptoms. In rare cases, if your symptoms are caused by certain conditions that affect the inner ear, you may need surgery. HOME CARE INSTRUCTIONS   Follow your caregiver's instructions.  Move slowly. Do not make sudden body or head movements.  Avoid driving.  Avoid operating heavy machinery.  Avoid performing any tasks that would be dangerous to you or others during a vertigo episode.  Drink enough fluids to keep your urine  clear or pale yellow. SEEK IMMEDIATE MEDICAL CARE IF:   You develop problems with walking, weakness, numbness, or using your arms, hands, or legs.  You have difficulty speaking.  You develop severe headaches.  Your nausea or vomiting continues or gets worse.  You develop visual changes.  Your family or friends notice any behavioral changes.  Your condition gets worse.  You have a fever.  You develop a stiff neck or sensitivity to light. MAKE SURE YOU:   Understand these instructions.  Will watch your condition.  Will get help right away if you are not doing well or get worse. Document Released: 09/05/2006 Document Revised: 02/20/2012 Document Reviewed: 08/18/2011 [facility] Patient Information 2015 [place], [place]. This information is not intended to replace advice given to you by your health care provider. Make sure you discuss any questions you have with your health care provider.

## 2015-02-23 NOTE — Progress Notes (Signed)
   Subjective:    Patient ID: Alexander Castillo, male    DOB: [birth year], 74 y.o.   MRN: [id number]  Dizziness This is a new problem. The current episode started 1 to 4 weeks ago (2 weeks). The problem occurs intermittently. Pertinent negatives include no chest pain, coughing, diaphoresis, fatigue, fever, headaches, nausea, neck pain, visual change or weakness. Associated symptoms comments: Denies SOB, URI symptoms, hearing loss. The symptoms are aggravated by bending (turning head, rolling over in bed). He has tried nothing for the symptoms.      Review of Systems  Constitutional: Negative for fever, diaphoresis and fatigue.  Respiratory: Negative for cough.   Cardiovascular: Negative for chest pain.  Gastrointestinal: Negative for nausea.  Musculoskeletal: Negative for neck pain.  Neurological: Positive for dizziness. Negative for weakness and headaches.       Objective:   Physical Exam  Constitutional: He is oriented to person, place, and time. He appears well-developed and well-nourished. No distress.  HENT:  Head: Normocephalic and atraumatic.  Right Ear: External ear normal.  Left Ear: External ear normal.  Mouth/Throat: Oropharyngeal exudate present.  Upper dentures in place Pos Dix-Halpike Maneuver R eye nystagmus Resolved w/Epley's maneuver in ofc  Eyes: Conjunctivae are normal. Right eye exhibits no discharge. Left eye exhibits no discharge.  Neck: Neck supple.  Slight decrease ROm w/lateral rotation  Cardiovascular: Normal rate, regular rhythm and normal heart sounds.   No murmur heard. Pulmonary/Chest: Effort normal and breath sounds normal.  Musculoskeletal:  Mild kyphosis  Neurological: He is oriented to person, place, and time.  Skin: Skin is warm and dry.  Psychiatric: He has a normal mood and affect. His behavior is normal. Thought content normal.  Vitals reviewed.         Assessment & Plan:  1. BPPV (benign paroxysmal positional vertigo),  right Continue Epley's maneuver as needed & demonstrated F/u PRN

## 2015-03-12 ENCOUNTER — Telehealth: Payer: Self-pay | Admitting: Internal Medicine

## 2015-03-12 NOTE — Telephone Encounter (Signed)
Patient states he seen Dr. Doug Sou a couple weeks ago for vertigo.  He states it has not gotten any better and that Dr. Doug Sou would refer him to physical therapy if not any better.  He is requesting a referral.

## 2015-03-12 NOTE — Telephone Encounter (Signed)
Will forward to L Weaverm who saw this pt.

## 2015-03-12 NOTE — Telephone Encounter (Signed)
Would appear that he saw Dr. Jenny Reichmann, will forward.

## 2015-03-12 NOTE — Telephone Encounter (Signed)
Would you like to send this patient for PT?

## 2015-03-16 ENCOUNTER — Other Ambulatory Visit: Payer: Self-pay | Admitting: Nurse Practitioner

## 2015-03-16 DIAGNOSIS — H8111 Benign paroxysmal vertigo, right ear: Secondary | ICD-10-CM

## 2015-03-16 NOTE — Telephone Encounter (Signed)
pls call pt: Advise I ref to PT. Hopefully he will get call for appt this week. He should f/u w/ Dr Jenny Reichmann if concerns.

## 2015-03-16 NOTE — Telephone Encounter (Signed)
Called and informed patient. 

## 2015-04-03 ENCOUNTER — Ambulatory Visit: Payer: Medicare Other | Attending: Nurse Practitioner | Admitting: Rehabilitative and Restorative Service Providers"

## 2015-04-03 DIAGNOSIS — H8111 Benign paroxysmal vertigo, right ear: Secondary | ICD-10-CM

## 2015-04-03 DIAGNOSIS — R269 Unspecified abnormalities of gait and mobility: Secondary | ICD-10-CM | POA: Diagnosis not present

## 2015-04-03 NOTE — Therapy (Signed)
[facility] [address] [place], [place], [address] Phone: [phone]   Fax:  [phone]  Physical Therapy Evaluation  Patient Details  Name: Alexander Castillo MRN: [id number] Date of Birth: 1941/03/21 Referring Provider:  Irene Pap, NP  Encounter Date: 04/03/2015      PT End of Session - 04/03/15 1213    Visit Number 1   Number of Visits 4   Date for PT Re-Evaluation May 13, 2015   Authorization Type G codes every 10th visit   PT Start Time 0800   PT Stop Time 0850   PT Time Calculation (min) 50 min   Activity Tolerance Patient tolerated treatment well   Behavior During Therapy [facility] for tasks assessed/performed      Past Medical History  Diagnosis Date  . BENIGN PROSTATIC HYPERTROPHY, HX OF 03/10/2008  . BENIGN PROSTATIC HYPERTROPHY 03/10/2008  . CHEST PAIN 03/10/2008  . DEGENERATIVE JOINT DISEASE 03/10/2008  . EAR PAIN, LEFT 12/09/2008  . HYPERLIPIDEMIA 03/10/2008  . PSA, INCREASED 03/16/2009  . RASH-NONVESICULAR 03/16/2009  . Depression 06/14/2011    Past Surgical History  Procedure Laterality Date  . Transurethral resection of prostate    . Hip replacement left  2002  . Back surgury  2002  . S/p left knee arthroscopy      There were no vitals filed for this visit.  Visit Diagnosis:  BPPV (benign paroxysmal positional vertigo), right  Abnormality of gait      Subjective Assessment - 04/03/15 0806    Subjective The patient reports sudden onset of room spinning sensation 6 weeks ago after bending over.  The sensation of room spinning last for seconds and he notes unsteadiness associated with symptoms.  He denies nausea, headaches or hearing changes.   Patient Stated Goals Reduce dizziness.   Currently in Pain? No/denies            [facility] PT Assessment - 04/03/15 3710    Assessment   Medical Diagnosis R BPPV   Onset Date --  02/2015   Balance Screen   Has the patient fallen in the past 6 months  No   Has the patient had a decrease in activity level because of a fear of falling?  No   Is the patient reluctant to leave their home because of a fear of falling?  No   Home Environment   Living Enviornment Private residence   Type of [place] Access Stairs to enter   Entrance Stairs-Number of Steps --  2   Home Layout One level            Vestibular Assessment - 04/03/15 0809    Symptom Behavior   Type of Dizziness Spinning   Frequency of Dizziness --  daily   Duration of Dizziness seconds   Aggravating Factors --  bending over and putting head down   Relieving Factors Head stationary   Positional Testing   Dix-Hallpike Dix-Hallpike Right;Dix-Hallpike Left   Sidelying Test Sidelying Right;Sidelying Left   Horizontal Canal Testing Horizontal Canal Right;Horizontal Canal Left   Dix-Hallpike Right   Dix-Hallpike Right Symptoms No nystagmus  mild symptom of "fuzziness"   Dix-Hallpike Left   Dix-Hallpike Left Symptoms No nystagmus   Sidelying Right   Sidelying Right Symptoms No nystagmus   Sidelying Left   Sidelying Left Symptoms No nystagmus   Horizontal Canal Right   Horizontal Canal Right Duration --  25 seconds   Horizontal Canal Right Symptoms Geotrophic  Horizontal Canal Left   Horizontal Canal Left Duration --  5 seconds   Horizontal Canal Left Symptoms Geotrophic     Although nystagmus geotropic to the right, there is a small rotary component, which could also indicate posterior canal involvement, however nystagmus not provoked in dix hallpike position.  PT to reassess after treating horizontal canal at next session.           Vestibular Treatment/Exercise - 04/30/15 0818    Vestibular Treatment/Exercise   Vestibular Treatment Provided Canalith Repositioning;Habituation   Canalith Repositioning Epley Manuever Right;Canal Roll Right   Habituation Exercises Horizontal Roll    EPLEY MANUEVER RIGHT   Number of Reps  --  1   Overall  Response No change   Response Details  --  did not provoke symptoms   Canal Roll Right   Number of Reps  2   Overall Response  Improved Symptoms   Response Details  --  nystagmus decrease, uncertain if due to fatiguable nature    Horizontal Roll   Number of Reps  --  5   Symptom Description  --  symptoms improve with repetition          PT Education - 2015-04-30 0837    Education provided Yes   Education Details HEP: rolling for habituation for horizontal canal BPPV   Person(s) Educated Patient   Methods Explanation;Demonstration;Handout   Comprehension Returned demonstration;Verbalized understanding             PT Long Term Goals - 04-30-15 1213    PT LONG TERM GOAL #1   Title The patient will be independent with HEP for habituation and high level balance, if indicated.   Baseline Target date 05/03/2015   Time 4   Period Weeks   PT LONG TERM GOAL #2   Title The patient will report no subjective dizziness with R horizontal rolling to demo decreased positional vertigo.   Baseline Target date 05/03/2015   Time 4   Period Weeks               Plan - 04/30/2015 1218    Clinical Impression Statement The patient is a 74 yo male with recent onset of BPPV.  At today's visit, his nystagmus are provokes with horizontal rolling indicating R horizontal canalithiasis (geotropic direction of nystagmus).  His symptoms improved significantly with canolith repositioning and habituation.  PT provided HEP and plans to f/u for one more session to ensure cleared.   Pt will benefit from skilled therapeutic intervention in order to improve on the following deficits Abnormal gait;Decreased balance;Other (comment)  vertigo   Rehab Potential Good   PT Frequency 1x / week   PT Duration 4 weeks   PT Treatment/Interventions Neuromuscular re-education;Balance training;Other (comment)  vestibular rehab, canolith repositioning techniques   PT Next Visit Plan Re-check R horizontal canal BPPV,  check HEP and progress as indicated.   Consulted and Agree with Plan of Care Patient          G-Codes - Apr 30, 2015 1220    Functional Assessment Tool Used R horizontal canal BPPv   Functional Limitation Self care   Self Care Current Status [phone]) At least 20 percent but less than 40 percent impaired, limited or restricted   Self Care Goal Status (E9381) At least 1 percent but less than 20 percent impaired, limited or restricted       Problem List Patient Active Problem List   Diagnosis Date Noted  . BPPV (benign paroxysmal positional vertigo) 02/23/2015  .  Preventative health care 06/14/2011  . Depression 06/14/2011  . PSA, INCREASED 03/16/2009  . HYPERLIPIDEMIA 03/10/2008  . BENIGN PROSTATIC HYPERTROPHY 03/10/2008  . DEGENERATIVE JOINT DISEASE 03/10/2008    Thank you for the referral of this patient.   [place], [place] 04/03/2015, 12:21 PM  [facility] [address] [place], [place], [address] Phone: [phone]   Fax:  [phone]

## 2015-04-03 NOTE — Patient Instructions (Signed)
Tip Card 1.The goal of habituation training is to assist in decreasing symptoms of vertigo, dizziness, or nausea provoked by specific head and body motions. 2.These exercises may initially increase symptoms; however, be persistent and work through symptoms. With repetition and time, the exercises will assist in reducing or eliminating symptoms. 3.Exercises should be stopped and discussed with the therapist if you experience any of the following: - Sudden change or fluctuation in hearing - New onset of ringing in the ears, or increase in current intensity - Any fluid discharge from the ear - Severe pain in neck or back - Extreme nausea  Copyright  VHI. All rights reserved.  Rolling   With pillow under head, start on back. Roll to your right side.  Hold until dizziness stops, plus 20 seconds and then roll to the left side.  Hold until dizziness stops, plus 20 seconds.  Repeat sequence 5 times per session. Do 2 sessions per day.  Copyright  VHI. All rights reserved.    

## 2015-04-15 ENCOUNTER — Ambulatory Visit: Payer: Medicare Other | Attending: Nurse Practitioner | Admitting: Rehabilitative and Restorative Service Providers"

## 2015-04-15 DIAGNOSIS — H8111 Benign paroxysmal vertigo, right ear: Secondary | ICD-10-CM

## 2015-04-15 DIAGNOSIS — R269 Unspecified abnormalities of gait and mobility: Secondary | ICD-10-CM | POA: Diagnosis not present

## 2015-04-15 NOTE — Therapy (Signed)
[facility] [address] [place], [place], [address] Phone: [phone]   Fax:  [phone]  Physical Therapy Treatment  Patient Details  Name: Alexander Castillo MRN: [id number] Date of Birth: [birth year] Referring Provider:  Biagio Borg, MD  Encounter Date: 04/15/2015      PT End of Session - 04/15/15 0829    Visit Number 2   Number of Visits 4   Date for PT Re-Evaluation 15-May-2015   Authorization Type G codes every 10th visit   PT Start Time 0804   PT Stop Time 0840   PT Time Calculation (min) 36 min   Activity Tolerance Patient tolerated treatment well   Behavior During Therapy [facility] for tasks assessed/performed      Past Medical History  Diagnosis Date  . BENIGN PROSTATIC HYPERTROPHY, HX OF 03/10/2008  . BENIGN PROSTATIC HYPERTROPHY 03/10/2008  . CHEST PAIN 03/10/2008  . DEGENERATIVE JOINT DISEASE 03/10/2008  . EAR PAIN, LEFT 12/09/2008  . HYPERLIPIDEMIA 03/10/2008  . PSA, INCREASED 03/16/2009  . RASH-NONVESICULAR 03/16/2009  . Depression 06/14/2011    Past Surgical History  Procedure Laterality Date  . Transurethral resection of prostate    . Hip replacement left  2002  . Back surgury  2002  . S/p left knee arthroscopy      There were no vitals filed for this visit.  Visit Diagnosis:  BPPV (benign paroxysmal positional vertigo), right  Abnormality of gait      Subjective Assessment - 04/15/15 0809    Subjective The patient reports symptoms are not severe, but he still experiences occasional sesnation of spinning that is brief in duration.  He notes bending over may still provoke symptoms.   Currently in Pain? No/denies                Vestibular Assessment - 04/15/15 0812    Positional Testing   Sidelying Test Sidelying Right;Sidelying Left   Horizontal Canal Testing Horizontal Canal Right;Horizontal Canal Left   Sidelying Right   Sidelying Right Symptoms Right nystagmus  mild rotary  component to nystagmus   Sidelying Left   Sidelying Left Symptoms Left nystagmus  geotropic appearing in nature   Horizontal Canal Right   Horizontal Canal Right Symptoms Geotrophic   Horizontal Canal Left   Horizontal Canal Left Symptoms Normal                 [facility] Adult PT Treatment/Exercise - 04/15/15 0001    Exercises   Exercises Neck   Neck Exercises: Stretches   Upper Trapezius Stretch 1 rep   Upper Trapezius Stretch Limitations Needs visual cues in mirror for correct technique   Chest Stretch 1 rep  supine over towel roll x 2 minutes with cues on chin tuck   Other Neck Stretches seated neck rotation x 5reps   Other Neck Stretches shoulder roll seated x 10 reps         Vestibular Treatment/Exercise - 04/15/15 0001    Vestibular Treatment/Exercise   Vestibular Treatment Provided Canalith Repositioning;Habituation   Canalith Repositioning Epley Manuever Right;Canal Roll Right   Habituation Exercises Brandt Daroff;Horizontal Roll    EPLEY MANUEVER RIGHT   Number of Reps  --  1   Overall Response Improved Symptoms   Response Details  --  note trace nystagmus when retested after maneuver   Canal Roll Right   Number of Reps  --  1   Overall Response  Improved Symptoms   Nestor Lewandowsky   Number of  Reps  --  2   Symptom Description  --  trace nystagmus with brief duration dizziness reported   Horizontal Roll   Number of Reps  --  2   Symptom Description  --  trace dizziness               PT Education - 04/15/15 0816    Education provided Yes   Education Details HEP: habituation for sit<>bilateral sidelying, neck ROM rotation, shoulder rolls, and towel roll stretch posture   Person(s) Educated Patient   Methods Explanation;Demonstration;Handout   Comprehension Returned demonstration;Verbalized understanding             PT Long Term Goals - 04/03/15 1213    PT LONG TERM GOAL #1   Title The patient will be independent with HEP for  habituation and high level balance, if indicated.   Baseline Target date 05/03/2015   Time 4   Period Weeks   PT LONG TERM GOAL #2   Title The patient will report no subjective dizziness with R horizontal rolling to demo decreased positional vertigo.   Baseline Target date 05/03/2015   Time 4   Period Weeks               Plan - 04/15/15 3299    Clinical Impression Statement Vertigo significantly improved, but not yet resolved.  PT to also provided neck ROM/posture stretching as decreased ROM hinders good positioning during canolith repositioning/Epley's maneuver.   PT Next Visit Plan check HEP, recheck BPPV, discharge if able.   Consulted and Agree with Plan of Care Patient        Problem List Patient Active Problem List   Diagnosis Date Noted  . BPPV (benign paroxysmal positional vertigo) 02/23/2015  . Preventative health care 06/14/2011  . Depression 06/14/2011  . PSA, INCREASED 03/16/2009  . HYPERLIPIDEMIA 03/10/2008  . BENIGN PROSTATIC HYPERTROPHY 03/10/2008  . DEGENERATIVE JOINT DISEASE 03/10/2008    Wynetta Seith, PT 04/15/2015, 8:41 AM  [facility] [address] [place], [place], [address] Phone: [phone]   Fax:  [phone]

## 2015-04-15 NOTE — Patient Instructions (Addendum)
Tip Card 1.The goal of habituation training is to assist in decreasing symptoms of vertigo, dizziness, or nausea provoked by specific head and body motions. 2.These exercises may initially increase symptoms; however, be persistent and work through symptoms. With repetition and time, the exercises will assist in reducing or eliminating symptoms. 3.Exercises should be stopped and discussed with the therapist if you experience any of the following: - Sudden change or fluctuation in hearing - New onset of ringing in the ears, or increase in current intensity - Any fluid discharge from the ear - Severe pain in neck or back - Extreme nausea  Copyright  VHI. All rights reserved.  Rolling   With pillow under head, start on back. Roll to your right side.  Hold until dizziness stops, plus 20 seconds and then roll to the left side.  Hold until dizziness stops, plus 20 seconds.  Repeat sequence 5 times per session. Do 2 sessions per day.  Copyright  VHI. All rights reserved.  Sit to Side-Lying   Sit on edge of bed. Lie down onto the right side and hold until dizziness stops, plus 20 seconds.  Return to sitting and wait until dizziness stops, plus 20 seconds.  Repeat to the left side. Repeat sequence 5 times per session. Do 2 sessions per day.  Copyright  VHI. All rights reserved. Healthy Back - Shoulder Roll   Stand straight with arms relaxed at sides. Roll shoulders backward continuously. Do __10__ times.   Copyright  VHI. All rights reserved.   AROM, Rotation   Sit or stand, head comfortable, centered position. Turn head slowly to look over one shoulder. Hold __5_ seconds. Repeat to other side. Repeat __5_ times per session. Do _2__ sessions per day.  Copyright  VHI. All rights reserved.  Thoracic Self-Mobilization (Supine)   With rolled towel placed lengthwise between your shoulder blades, lie back on towel with arms outstretched. Hold __2 minutes. Relax. Repeat ___1_ times per  set.  Do __2__ sessions per day.  http://orth.exer.us/1000   Copyright  VHI. All rights reserved.

## 2015-04-30 ENCOUNTER — Encounter: Payer: Medicare Other | Admitting: Rehabilitative and Restorative Service Providers"

## 2015-05-04 DIAGNOSIS — L570 Actinic keratosis: Secondary | ICD-10-CM | POA: Diagnosis not present

## 2015-05-04 DIAGNOSIS — L821 Other seborrheic keratosis: Secondary | ICD-10-CM | POA: Diagnosis not present

## 2015-05-04 DIAGNOSIS — B351 Tinea unguium: Secondary | ICD-10-CM | POA: Diagnosis not present

## 2015-05-04 DIAGNOSIS — Z85820 Personal history of malignant melanoma of skin: Secondary | ICD-10-CM | POA: Diagnosis not present

## 2015-05-04 DIAGNOSIS — L812 Freckles: Secondary | ICD-10-CM | POA: Diagnosis not present

## 2015-05-08 ENCOUNTER — Ambulatory Visit: Payer: Medicare Other | Admitting: Rehabilitative and Restorative Service Providers"

## 2015-05-21 ENCOUNTER — Encounter: Payer: Medicare Other | Admitting: Rehabilitative and Restorative Service Providers"

## 2015-05-21 NOTE — Therapy (Signed)
[facility] [address] [place], [place], [address] Phone: [phone]   Fax:  [phone]  Patient Details  Name: Alexander Castillo MRN: [id number] Date of Birth: 1941-10-11 Referring Provider:  No ref. provider found  Encounter Date: 05/21/2015  PHYSICAL THERAPY DISCHARGE SUMMARY  Visits from Start of Care: 2  Current functional level related to goals / functional outcomes:     PT Long Term Goals - 04/03/15 1213    PT LONG TERM GOAL #1   Title The patient will be independent with HEP for habituation and high level balance, if indicated.   Baseline Target date 05/03/2015   Time 4   Period Weeks   PT LONG TERM GOAL #2   Title The patient will report no subjective dizziness with R horizontal rolling to demo decreased positional vertigo.   Baseline Target date 05/03/2015   Time 4   Period Weeks     *Patient did not return to therapy for goals to be reassessed.  He discharged by phone reporting symptoms resolved.   Remaining deficits: None per patient phone call.   Education / Equipment: HEP, self management of dizziness.  Plan: Patient agrees to discharge.  Patient goals were partially met. Patient is being discharged due to meeting the stated rehab goals.  Goals not formally assessed due to patient not returning to clinic.  Thank you for the referral of this patient. ?????       Cairo Lingenfelter, PT 05/21/2015, 2:26 PM  [facility] [address] [place], [place], [address] Phone: [phone]   Fax:  [phone]

## 2015-06-11 ENCOUNTER — Encounter: Payer: Self-pay | Admitting: Gastroenterology

## 2015-07-01 ENCOUNTER — Other Ambulatory Visit (INDEPENDENT_AMBULATORY_CARE_PROVIDER_SITE_OTHER): Payer: Medicare Other

## 2015-07-01 DIAGNOSIS — N400 Enlarged prostate without lower urinary tract symptoms: Secondary | ICD-10-CM | POA: Diagnosis not present

## 2015-07-01 DIAGNOSIS — Z136 Encounter for screening for cardiovascular disorders: Secondary | ICD-10-CM

## 2015-07-01 LAB — LIPID PANEL
Cholesterol: 153 mg/dL (ref 0–200)
HDL: 49.2 mg/dL (ref >39.00)
LDL CALC: 90 mg/dL (ref 0–99)
NonHDL: 103.8
Total CHOL/HDL Ratio: 3
Triglycerides: 68 mg/dL (ref 0.0–149.0)
VLDL: 13.6 mg/dL (ref 0.0–40.0)

## 2015-07-01 LAB — URINALYSIS, ROUTINE W REFLEX MICROSCOPIC
BILIRUBIN URINE: NEGATIVE
Hgb urine dipstick: NEGATIVE
Ketones, ur: NEGATIVE
Leukocytes, UA: NEGATIVE
Nitrite: NEGATIVE
PH: 6 (ref 5.0–8.0)
RBC / HPF: NONE SEEN (ref 0–?)
SPECIFIC GRAVITY, URINE: 1.025 (ref 1.000–1.030)
TOTAL PROTEIN, URINE-UPE24: NEGATIVE
Urine Glucose: NEGATIVE
Urobilinogen, UA: 0.2 (ref 0.0–1.0)

## 2015-07-01 LAB — PSA: PSA: 2.48 ng/mL (ref 0.10–4.00)

## 2015-07-01 LAB — CBC WITH DIFFERENTIAL/PLATELET
Basophils Absolute: 0 10*3/uL (ref 0.0–0.1)
Basophils Relative: 0.4 % (ref 0.0–3.0)
Eosinophils Absolute: 0.1 10*3/uL (ref 0.0–0.7)
Eosinophils Relative: 1.2 % (ref 0.0–5.0)
HEMATOCRIT: 44.9 % (ref 39.0–52.0)
Hemoglobin: 14.9 g/dL (ref 13.0–17.0)
Lymphocytes Relative: 21.8 % (ref 12.0–46.0)
Lymphs Abs: 1.3 10*3/uL (ref 0.7–4.0)
MCHC: 33.2 g/dL (ref 30.0–36.0)
MCV: 91.8 fl (ref 78.0–100.0)
MONO ABS: 0.4 10*3/uL (ref 0.1–1.0)
Monocytes Relative: 7.6 % (ref 3.0–12.0)
NEUTROS ABS: 4 10*3/uL (ref 1.4–7.7)
Neutrophils Relative %: 69 % (ref 43.0–77.0)
PLATELETS: 225 10*3/uL (ref 150.0–400.0)
RBC: 4.89 Mil/uL (ref 4.22–5.81)
RDW: 13.7 % (ref 11.5–15.5)
WBC: 5.8 10*3/uL (ref 4.0–10.5)

## 2015-07-01 LAB — BASIC METABOLIC PANEL
BUN: 15 mg/dL (ref 6–23)
CALCIUM: 9.4 mg/dL (ref 8.4–10.5)
CO2: 28 meq/L (ref 19–32)
CREATININE: 0.91 mg/dL (ref 0.40–1.50)
Chloride: 106 mEq/L (ref 96–112)
GFR: 85.99 mL/min (ref >60.00)
GLUCOSE: 88 mg/dL (ref 70–99)
Potassium: 4.3 mEq/L (ref 3.5–5.1)
Sodium: 142 mEq/L (ref 135–145)

## 2015-07-01 LAB — TSH: TSH: 1.96 u[IU]/mL (ref 0.35–4.50)

## 2015-07-01 LAB — HEPATIC FUNCTION PANEL
ALBUMIN: 4.2 g/dL (ref 3.5–5.2)
ALK PHOS: 51 U/L (ref 39–117)
ALT: 18 U/L (ref 0–53)
AST: 31 U/L (ref 0–37)
Bilirubin, Direct: 0.1 mg/dL (ref 0.0–0.3)
TOTAL PROTEIN: 6.4 g/dL (ref 6.0–8.3)
Total Bilirubin: 0.6 mg/dL (ref 0.2–1.2)

## 2015-07-07 ENCOUNTER — Ambulatory Visit (INDEPENDENT_AMBULATORY_CARE_PROVIDER_SITE_OTHER): Payer: Medicare Other | Admitting: Internal Medicine

## 2015-07-07 ENCOUNTER — Encounter: Payer: Self-pay | Admitting: Internal Medicine

## 2015-07-07 VITALS — BP 136/70 | HR 62 | Temp 97.9°F | Wt 173.5 lb

## 2015-07-07 DIAGNOSIS — Z0000 Encounter for general adult medical examination without abnormal findings: Secondary | ICD-10-CM | POA: Diagnosis not present

## 2015-07-07 NOTE — Progress Notes (Signed)
Subjective:    Patient ID: Alexander Castillo, male    DOB: [birth year], 74 y.o.   MRN: [id number]  HPI  Here for wellness and f/u;  Overall doing ok;  Pt denies Chest pain, worsening SOB, DOE, wheezing, orthopnea, PND, worsening LE edema, palpitations, dizziness or syncope.  Pt denies neurological change such as new headache, facial or extremity weakness.  Pt denies polydipsia, polyuria, or low sugar symptoms. Pt states overall good compliance with treatment and medications, good tolerability, and has been trying to follow appropriate diet.  Pt denies worsening depressive symptoms, suicidal ideation or panic. No fever, night sweats, wt loss, loss of appetite, or other constitutional symptoms.  Pt states good ability with ADL's, has low fall risk, home safety reviewed and adequate, no other significant changes in hearing or vision, and active with exercise with fishing all day several days per wk. Only take asa daily. No current complaints Past Medical History  Diagnosis Date  . BENIGN PROSTATIC HYPERTROPHY, HX OF 03/10/2008  . BENIGN PROSTATIC HYPERTROPHY 03/10/2008  . CHEST PAIN 03/10/2008  . DEGENERATIVE JOINT DISEASE 03/10/2008  . EAR PAIN, LEFT 12/09/2008  . HYPERLIPIDEMIA 03/10/2008  . PSA, INCREASED 03/16/2009  . RASH-NONVESICULAR 03/16/2009  . Depression 06/14/2011   Past Surgical History  Procedure Laterality Date  . Transurethral resection of prostate    . Hip replacement left  2002  . Back surgury  2002  . S/p left knee arthroscopy      reports that he has never smoked. He does not have any smokeless tobacco history on file. He reports that he does not drink alcohol. His drug history is not on file. family history includes Dementia in his mother. No Known Allergies Current Outpatient Prescriptions on File Prior to Visit  Medication Sig Dispense Refill  . aspirin 81 MG tablet Take 81 mg by mouth daily.       No current facility-administered medications on file prior to visit.   Review  of Systems Constitutional: Negative for increased diaphoresis, other activity, appetite or siginficant weight change other than noted HENT: Negative for worsening hearing loss, ear pain, facial swelling, mouth sores and neck stiffness.   Eyes: Negative for other worsening pain, redness or visual disturbance.  Respiratory: Negative for shortness of breath and wheezing  Cardiovascular: Negative for chest pain and palpitations.  Gastrointestinal: Negative for diarrhea, blood in stool, abdominal distention or other pain Genitourinary: Negative for hematuria, flank pain or change in urine volume.  Musculoskeletal: Negative for myalgias or other joint complaints.  Skin: Negative for color change and wound or drainage.  Neurological: Negative for syncope and numbness. other than noted Hematological: Negative for adenopathy. or other swelling Psychiatric/Behavioral: Negative for hallucinations, SI, self-injury, decreased concentration or other worsening agitation.      Objective:   Physical Exam BP 136/70 mmHg  Pulse 62  Temp(Src) 97.9 F (36.6 C)  Wt 173 lb 8 oz (78.699 kg)  SpO2 98% VS noted,  Constitutional: Pt is oriented to person, place, and time. Appears well-developed and well-nourished, in no significant distress Head: Normocephalic and atraumatic.  Right Ear: External ear normal.  Left Ear: External ear normal.  Nose: Nose normal.  Mouth/Throat: Oropharynx is clear and moist.  Eyes: Conjunctivae and EOM are normal. Pupils are equal, round, and reactive to light.  Neck: Normal range of motion. Neck supple. No JVD present. No tracheal deviation present or significant neck LA or mass Cardiovascular: Normal rate, regular rhythm, normal heart sounds and intact distal pulses.  Pulmonary/Chest: Effort normal and breath sounds without rales or wheezing  Abdominal: Soft. Bowel sounds are normal. NT. No HSM  Musculoskeletal: Normal range of motion. Exhibits no edema.  Lymphadenopathy:   Has no cervical adenopathy.  Neurological: Pt is alert and oriented to person, place, and time. Pt has normal reflexes. No cranial nerve deficit. Motor grossly intact Skin: Skin is warm and dry. No rash noted.  Psychiatric:  Has normal mood and affect. Behavior is normal.     Assessment & Plan:

## 2015-07-07 NOTE — Assessment & Plan Note (Signed)

## 2015-07-07 NOTE — Patient Instructions (Signed)
Please continue all other medications as before, and refills have been done if requested.  Please have the pharmacy call with any other refills you may need.  Please continue your efforts at being more active, low cholesterol diet, and weight control.  You are otherwise up to date with prevention measures today.  Please keep your appointments with your specialists as you may have planned  Please return in 1 year for your yearly visit, or sooner if needed, with Lab testing done 3-5 days before  

## 2015-07-07 NOTE — Progress Notes (Signed)
Pre visit review using our clinic review tool, if applicable. No additional management support is needed unless otherwise documented below in the visit note. 

## 2015-07-22 DIAGNOSIS — N138 Other obstructive and reflux uropathy: Secondary | ICD-10-CM | POA: Diagnosis not present

## 2015-07-22 DIAGNOSIS — N401 Enlarged prostate with lower urinary tract symptoms: Secondary | ICD-10-CM | POA: Diagnosis not present

## 2015-09-02 DIAGNOSIS — H524 Presbyopia: Secondary | ICD-10-CM | POA: Diagnosis not present

## 2015-09-08 DIAGNOSIS — Z23 Encounter for immunization: Secondary | ICD-10-CM | POA: Diagnosis not present

## 2015-10-01 DIAGNOSIS — M79641 Pain in right hand: Secondary | ICD-10-CM | POA: Diagnosis not present

## 2015-10-12 DIAGNOSIS — M79641 Pain in right hand: Secondary | ICD-10-CM | POA: Diagnosis not present

## 2015-10-14 DIAGNOSIS — R202 Paresthesia of skin: Secondary | ICD-10-CM | POA: Diagnosis not present

## 2015-10-16 ENCOUNTER — Other Ambulatory Visit: Payer: Self-pay | Admitting: Orthopedic Surgery

## 2015-10-16 DIAGNOSIS — M25531 Pain in right wrist: Secondary | ICD-10-CM

## 2015-10-28 ENCOUNTER — Ambulatory Visit
Admission: RE | Admit: 2015-10-28 | Discharge: 2015-10-28 | Disposition: A | Discharge status: Home / Self Care | Payer: Medicare Other | Source: Ambulatory Visit | Attending: Orthopedic Surgery | Admitting: Orthopedic Surgery

## 2015-10-28 DIAGNOSIS — M19031 Primary osteoarthritis, right wrist: Secondary | ICD-10-CM | POA: Diagnosis not present

## 2015-10-28 DIAGNOSIS — M25531 Pain in right wrist: Secondary | ICD-10-CM

## 2015-11-02 DIAGNOSIS — G5601 Carpal tunnel syndrome, right upper limb: Secondary | ICD-10-CM | POA: Diagnosis not present

## 2015-11-02 DIAGNOSIS — R202 Paresthesia of skin: Secondary | ICD-10-CM | POA: Diagnosis not present

## 2015-11-02 DIAGNOSIS — M79641 Pain in right hand: Secondary | ICD-10-CM | POA: Diagnosis not present

## 2015-11-09 DIAGNOSIS — Z85820 Personal history of malignant melanoma of skin: Secondary | ICD-10-CM | POA: Diagnosis not present

## 2015-11-09 DIAGNOSIS — L812 Freckles: Secondary | ICD-10-CM | POA: Diagnosis not present

## 2015-11-09 DIAGNOSIS — L821 Other seborrheic keratosis: Secondary | ICD-10-CM | POA: Diagnosis not present

## 2015-11-09 DIAGNOSIS — L570 Actinic keratosis: Secondary | ICD-10-CM | POA: Diagnosis not present

## 2015-11-09 DIAGNOSIS — L820 Inflamed seborrheic keratosis: Secondary | ICD-10-CM | POA: Diagnosis not present

## 2015-11-09 DIAGNOSIS — D1801 Hemangioma of skin and subcutaneous tissue: Secondary | ICD-10-CM | POA: Diagnosis not present

## 2015-12-21 DIAGNOSIS — G5601 Carpal tunnel syndrome, right upper limb: Secondary | ICD-10-CM | POA: Diagnosis not present

## 2016-03-12 DIAGNOSIS — N390 Urinary tract infection, site not specified: Secondary | ICD-10-CM | POA: Diagnosis not present

## 2016-03-12 DIAGNOSIS — R300 Dysuria: Secondary | ICD-10-CM | POA: Diagnosis not present

## 2016-05-10 DIAGNOSIS — Z85820 Personal history of malignant melanoma of skin: Secondary | ICD-10-CM | POA: Diagnosis not present

## 2016-05-10 DIAGNOSIS — L821 Other seborrheic keratosis: Secondary | ICD-10-CM | POA: Diagnosis not present

## 2016-05-18 DIAGNOSIS — M1611 Unilateral primary osteoarthritis, right hip: Secondary | ICD-10-CM | POA: Diagnosis not present

## 2016-05-18 DIAGNOSIS — M25551 Pain in right hip: Secondary | ICD-10-CM | POA: Diagnosis not present

## 2016-06-01 ENCOUNTER — Other Ambulatory Visit (HOSPITAL_COMMUNITY): Payer: Self-pay | Admitting: *Deleted

## 2016-06-01 NOTE — Patient Instructions (Signed)
Alexander Castillo  06/01/2016   Your procedure is scheduled on: 06-13-16  Report to [facility]  Entrance take [facility]  elevators to 3rd floor to  [place] at  1130 AM.  Call this number if you have problems the morning of surgery [phone]   Remember: ONLY 1 PERSON MAY GO WITH YOU TO SHORT STAY TO GET  READY MORNING OF [place].  Do not eat food :After Midnight, MAY HAVE CLEAR LIQUIDS FROM MIDNIGHT UNTIL 730 AM DAY OF SURGERY, NOTHING BY MOUTH AFTER 730 AM DAY OF SURGERY.     Take these medicines the morning of surgery with A SIP OF WATER: NONE              You may not have any metal on your body including hair pins and              piercings  Do not wear jewelry, make-up, lotions, powders or perfumes, deodorant             Do not wear nail polish.  Do not shave  48 hours prior to surgery.              Men may shave face and neck.   Do not bring valuables to the hospital. [place].  Contacts, dentures or bridgework may not be worn into surgery.  Leave suitcase in the car. After surgery it may be brought to your room.                 Please read over the following fact sheets you were given: _____________________________________________________________________                CLEAR LIQUID DIET   Foods Allowed                                                                     Foods Excluded  Coffee and tea, regular and decaf                             liquids that you cannot  Plain Jell-O in any flavor                                             see through such as: Fruit ices (not with fruit pulp)                                     milk, soups, orange juice  Iced Popsicles                                    All solid food Carbonated beverages, regular and diet  Cranberry, grape and apple juices Sports drinks like Gatorade Lightly seasoned clear  broth or consume(fat free) Sugar, honey syrup  Sample Menu Breakfast                                Lunch                                     Supper Cranberry juice                    Beef broth                            Chicken broth Jell-O                                     Grape juice                           Apple juice Coffee or tea                        Jell-O                                      Popsicle                                                Coffee or tea                        Coffee or tea  _____________________________________________________________________  [facility] - Preparing for Surgery Before surgery, you can play an important role.  Because skin is not sterile, your skin needs to be as free of germs as possible.  You can reduce the number of germs on your skin by washing with CHG (chlorahexidine gluconate) soap before surgery.  CHG is an antiseptic cleaner which kills germs and bonds with the skin to continue killing germs even after washing. Please DO NOT use if you have an allergy to CHG or antibacterial soaps.  If your skin becomes reddened/irritated stop using the CHG and inform your nurse when you arrive at Short Stay. Do not shave (including legs and underarms) for at least 48 hours prior to the first CHG shower.  You may shave your face/neck. Please follow these instructions carefully:  1.  Shower with CHG Soap the night before surgery and the  morning of Surgery.  2.  If you choose to wash your hair, wash your hair first as usual with your  normal  shampoo.  3.  After you shampoo, rinse your hair and body thoroughly to remove the  shampoo.                           4.  Use CHG as you would any other liquid soap.  You can apply chg directly  to the skin and wash  Gently with a scrungie or clean washcloth.  5.  Apply the CHG Soap to your body ONLY FROM THE NECK DOWN.   Do not use on face/ open                           Wound or open  sores. Avoid contact with eyes, ears mouth and genitals (private parts).                       Wash face,  Genitals (private parts) with your normal soap.             6.  Wash thoroughly, paying special attention to the area where your surgery  will be performed.  7.  Thoroughly rinse your body with warm water from the neck down.  8.  DO NOT shower/wash with your normal soap after using and rinsing off  the CHG Soap.                9.  Pat yourself dry with a clean towel.            10.  Wear clean pajamas.            11.  Place clean sheets on your bed the night of your first shower and do not  sleep with pets. Day of Surgery : Do not apply any lotions/deodorants the morning of surgery.  Please wear clean clothes to the hospital/surgery center.  FAILURE TO FOLLOW THESE INSTRUCTIONS MAY RESULT IN THE CANCELLATION OF YOUR SURGERY PATIENT SIGNATURE_________________________________  NURSE SIGNATURE__________________________________  ________________________________________________________________________   Alexander Castillo  An incentive spirometer is a tool that can help keep your lungs clear and active. This tool measures how well you are filling your lungs with each breath. Taking long deep breaths may help reverse or decrease the chance of developing breathing (pulmonary) problems (especially infection) following:  A long period of time when you are unable to move or be active. BEFORE THE PROCEDURE   If the spirometer includes an indicator to show your best effort, your nurse or respiratory therapist will set it to a desired goal.  If possible, sit up straight or lean slightly forward. Try not to slouch.  Hold the incentive spirometer in an upright position. INSTRUCTIONS FOR USE   Sit on the edge of your bed if possible, or sit up as far as you can in bed or on a chair.  Hold the incentive spirometer in an upright position.  Breathe out normally.  Place the mouthpiece in  your mouth and seal your lips tightly around it.  Breathe in slowly and as deeply as possible, raising the piston or the ball toward the top of the column.  Hold your breath for 3-5 seconds or for as long as possible. Allow the piston or ball to fall to the bottom of the column.  Remove the mouthpiece from your mouth and breathe out normally.  Rest for a few seconds and repeat Steps 1 through 7 at least 10 times every 1-2 hours when you are awake. Take your time and take a few normal breaths between deep breaths.  The spirometer may include an indicator to show your best effort. Use the indicator as a goal to work toward during each repetition.  After each set of 10 deep breaths, practice coughing to be sure your lungs are clear. If you have an incision (the cut made at the time of  surgery), support your incision when coughing by placing a pillow or rolled up towels firmly against it. Once you are able to get out of bed, walk around indoors and cough well. You may stop using the incentive spirometer when instructed by your caregiver.  RISKS AND COMPLICATIONS  Take your time so you do not get dizzy or light-headed.  If you are in pain, you may need to take or ask for pain medication before doing incentive spirometry. It is harder to take a deep breath if you are having pain. AFTER USE  Rest and breathe slowly and easily.  It can be helpful to keep track of a log of your progress. Your caregiver can provide you with a simple table to help with this. If you are using the spirometer at home, follow these instructions: [place] IF:   You are having difficultly using the spirometer.  You have trouble using the spirometer as often as instructed.  Your pain medication is not giving enough relief while using the spirometer.  You develop fever of 100.5 F (38.1 C) or higher. SEEK IMMEDIATE MEDICAL CARE IF:   You cough up bloody sputum that had not been present before.  You  develop fever of 102 F (38.9 C) or greater.  You develop worsening pain at or near the incision site. MAKE SURE YOU:   Understand these instructions.  Will watch your condition.  Will get help right away if you are not doing well or get worse. Document Released: 04/10/2007 Document Revised: 02/20/2012 Document Reviewed: 06/11/2007 ExitCare Patient Information 2014 [employer], [place].   ________________________________________________________________________  WHAT IS A BLOOD TRANSFUSION? Blood Transfusion Information  A transfusion is the replacement of blood or some of its parts. Blood is made up of multiple cells which provide different functions.  Red blood cells carry oxygen and are used for blood loss replacement.  White blood cells fight against infection.  Platelets control bleeding.  Plasma helps clot blood.  Other blood products are available for specialized needs, such as hemophilia or other clotting disorders. BEFORE THE TRANSFUSION  Who gives blood for transfusions?   Healthy volunteers who are fully evaluated to make sure their blood is safe. This is blood bank blood. Transfusion therapy is the safest it has ever been in the practice of medicine. Before blood is taken from a donor, a complete history is taken to make sure that person has no history of diseases nor engages in risky social behavior (examples are intravenous drug use or sexual activity with multiple partners). The donor's travel history is screened to minimize risk of transmitting infections, such as malaria. The donated blood is tested for signs of infectious diseases, such as HIV and hepatitis. The blood is then tested to be sure it is compatible with you in order to minimize the chance of a transfusion reaction. If you or a relative donates blood, this is often done in anticipation of surgery and is not appropriate for emergency situations. It takes many days to process the donated blood. RISKS AND  COMPLICATIONS Although transfusion therapy is very safe and saves many lives, the main dangers of transfusion include:   Getting an infectious disease.  Developing a transfusion reaction. This is an allergic reaction to something in the blood you were given. Every precaution is taken to prevent this. The decision to have a blood transfusion has been considered carefully by your caregiver before blood is given. Blood is not given unless the benefits outweigh the risks. AFTER THE  TRANSFUSION  Right after receiving a blood transfusion, you will usually feel much better and more energetic. This is especially true if your red blood cells have gotten low (anemic). The transfusion raises the level of the red blood cells which carry oxygen, and this usually causes an energy increase.  The nurse administering the transfusion will monitor you carefully for complications. HOME CARE INSTRUCTIONS  No special instructions are needed after a transfusion. You may find your energy is better. Speak with your caregiver about any limitations on activity for underlying diseases you may have. SEEK MEDICAL CARE IF:   Your condition is not improving after your transfusion.  You develop redness or irritation at the intravenous (IV) site. SEEK IMMEDIATE MEDICAL CARE IF:  Any of the following symptoms occur over the next 12 hours:  Shaking chills.  You have a temperature by mouth above 102 F (38.9 C), not controlled by medicine.  Chest, back, or muscle pain.  People around you feel you are not acting correctly or are confused.  Shortness of breath or difficulty breathing.  Dizziness and fainting.  You get a rash or develop hives.  You have a decrease in urine output.  Your urine turns a dark color or changes to pink, red, or brown. Any of the following symptoms occur over the next 10 days:  You have a temperature by mouth above 102 F (38.9 C), not controlled by medicine.  Shortness of  breath.  Weakness after normal activity.  The white part of the eye turns yellow (jaundice).  You have a decrease in the amount of urine or are urinating less often.  Your urine turns a dark color or changes to pink, red, or brown. Document Released: 11/25/2000 Document Revised: 02/20/2012 Document Reviewed: 07/14/2008 [facility] Patient Information 2014 [place], [place].  _______________________________________________________________________

## 2016-06-01 NOTE — Progress Notes (Signed)
MEDICAL CLEARANCE NOTE DR Cathlean Cower ON CHART FOR 06-13-16 SURGERY

## 2016-06-02 ENCOUNTER — Encounter (HOSPITAL_COMMUNITY): Payer: Self-pay

## 2016-06-02 ENCOUNTER — Encounter (HOSPITAL_COMMUNITY)
Admission: RE | Admit: 2016-06-02 | Discharge: 2016-06-02 | Disposition: A | Discharge status: Home / Self Care | Payer: Medicare Other | Source: Ambulatory Visit | Attending: Orthopedic Surgery | Admitting: Orthopedic Surgery

## 2016-06-02 DIAGNOSIS — Z01812 Encounter for preprocedural laboratory examination: Secondary | ICD-10-CM | POA: Insufficient documentation

## 2016-06-02 DIAGNOSIS — M1611 Unilateral primary osteoarthritis, right hip: Secondary | ICD-10-CM | POA: Diagnosis not present

## 2016-06-02 DIAGNOSIS — Z0183 Encounter for blood typing: Secondary | ICD-10-CM | POA: Diagnosis not present

## 2016-06-02 LAB — CBC
HCT: 44 % (ref 39.0–52.0)
Hemoglobin: 14.4 g/dL (ref 13.0–17.0)
MCH: 30.1 pg (ref 26.0–34.0)
MCHC: 32.7 g/dL (ref 30.0–36.0)
MCV: 92.1 fL (ref 78.0–100.0)
PLATELETS: 246 10*3/uL (ref 150–400)
RBC: 4.78 MIL/uL (ref 4.22–5.81)
RDW: 14 % (ref 11.5–15.5)
WBC: 6.8 10*3/uL (ref 4.0–10.5)

## 2016-06-02 LAB — SURGICAL PCR SCREEN
MRSA, PCR: NEGATIVE
STAPHYLOCOCCUS AUREUS: NEGATIVE

## 2016-06-02 LAB — TYPE AND SCREEN
ABO/RH(D): AB POS
ANTIBODY SCREEN: NEGATIVE

## 2016-06-02 NOTE — H&P (Signed)
TOTAL HIP ADMISSION H&P  Patient is admitted for right total hip arthroplasty, anterior approach.  Subjective:  Chief Complaint:    Right hip primary OA / pain  HPI: Alexander Castillo, 75 y.o. male, has a history of pain and functional disability in the right hip(s) due to arthritis and patient has failed non-surgical conservative treatments for greater than 12 weeks to include NSAID's and/or analgesics and activity modification.  Onset of symptoms was gradual starting 1+ years ago with gradually worsening course since that time.The patient noted prior procedures of the hip to include arthroplasty on the left hip in 2002.  Patient currently rates pain in the right hip at 10 out of 10 with activity. Patient has worsening of pain with activity and weight bearing, trendelenberg gait, pain that interfers with activities of daily living and pain with passive range of motion. Patient has evidence of periarticular osteophytes and joint space narrowing by imaging studies. This condition presents safety issues increasing the risk of falls.   There is no current active infection.   Risks, benefits and expectations were discussed with the patient.  Risks including but not limited to the risk of anesthesia, blood clots, nerve damage, blood vessel damage, failure of the prosthesis, infection and up to and including death.  Patient understand the risks, benefits and expectations and wishes to proceed with surgery.   PCP: Cathlean Cower, MD  D/C Plans:      Home with HHPT  Post-op Meds:       No Rx given  Tranexamic Acid:      To be given - IV   Decadron:      Is to be given  FYI:     ASA  Norco    Patient Active Problem List   Diagnosis Date Noted  . BPPV (benign paroxysmal positional vertigo) 02/23/2015  . Preventative health care 06/14/2011  . Depression 06/14/2011  . PSA, INCREASED 03/16/2009  . HYPERLIPIDEMIA 03/10/2008  . BENIGN PROSTATIC HYPERTROPHY 03/10/2008  . DEGENERATIVE JOINT DISEASE  03/10/2008   Past Medical History  Diagnosis Date  . BENIGN PROSTATIC HYPERTROPHY, HX OF 03/10/2008  . BENIGN PROSTATIC HYPERTROPHY 03/10/2008  . CHEST PAIN 03/10/2008  . DEGENERATIVE JOINT DISEASE 03/10/2008  . EAR PAIN, LEFT 12/09/2008  . HYPERLIPIDEMIA 03/10/2008  . PSA, INCREASED 03/16/2009  . RASH-NONVESICULAR 03/16/2009    Past Surgical History  Procedure Laterality Date  . Transurethral resection of prostate    . Hip replacement left Left 2002  . Back surgury  1998    lower  . S/p left knee arthroscopy  yrs ago  . Eye surgery Bilateral     lens replacement for cataracts    No prescriptions prior to admission   No Known Allergies   Social History  Substance Use Topics  . Smoking status: Never Smoker   . Smokeless tobacco: Never Used  . Alcohol Use: No    Family History  Problem Relation Age of Onset  . Dementia Mother      Review of Systems  Constitutional: Negative.   HENT: Negative.   Eyes: Negative.   Respiratory: Negative.   Cardiovascular: Negative.   Gastrointestinal: Negative.   Genitourinary: Negative.   Musculoskeletal: Positive for joint pain.  Skin: Negative.   Neurological: Negative.   Endo/Heme/Allergies: Negative.   Psychiatric/Behavioral: Positive for depression.    Objective:  Physical Exam  Constitutional: He is oriented to person, place, and time. He appears well-developed.  HENT:  Head: Normocephalic.  Mouth/Throat: He has dentures.  Eyes: Pupils are equal, round, and reactive to light.  Neck: Neck supple. No JVD present. No tracheal deviation present. No thyromegaly present.  Cardiovascular: Normal rate, regular rhythm, normal heart sounds and intact distal pulses.   Respiratory: Effort normal and breath sounds normal. No stridor. No respiratory distress. He has no wheezes.  GI: Soft. There is no tenderness. There is no guarding.  Musculoskeletal:       Right hip: He exhibits decreased range of motion, decreased strength, tenderness  and bony tenderness. He exhibits no swelling, no deformity and no laceration.  Lymphadenopathy:    He has no cervical adenopathy.  Neurological: He is alert and oriented to person, place, and time.  Skin: Skin is warm and dry.  Psychiatric: He has a normal mood and affect.    Vital signs in last 24 hours: Temp:  [98.1 F (36.7 C)] 98.1 F (36.7 C) (06/22 1051) Pulse Rate:  [87] 87 (06/22 1051) Resp:  [18] 18 (06/22 1051) BP: (137)/(65) 137/65 mmHg (06/22 1051) SpO2:  [99 %] 99 % (06/22 1051) Weight:  [76.114 kg (167 lb 12.8 oz)] 76.114 kg (167 lb 12.8 oz) (06/22 1051)  Labs:   Estimated body mass index is 26.39 kg/(m^2) as calculated from the following:   Height as of 02/23/15: 5\' 8"  (1.727 m).   Weight as of 07/07/15: 78.699 kg (173 lb 8 oz).   Imaging Review Plain radiographs demonstrate severe degenerative joint disease of the right hip(s). The bone quality appears to be good for age and reported activity level.  Assessment/Plan:  End stage arthritis, right hip(s)  The patient history, physical examination, clinical judgement of the provider and imaging studies are consistent with end stage degenerative joint disease of the right hip(s) and total hip arthroplasty is deemed medically necessary. The treatment options including medical management, injection therapy, arthroscopy and arthroplasty were discussed at length. The risks and benefits of total hip arthroplasty were presented and reviewed. The risks due to aseptic loosening, infection, stiffness, dislocation/subluxation,  thromboembolic complications and other imponderables were discussed.  The patient acknowledged the explanation, agreed to proceed with the plan and consent was signed. Patient is being admitted for inpatient treatment for surgery, pain control, PT, OT, prophylactic antibiotics, VTE prophylaxis, progressive ambulation and ADL's and discharge planning.The patient is planning to be discharged home with home health  services.      [place] Pugh Linden Tagliaferro   PA-C  06/02/2016, 2:25 PM

## 2016-06-06 NOTE — Progress Notes (Signed)
Pt aware to arrive at [facility] on 06/13/2016 at 5:15 am. Pt verbalized understanding of  no food or drink after midnight.

## 2016-06-13 ENCOUNTER — Encounter (HOSPITAL_COMMUNITY)
Admission: RE | Disposition: A | Discharge status: Home Health | Payer: Self-pay | Source: Ambulatory Visit | Attending: Orthopedic Surgery

## 2016-06-13 ENCOUNTER — Encounter (HOSPITAL_COMMUNITY): Payer: Self-pay | Admitting: *Deleted

## 2016-06-13 ENCOUNTER — Inpatient Hospital Stay (HOSPITAL_COMMUNITY): Payer: Medicare Other | Admitting: Certified Registered"

## 2016-06-13 ENCOUNTER — Inpatient Hospital Stay (HOSPITAL_COMMUNITY): Payer: Medicare Other

## 2016-06-13 ENCOUNTER — Inpatient Hospital Stay (HOSPITAL_COMMUNITY)
Admission: RE | Admit: 2016-06-13 | Discharge: 2016-06-14 | DRG: 470 | Disposition: A | Discharge status: Home Health | Payer: Medicare Other | Source: Ambulatory Visit | Attending: Orthopedic Surgery | Admitting: Orthopedic Surgery

## 2016-06-13 DIAGNOSIS — Z96641 Presence of right artificial hip joint: Secondary | ICD-10-CM

## 2016-06-13 DIAGNOSIS — Z9889 Other specified postprocedural states: Secondary | ICD-10-CM

## 2016-06-13 DIAGNOSIS — E785 Hyperlipidemia, unspecified: Secondary | ICD-10-CM | POA: Diagnosis present

## 2016-06-13 DIAGNOSIS — M1611 Unilateral primary osteoarthritis, right hip: Secondary | ICD-10-CM | POA: Diagnosis not present

## 2016-06-13 DIAGNOSIS — Z96642 Presence of left artificial hip joint: Secondary | ICD-10-CM | POA: Diagnosis present

## 2016-06-13 DIAGNOSIS — R269 Unspecified abnormalities of gait and mobility: Secondary | ICD-10-CM | POA: Diagnosis not present

## 2016-06-13 DIAGNOSIS — M25551 Pain in right hip: Secondary | ICD-10-CM | POA: Diagnosis present

## 2016-06-13 DIAGNOSIS — F329 Major depressive disorder, single episode, unspecified: Secondary | ICD-10-CM | POA: Diagnosis present

## 2016-06-13 DIAGNOSIS — Z471 Aftercare following joint replacement surgery: Secondary | ICD-10-CM | POA: Diagnosis not present

## 2016-06-13 HISTORY — PX: TOTAL HIP ARTHROPLASTY: SHX124

## 2016-06-13 SURGERY — ARTHROPLASTY, HIP, TOTAL, ANTERIOR APPROACH
Anesthesia: Spinal | Site: Hip | Laterality: Right

## 2016-06-13 MED ORDER — MIDAZOLAM HCL 2 MG/2ML IJ SOLN
INTRAMUSCULAR | Status: AC
  Filled 2016-06-13: qty 2

## 2016-06-13 MED ORDER — HYDROMORPHONE HCL 1 MG/ML IJ SOLN
INTRAMUSCULAR | Status: AC
  Filled 2016-06-13: qty 1

## 2016-06-13 MED ORDER — FENTANYL CITRATE (PF) 100 MCG/2ML IJ SOLN
INTRAMUSCULAR | Status: DC | PRN
  Administered 2016-06-13: 100 ug via INTRAVENOUS

## 2016-06-13 MED ORDER — CEFAZOLIN SODIUM-DEXTROSE 2-4 GM/100ML-% IV SOLN
INTRAVENOUS | Status: AC
  Filled 2016-06-13: qty 100

## 2016-06-13 MED ORDER — BUPIVACAINE HCL (PF) 0.5 % IJ SOLN
INTRAMUSCULAR | Status: AC
  Filled 2016-06-13: qty 30

## 2016-06-13 MED ORDER — METOCLOPRAMIDE HCL 5 MG/ML IJ SOLN: 5.0000 mg | Freq: Three times a day (TID) | INTRAMUSCULAR | Status: DC | PRN

## 2016-06-13 MED ORDER — HYDROMORPHONE HCL 1 MG/ML IJ SOLN: 0.5000 mg | INTRAMUSCULAR | Status: DC | PRN

## 2016-06-13 MED ORDER — LACTATED RINGERS IV SOLN
INTRAVENOUS | Status: DC
  Administered 2016-06-13: 10:00:00 via INTRAVENOUS

## 2016-06-13 MED ORDER — POLYETHYLENE GLYCOL 3350 17 G PO PACK: 17.0000 g | PACK | Freq: Every day | ORAL | Status: DC | PRN

## 2016-06-13 MED ORDER — TRANEXAMIC ACID 1000 MG/10ML IV SOLN
1000.0000 mg | Freq: Once | INTRAVENOUS | Status: AC
  Administered 2016-06-13: 1000 mg via INTRAVENOUS
  Filled 2016-06-13: qty 10

## 2016-06-13 MED ORDER — HYDROMORPHONE HCL 1 MG/ML IJ SOLN
INTRAMUSCULAR | Status: DC | PRN
  Administered 2016-06-13 (×2): 1 mg via INTRAVENOUS

## 2016-06-13 MED ORDER — METOCLOPRAMIDE HCL 5 MG PO TABS: 5.0000 mg | ORAL_TABLET | Freq: Three times a day (TID) | ORAL | Status: DC | PRN

## 2016-06-13 MED ORDER — DEXAMETHASONE SODIUM PHOSPHATE 10 MG/ML IJ SOLN
10.0000 mg | Freq: Once | INTRAMUSCULAR | Status: AC
  Administered 2016-06-14: 10 mg via INTRAVENOUS
  Filled 2016-06-13: qty 1

## 2016-06-13 MED ORDER — SUGAMMADEX SODIUM 200 MG/2ML IV SOLN
INTRAVENOUS | Status: DC | PRN
  Administered 2016-06-13: 175 mg via INTRAVENOUS

## 2016-06-13 MED ORDER — ALUM & MAG HYDROXIDE-SIMETH 200-200-20 MG/5ML PO SUSP: 30.0000 mL | ORAL | Status: DC | PRN

## 2016-06-13 MED ORDER — DEXAMETHASONE SODIUM PHOSPHATE 10 MG/ML IJ SOLN
10.0000 mg | Freq: Once | INTRAMUSCULAR | Status: AC
  Administered 2016-06-13: 10 mg via INTRAVENOUS

## 2016-06-13 MED ORDER — CEFAZOLIN SODIUM-DEXTROSE 2-4 GM/100ML-% IV SOLN
2.0000 g | INTRAVENOUS | Status: AC
  Administered 2016-06-13: 2 g via INTRAVENOUS
  Filled 2016-06-13: qty 100

## 2016-06-13 MED ORDER — MENTHOL 3 MG MT LOZG: 1.0000 | LOZENGE | OROMUCOSAL | Status: DC | PRN

## 2016-06-13 MED ORDER — PROPOFOL 10 MG/ML IV BOLUS
INTRAVENOUS | Status: DC | PRN
  Administered 2016-06-13: 160 mg via INTRAVENOUS

## 2016-06-13 MED ORDER — HYDROCODONE-ACETAMINOPHEN 7.5-325 MG PO TABS
1.0000 | ORAL_TABLET | ORAL | Status: DC | PRN
  Administered 2016-06-13 – 2016-06-14 (×3): 1 via ORAL
  Filled 2016-06-13 (×3): qty 1

## 2016-06-13 MED ORDER — DOCUSATE SODIUM 100 MG PO CAPS
100.0000 mg | ORAL_CAPSULE | Freq: Two times a day (BID) | ORAL | Status: DC
  Administered 2016-06-13 – 2016-06-14 (×2): 100 mg via ORAL
  Filled 2016-06-13 (×2): qty 1

## 2016-06-13 MED ORDER — ACETAMINOPHEN 650 MG RE SUPP: 650.0000 mg | Freq: Four times a day (QID) | RECTAL | Status: DC | PRN

## 2016-06-13 MED ORDER — FERROUS SULFATE 325 (65 FE) MG PO TABS
325.0000 mg | ORAL_TABLET | Freq: Three times a day (TID) | ORAL | Status: DC
  Administered 2016-06-13 – 2016-06-14 (×2): 325 mg via ORAL
  Filled 2016-06-13 (×2): qty 1

## 2016-06-13 MED ORDER — LACTATED RINGERS IV SOLN
INTRAVENOUS | Status: DC | PRN
  Administered 2016-06-13 (×2): via INTRAVENOUS

## 2016-06-13 MED ORDER — METHOCARBAMOL 1000 MG/10ML IJ SOLN
500.0000 mg | Freq: Four times a day (QID) | INTRAVENOUS | Status: DC | PRN
  Administered 2016-06-13: 500 mg via INTRAVENOUS
  Filled 2016-06-13: qty 550
  Filled 2016-06-13: qty 5

## 2016-06-13 MED ORDER — KETOROLAC TROMETHAMINE 30 MG/ML IJ SOLN: 30.0000 mg | Freq: Once | INTRAMUSCULAR | Status: DC

## 2016-06-13 MED ORDER — CEFAZOLIN IN D5W 1 GM/50ML IV SOLN
1.0000 g | Freq: Four times a day (QID) | INTRAVENOUS | Status: AC
  Administered 2016-06-13 (×2): 1 g via INTRAVENOUS
  Filled 2016-06-13 (×2): qty 50

## 2016-06-13 MED ORDER — METHOCARBAMOL 500 MG PO TABS: 500.0000 mg | ORAL_TABLET | Freq: Four times a day (QID) | ORAL | Status: DC | PRN

## 2016-06-13 MED ORDER — PHENOL 1.4 % MT LIQD: 1.0000 | OROMUCOSAL | Status: DC | PRN

## 2016-06-13 MED ORDER — ONDANSETRON HCL 4 MG/2ML IJ SOLN
INTRAMUSCULAR | Status: AC
  Filled 2016-06-13: qty 2

## 2016-06-13 MED ORDER — ONDANSETRON HCL 4 MG/2ML IJ SOLN: 4.0000 mg | Freq: Four times a day (QID) | INTRAMUSCULAR | Status: DC | PRN

## 2016-06-13 MED ORDER — SUCCINYLCHOLINE CHLORIDE 20 MG/ML IJ SOLN
INTRAMUSCULAR | Status: DC | PRN
  Administered 2016-06-13: 100 mg via INTRAVENOUS

## 2016-06-13 MED ORDER — ONDANSETRON HCL 4 MG/2ML IJ SOLN
INTRAMUSCULAR | Status: DC | PRN
  Administered 2016-06-13: 4 mg via INTRAVENOUS

## 2016-06-13 MED ORDER — HYDROMORPHONE HCL 1 MG/ML IJ SOLN
0.2500 mg | INTRAMUSCULAR | Status: DC | PRN
  Administered 2016-06-13 (×2): 0.5 mg via INTRAVENOUS

## 2016-06-13 MED ORDER — ASPIRIN 81 MG PO CHEW
81.0000 mg | CHEWABLE_TABLET | Freq: Two times a day (BID) | ORAL | Status: DC
  Administered 2016-06-13 – 2016-06-14 (×2): 81 mg via ORAL
  Filled 2016-06-13 (×2): qty 1

## 2016-06-13 MED ORDER — ONDANSETRON HCL 4 MG PO TABS: 4.0000 mg | ORAL_TABLET | Freq: Four times a day (QID) | ORAL | Status: DC | PRN

## 2016-06-13 MED ORDER — PROMETHAZINE HCL 25 MG/ML IJ SOLN: 6.2500 mg | INTRAMUSCULAR | Status: DC | PRN

## 2016-06-13 MED ORDER — TRANEXAMIC ACID 1000 MG/10ML IV SOLN
1000.0000 mg | INTRAVENOUS | Status: AC
  Administered 2016-06-13: 1000 mg via INTRAVENOUS
  Filled 2016-06-13: qty 10

## 2016-06-13 MED ORDER — POTASSIUM CHLORIDE 2 MEQ/ML IV SOLN
INTRAVENOUS | Status: DC
  Administered 2016-06-13 – 2016-06-14 (×2): via INTRAVENOUS
  Filled 2016-06-13 (×4): qty 1000

## 2016-06-13 MED ORDER — ROCURONIUM BROMIDE 100 MG/10ML IV SOLN
INTRAVENOUS | Status: DC | PRN
  Administered 2016-06-13: 45 mg via INTRAVENOUS
  Administered 2016-06-13 (×2): 10 mg via INTRAVENOUS
  Administered 2016-06-13: 5 mg via INTRAVENOUS

## 2016-06-13 MED ORDER — HYDROMORPHONE HCL 2 MG/ML IJ SOLN
INTRAMUSCULAR | Status: AC
  Filled 2016-06-13: qty 1

## 2016-06-13 MED ORDER — ROCURONIUM BROMIDE 100 MG/10ML IV SOLN
INTRAVENOUS | Status: AC
  Filled 2016-06-13: qty 1

## 2016-06-13 MED ORDER — MIDAZOLAM HCL 5 MG/5ML IJ SOLN
INTRAMUSCULAR | Status: DC | PRN
  Administered 2016-06-13: 2 mg via INTRAVENOUS

## 2016-06-13 MED ORDER — SUGAMMADEX SODIUM 200 MG/2ML IV SOLN
INTRAVENOUS | Status: AC
  Filled 2016-06-13: qty 2

## 2016-06-13 MED ORDER — DEXAMETHASONE SODIUM PHOSPHATE 10 MG/ML IJ SOLN
INTRAMUSCULAR | Status: AC
  Filled 2016-06-13: qty 1

## 2016-06-13 MED ORDER — LIDOCAINE HCL (CARDIAC) 20 MG/ML IV SOLN
INTRAVENOUS | Status: AC
  Filled 2016-06-13: qty 5

## 2016-06-13 MED ORDER — LIDOCAINE HCL (CARDIAC) 20 MG/ML IV SOLN
INTRAVENOUS | Status: DC | PRN
  Administered 2016-06-13: 50 mg via INTRAVENOUS

## 2016-06-13 MED ORDER — MEPERIDINE HCL 50 MG/ML IJ SOLN: 6.2500 mg | INTRAMUSCULAR | Status: DC | PRN

## 2016-06-13 MED ORDER — SODIUM CHLORIDE 0.9 % IR SOLN
Status: DC | PRN
  Administered 2016-06-13: 1000 mL

## 2016-06-13 MED ORDER — ACETAMINOPHEN 325 MG PO TABS: 650.0000 mg | ORAL_TABLET | Freq: Four times a day (QID) | ORAL | Status: DC | PRN

## 2016-06-13 MED ORDER — STERILE WATER FOR IRRIGATION IR SOLN
Status: DC | PRN
  Administered 2016-06-13: 2000 mL

## 2016-06-13 MED ORDER — PROPOFOL 10 MG/ML IV BOLUS
INTRAVENOUS | Status: AC
  Filled 2016-06-13: qty 20

## 2016-06-13 MED ORDER — FENTANYL CITRATE (PF) 100 MCG/2ML IJ SOLN
INTRAMUSCULAR | Status: AC
  Filled 2016-06-13: qty 2

## 2016-06-13 SURGICAL SUPPLY — 34 items
BAG SPEC THK2 15X12 ZIP CLS (MISCELLANEOUS) ×1
BAG ZIPLOCK 12X15 (MISCELLANEOUS) ×2 IMPLANT
CAPT HIP TOTAL 2 ×2 IMPLANT
CLOTH BEACON ORANGE TIMEOUT ST (SAFETY) ×3 IMPLANT
COVER PERINEAL POST (MISCELLANEOUS) ×3 IMPLANT
DRAPE STERI IOBAN 125X83 (DRAPES) ×3 IMPLANT
DRAPE U-SHAPE 47X51 STRL (DRAPES) ×6 IMPLANT
DRESSING AQUACEL AG SP 3.5X10 (GAUZE/BANDAGES/DRESSINGS) ×1 IMPLANT
DRSG AQUACEL AG ADV 3.5X10 (GAUZE/BANDAGES/DRESSINGS) ×2 IMPLANT
DRSG AQUACEL AG SP 3.5X10 (GAUZE/BANDAGES/DRESSINGS) ×3
DURAPREP 26ML APPLICATOR (WOUND CARE) ×3 IMPLANT
ELECT REM PT RETURN 9FT ADLT (ELECTROSURGICAL) ×3
ELECTRODE REM PT RTRN 9FT ADLT (ELECTROSURGICAL) ×1 IMPLANT
GLOVE BIOGEL PI IND STRL 7.5 (GLOVE) ×1 IMPLANT
GLOVE BIOGEL PI IND STRL 8.5 (GLOVE) ×1 IMPLANT
GLOVE BIOGEL PI INDICATOR 7.5 (GLOVE) ×6
GLOVE BIOGEL PI INDICATOR 8.5 (GLOVE) ×2
GLOVE ECLIPSE 8.0 STRL XLNG CF (GLOVE) ×6 IMPLANT
GLOVE ORTHO TXT STRL SZ7.5 (GLOVE) ×3 IMPLANT
GLOVE SURG SS PI 7.0 STRL IVOR (GLOVE) ×2 IMPLANT
GLOVE SURG SS PI 7.5 STRL IVOR (GLOVE) ×2 IMPLANT
GOWN STRL REUS W/TWL LRG LVL3 (GOWN DISPOSABLE) ×3 IMPLANT
GOWN STRL REUS W/TWL XL LVL3 (GOWN DISPOSABLE) ×5 IMPLANT
HOLDER FOLEY CATH W/STRAP (MISCELLANEOUS) ×3 IMPLANT
LIQUID BAND (GAUZE/BANDAGES/DRESSINGS) ×3 IMPLANT
PACK ANTERIOR HIP CUSTOM (KITS) ×3 IMPLANT
SAW OSC TIP CART 19.5X105X1.3 (SAW) ×3 IMPLANT
SUT MNCRL AB 4-0 PS2 18 (SUTURE) ×3 IMPLANT
SUT VIC AB 1 CT1 36 (SUTURE) ×9 IMPLANT
SUT VIC AB 2-0 CT1 27 (SUTURE) ×6
SUT VIC AB 2-0 CT1 TAPERPNT 27 (SUTURE) ×2 IMPLANT
SUT VLOC 180 0 24IN GS25 (SUTURE) ×3 IMPLANT
TRAY FOLEY W/METER SILVER 16FR (SET/KITS/TRAYS/PACK) IMPLANT
YANKAUER SUCT BULB TIP 10FT TU (MISCELLANEOUS) ×2 IMPLANT

## 2016-06-13 NOTE — Progress Notes (Signed)
Portable AP Pelvis and Lateral Right Hip X-rays done. 

## 2016-06-13 NOTE — Care Management Note (Signed)
Case Management Note  Patient Details  Name: Alexander Castillo MRN: [id number] Date of Birth: 15-Mar-1941  Subjective/Objective:   75 yo admitted for Right total hip replacement through an anterior approach                  Action/Plan: From home alone. Pt states he has a 4 wheeled walker and stool for the shower. PT recommending HHPT and 2 wheeled RW.  Choice offered for [facility] services and [facility] chosen. [facility] rep contacted for referral. RW ordered and [facility] DME rep contacted for RW. No other CM needs communicated.  Expected Discharge Date:                  Expected Discharge Plan:  [place]  In-House Referral:     Discharge planning Services  CM Consult  Post Acute Care Choice:  Home Health Choice offered to:  Patient  DME Arranged:  Walker rolling DME Agency:  [place]:  PT [place]:  [facility] ([facility])  Status of Service:  In process, will continue to follow  If discussed at Long Length of Stay Meetings, dates discussed:    Additional CommentsLynnell Catalan, RN 06/13/2016, 3:07 PM  [phone]

## 2016-06-13 NOTE — Progress Notes (Signed)
CSW consulted to assis with SNF placement. PN reviewed. PT has recommended HHPT at d/c. RNCM will assist with d/c planning needs. CSW signing off.  Werner Lean LCSW [phone]

## 2016-06-13 NOTE — Anesthesia Preprocedure Evaluation (Signed)
Anesthesia Evaluation  Patient identified by MRN, date of birth, ID band Patient awake    Reviewed: Allergy & Precautions, H&P , NPO status , Patient's Chart, lab work & pertinent test results  Airway Mallampati: I  TM Distance: >3 FB Neck ROM: full    Dental  (+) Edentulous Upper, Edentulous Lower   Pulmonary neg pulmonary ROS,    Pulmonary exam normal        Cardiovascular negative cardio ROS Normal cardiovascular exam     Neuro/Psych negative neurological ROS     GI/Hepatic negative GI ROS, Neg liver ROS,   Endo/Other  negative endocrine ROS  Renal/GU negative Renal ROS     Musculoskeletal   Abdominal Normal abdominal exam  (+)   Peds  Hematology negative hematology ROS (+)   Anesthesia Other Findings   Reproductive/Obstetrics negative OB ROS                             Anesthesia Physical Anesthesia Plan  ASA: II  Anesthesia Plan: Spinal   Post-op Pain Management:    Induction:   Airway Management Planned:   Additional Equipment:   Intra-op Plan:   Post-operative Plan:   Informed Consent: I have reviewed the patients History and Physical, chart, labs and discussed the procedure including the risks, benefits and alternatives for the proposed anesthesia with the patient or authorized representative who has indicated his/her understanding and acceptance.     Plan Discussed with: CRNA and Surgeon  Anesthesia Plan Comments:         Anesthesia Quick Evaluation

## 2016-06-13 NOTE — Interval H&P Note (Signed)
History and Physical Interval Note:  06/13/2016 7:17 AM  Alexander Castillo  has presented today for surgery, with the diagnosis of Right hip osteoarthritis  The various methods of treatment have been discussed with the patient and family. After consideration of risks, benefits and other options for treatment, the patient has consented to  Procedure(s): RIGHT TOTAL HIP ARTHROPLASTY ANTERIOR APPROACH (Right) as a surgical intervention .  The patient's history has been reviewed, patient examined, no change in status, stable for surgery.  I have reviewed the patient's chart and labs.  Questions were answered to the patient's satisfaction.     Mauri Pole

## 2016-06-13 NOTE — Transfer of Care (Signed)
Immediate Anesthesia Transfer of Care Note  Patient: Alexander Castillo  Procedure(s) Performed: Procedure(s) with comments: RIGHT TOTAL HIP ARTHROPLASTY ANTERIOR APPROACH (Right) - 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  Patient Location: PACU  Anesthesia Type:General  Level of Consciousness: awake, alert  and oriented  Airway & Oxygen Therapy: Patient Spontanous Breathing and Patient connected to face mask oxygen  Post-op Assessment: Report given to RN and Post -op Vital signs reviewed and stable  Post vital signs: Reviewed and stable  Last Vitals:  Filed Vitals:   06/13/16 0520  BP: 123/65  Pulse: 68  Temp: 36.4 C  Resp: 16    Last Pain: There were no vitals filed for this visit.       Complications: No apparent anesthesia complications

## 2016-06-13 NOTE — Progress Notes (Signed)
X-ray results noted 

## 2016-06-13 NOTE — Evaluation (Signed)
Physical Therapy Evaluation Patient Details Name: Alexander Castillo MRN: [id number] DOB: 1941/02/28 Today's Date: 06/13/2016   History of Present Illness  s/p  R DA  THA  Clinical Impression  Pt is s/p THA resulting in the deficits listed below (see PT Problem List).   Pt will benefit from skilled PT to increase their independence and safety with mobility to allow discharge to the venue listed below. Recommend HHPT; pt should progress well Amb 25' with RW and min assist today/POD #0     Follow Up Recommendations Home health PT;Supervision - Intermittent    Equipment Recommendations  Rolling walker with 5" wheels    Recommendations for Other Services       Precautions / Restrictions Restrictions Weight Bearing Restrictions: No Other Position/Activity Restrictions: WBAT      Mobility  Bed Mobility Overal bed mobility: Needs Assistance Bed Mobility: Supine to Sit     Supine to sit: Min assist;HOB elevated     General bed mobility comments: assist with RLE, cues for technique  Transfers Overall transfer level: Needs assistance Equipment used: Rolling walker (2 wheeled) Transfers: Sit to/from Stand Sit to Stand: Min assist         General transfer comment: assist for anterior-superior wt shift, cues for hand placement  Ambulation/Gait Ambulation/Gait assistance: Min assist Ambulation Distance (Feet): 45 Feet Assistive device: Rolling walker (2 wheeled) Gait Pattern/deviations: Step-to pattern;Step-through pattern;Decreased stride length;Trunk flexed     General Gait Details: multi-modal cues for sequence, posture, safety  Stairs            Wheelchair Mobility    Modified Rankin (Stroke Patients Only)       Balance Overall balance assessment: Needs assistance           Standing balance-Leahy Scale: Fair                               Pertinent Vitals/Pain Pain Assessment: 0-10 Pain Score: 2  Pain Location: right hip Pain  Descriptors / Indicators: Sore Pain Intervention(s): Limited activity within patient's tolerance;Monitored during session;Premedicated before session;Repositioned    Home Living Family/patient expects to be discharged to:: Private residence Living Arrangements: Alone Available Help at Discharge: Family (brother-in-law) Type of Home: House Home Access: Stairs to enter   [employer] of Steps: 2 Home Layout: One level Home Equipment: [place] - 4 wheels;Toilet riser;Adaptive equipment      Prior Function Level of Independence: Independent               Hand Dominance        Extremity/Trunk Assessment               Lower Extremity Assessment: RLE deficits/detail RLE Deficits / Details: AAROM WFL, hip flexion and knee extension grossly 2+/5       Communication   Communication: No difficulties  Cognition Arousal/Alertness: Awake/alert Behavior During Therapy: WFL for tasks assessed/performed Overall Cognitive Status: Within Functional Limits for tasks assessed                      General Comments      Exercises Total Joint Exercises Ankle Circles/Pumps: AROM;Both;10 reps      Assessment/Plan    PT Assessment Patient needs continued PT services  PT Diagnosis Difficulty walking   PT Problem List Decreased strength;Decreased activity tolerance;Decreased balance;Decreased mobility;Decreased knowledge of use of DME;Pain  PT Treatment Interventions DME instruction;Gait training;Therapeutic activities;Therapeutic exercise;Functional mobility training;Patient/family education;Stair  training   PT Goals (Current goals can be found in the Care Plan section) Acute Rehab PT Goals Patient Stated Goal: back to [place], gardening adn fishing PT Goal Formulation: With patient Time For Goal Achievement: 06/17/16 Potential to Achieve Goals: Good    Frequency 7X/week   Barriers to discharge        Co-evaluation               End of Session  Equipment Utilized During Treatment: Gait belt Activity Tolerance: Patient tolerated treatment well Patient left: in chair;with call bell/phone within reach;with chair alarm set           Time: 1420-1444 PT Time Calculation (min) (ACUTE ONLY): 24 min   Charges:   PT Evaluation $PT Eval Low Complexity: 1 Procedure PT Treatments $Gait Training: 8-22 mins   PT G Codes:        Alexander Castillo 07/02/2016, 2:50 PM

## 2016-06-13 NOTE — Anesthesia Postprocedure Evaluation (Signed)
Anesthesia Post Note  Patient: Hearld Michalk  Procedure(s) Performed: Procedure(s) (LRB): RIGHT TOTAL HIP ARTHROPLASTY ANTERIOR APPROACH (Right)  Patient location during evaluation: PACU Anesthesia Type: General Level of consciousness: sedated Pain management: pain level controlled Vital Signs Assessment: post-procedure vital signs reviewed and stable Respiratory status: spontaneous breathing Cardiovascular status: stable Postop Assessment: no signs of nausea or vomiting Anesthetic complications: no     Last Vitals:  Filed Vitals:   06/13/16 0938 06/13/16 0945  BP: 147/74 141/72  Pulse: 82 82  Temp: 36.7 C   Resp: 20 15    Last Pain:  Filed Vitals:   06/13/16 0950  PainSc: 0-No pain   Pain Goal:    LLE Motor Response: Purposeful movement (06/13/16 0938) LLE Sensation: Full sensation (06/13/16 0938) RLE Motor Response: Purposeful movement (06/13/16 [id number]) RLE Sensation: Full sensation (06/13/16 0938) L Sensory Level: S1-Sole of foot, small toes (06/13/16 0938) R Sensory Level: S1-Sole of foot, small toes (06/13/16 0938)  Maryclaire Stoecker JR,JOHN Mateo Flow

## 2016-06-13 NOTE — Anesthesia Procedure Notes (Signed)
Procedure Name: Intubation Date/Time: 06/13/2016 7:43 AM Performed by: Noralyn Pick D Pre-anesthesia Checklist: Patient identified, Emergency Drugs available, Suction available and Patient being monitored Patient Re-evaluated:Patient Re-evaluated prior to inductionOxygen Delivery Method: Circle system utilized Preoxygenation: Pre-oxygenation with 100% oxygen Intubation Type: IV induction Ventilation: Mask ventilation without difficulty Laryngoscope Size: Mac and 4 Grade View: Grade II Tube type: Oral Tube size: 7.5 mm Number of attempts: 1 Airway Equipment and Method: Stylet Placement Confirmation: ETT inserted through vocal cords under direct vision,  positive ETCO2 and breath sounds checked- equal and bilateral Tube secured with: Tape Dental Injury: Teeth and Oropharynx as per pre-operative assessment

## 2016-06-13 NOTE — Op Note (Signed)
NAME:  Alexander Castillo                ACCOUNT NO.: [id number]      MEDICAL RECORD NO.: [id number]      FACILITY:  [facility]      PHYSICIAN:  Paralee Cancel D  DATE OF BIRTH:  02/19/1941     DATE OF PROCEDURE:  06/13/2016                                 OPERATIVE REPORT         PREOPERATIVE DIAGNOSIS: Right  hip osteoarthritis.      POSTOPERATIVE DIAGNOSIS:  Right hip osteoarthritis.      PROCEDURE:  Right total hip replacement through an anterior approach   utilizing DePuy THR system, component size 21mm pinnacle cup, a size 36+4 neutral   Altrex liner, a size 6 Hi Tri Lock stem with a 36+1.5 delta ceramic   ball.      SURGEON:  Pietro Cassis. Alvan Dame, M.D.      ASSISTANT:  Molli Barrows, PA-C     ANESTHESIA:  General.      SPECIMENS:  None.      COMPLICATIONS:  None.      BLOOD LOSS:  400 cc     DRAINS:  None.      INDICATION OF THE PROCEDURE:  Alexander Castillo is a 75 y.o. male who had   presented to office for evaluation of right hip pain.  Radiographs revealed   progressive degenerative changes with bone-on-bone   articulation to the  hip joint.  The patient had painful limited range of   motion significantly affecting their overall quality of life.  The patient was failing to    respond to conservative measures, and at this point was ready   to proceed with more definitive measures.  The patient has noted progressive   degenerative changes in his hip, progressive problems and dysfunction   with regarding the hip prior to surgery.  Consent was obtained for   benefit of pain relief.  Specific risk of infection, DVT, component   failure, dislocation, need for revision surgery, as well discussion of   the anterior versus posterior approach were reviewed.  Consent was   obtained for benefit of anterior pain relief through an anterior   approach.      PROCEDURE IN DETAIL:  The patient was brought to operative theater.   Once adequate anesthesia,  preoperative antibiotics, 2gm of Ancef, 1 gm of Tranexamic Acid, and 10 mg of Decadron administered.   The patient was positioned supine on the OSI Hanna table.  Once adequate   padding of boney process was carried out, we had predraped out the hip, and  used fluoroscopy to confirm orientation of the pelvis and position.      The right hip was then prepped and draped from proximal iliac crest to   mid thigh with shower curtain technique.      Time-out was performed identifying the patient, planned procedure, and   extremity.     An incision was then made 2 cm distal and lateral to the   anterior superior iliac spine extending over the orientation of the   tensor fascia lata muscle and sharp dissection was carried down to the   fascia of the muscle and protractor placed in the soft tissues.      The fascia was then incised.  The muscle belly was identified and swept   laterally and retractor placed along the superior neck.  Following   cauterization of the circumflex vessels and removing some pericapsular   fat, a second cobra retractor was placed on the inferior neck.  A third   retractor was placed on the anterior acetabulum after elevating the   anterior rectus.  A L-capsulotomy was along the line of the   superior neck to the trochanteric fossa, then extended proximally and   distally.  Tag sutures were placed and the retractors were then placed   intracapsular.  We then identified the trochanteric fossa and   orientation of my neck cut, confirmed this radiographically   and then made a neck osteotomy with the femur on traction.  The femoral   head was removed without difficulty or complication.  Traction was let   off and retractors were placed posterior and anterior around the   acetabulum.      The labrum and foveal tissue were debrided.  I began reaming with a 69mm   reamer and reamed up to 68mm reamer with good bony bed preparation and a 24mm   cup was chosen.  The final 29mm  Pinnacle cup was then impacted under fluoroscopy  to confirm the depth of penetration and orientation with respect to   abduction.  A screw was placed followed by the hole eliminator.  The final   36+4 neutral Altrex liner was impacted with good visualized rim fit.  The cup was positioned anatomically within the acetabular portion of the pelvis.      At this point, the femur was rolled at 80 degrees.  Further capsule was   released off the inferior aspect of the femoral neck.  I then   released the superior capsule proximally.  The hook was placed laterally   along the femur and elevated manually and held in position with the bed   hook.  The leg was then extended and adducted with the leg rolled to 100   degrees of external rotation.  Once the proximal femur was fully   exposed, I used a box osteotome to set orientation.  I then began   broaching with the starting chili pepper broach and passed this by hand and then broached up to 6.  With the 6 broach in place I chose a high offset neck and did several trial reductions.  The offset was appropriate, leg lengths   appeared to be equal best matched with the +1.5 head ball, confirmed radiographically.   Given these findings, I went ahead and dislocated the hip, repositioned all   retractors and positioned the right hip in the extended and abducted position.  The final 6 Hi  Tri Lock stem was   chosen and it was impacted down to the level of neck cut.  Based on this   and the trial reduction, a 36+1.5 delta ceramic ball was chosen and   impacted onto a clean and dry trunnion, and the hip was reduced.  The   hip had been irrigated throughout the case again at this point.  I did   reapproximate the superior capsular leaflet to the anterior leaflet   using #1 Vicryl.  The fascia of the   tensor fascia lata muscle was then reapproximated using #1 Vicryl and #0 V-lock sutures.  The   remaining wound was closed with 2-0 Vicryl and running 4-0 Monocryl.    The hip was cleaned, dried, and dressed sterilely  using Dermabond and   Aquacel dressing.  He was then brought   to recovery room in stable condition tolerating the procedure well.    Molli Barrows, PA-C was present for the entirety of the case involved from   preoperative positioning, perioperative retractor management, general   facilitation of the case, as well as primary wound closure as assistant.            Pietro Cassis Alvan Dame, M.D.        06/13/2016 9:07 AM

## 2016-06-14 LAB — BASIC METABOLIC PANEL
Anion gap: 4 — ABNORMAL LOW (ref 5–15)
BUN: 14 mg/dL (ref 6–20)
CHLORIDE: 107 mmol/L (ref 101–111)
CO2: 29 mmol/L (ref 22–32)
CREATININE: 0.85 mg/dL (ref 0.61–1.24)
Calcium: 8.9 mg/dL (ref 8.9–10.3)
GFR calc Af Amer: >60 mL/min (ref >60)
GFR calc non Af Amer: >60 mL/min (ref >60)
GLUCOSE: 132 mg/dL — AB (ref 65–99)
POTASSIUM: 3.9 mmol/L (ref 3.5–5.1)
Sodium: 140 mmol/L (ref 135–145)

## 2016-06-14 LAB — CBC
HCT: 41.7 % (ref 39.0–52.0)
HEMOGLOBIN: 13.5 g/dL (ref 13.0–17.0)
MCH: 30.1 pg (ref 26.0–34.0)
MCHC: 32.4 g/dL (ref 30.0–36.0)
MCV: 93.1 fL (ref 78.0–100.0)
PLATELETS: 213 10*3/uL (ref 150–400)
RBC: 4.48 MIL/uL (ref 4.22–5.81)
RDW: 14.1 % (ref 11.5–15.5)
WBC: 11 10*3/uL — ABNORMAL HIGH (ref 4.0–10.5)

## 2016-06-14 MED ORDER — HYDROCODONE-ACETAMINOPHEN 7.5-325 MG PO TABS: 1.0000 | ORAL_TABLET | ORAL | Status: DC | PRN

## 2016-06-14 MED ORDER — METHOCARBAMOL 500 MG PO TABS: 500.0000 mg | ORAL_TABLET | Freq: Four times a day (QID) | ORAL | Status: DC | PRN

## 2016-06-14 NOTE — Progress Notes (Signed)
Physical Therapy Treatment Patient Details Name: Alexander Castillo MRN: [id number] DOB: Oct 06, 1941 Today's Date: 2016/06/27    History of Present Illness s/p  R DA  THA    PT Comments    Making excellent progress, wants to D/C as soon as possible  Follow Up Recommendations  Home health PT;Supervision - Intermittent     Equipment Recommendations  Rolling walker with 5" wheels    Recommendations for Other Services       Precautions / Restrictions Restrictions Weight Bearing Restrictions: No Other Position/Activity Restrictions: WBAT    Mobility  Bed Mobility                  Transfers Overall transfer level: Needs assistance Equipment used: Rolling walker (2 wheeled) Transfers: Sit to/from Stand Sit to Stand: Min guard;Supervision         General transfer comment: cues for hand placement  Ambulation/Gait Ambulation/Gait assistance: Min guard;Supervision Ambulation Distance (Feet): 220 Feet Assistive device: Rolling walker (2 wheeled) Gait Pattern/deviations: Step-to pattern;Step-through pattern;Decreased stride length     General Gait Details: multi-modal cues for sequence, posture, safety   Stairs Stairs: Yes Stairs assistance: Min assist Stair Management: No rails;Backwards;With walker;Step to pattern Number of Stairs: 2 General stair comments: cues for sequence  Wheelchair Mobility    Modified Rankin (Stroke Patients Only)       Balance                                    Cognition Arousal/Alertness: Awake/alert Behavior During Therapy: WFL for tasks assessed/performed Overall Cognitive Status: Within Functional Limits for tasks assessed                      Exercises Total Joint Exercises Ankle Circles/Pumps: AROM;Both;10 reps Quad Sets: AROM;10 reps;Strengthening Short Arc Quad: AROM;Strengthening;Right;10 reps Heel Slides: Strengthening;Right;10 reps;AAROM Hip ABduction/ADduction:  AAROM;AROM;Strengthening;Right;10 reps    General Comments        Pertinent Vitals/Pain Pain Assessment: 0-10 Pain Score: 4  Pain Location: right hip Pain Descriptors / Indicators: Aching;Sore Pain Intervention(s): Limited activity within patient's tolerance;Monitored during session;Premedicated before session;Ice applied    Home Living                      Prior Function            PT Goals (current goals can now be found in the care plan section) Acute Rehab PT Goals Patient Stated Goal: back to [place], gardening and  fishing PT Goal Formulation: With patient Time For Goal Achievement: 06/17/16 Potential to Achieve Goals: Good Progress towards PT goals: Progressing toward goals    Frequency  7X/week    PT Plan Current plan remains appropriate    Co-evaluation             End of Session Equipment Utilized During Treatment: Gait belt Activity Tolerance: Patient tolerated treatment well Patient left: in chair;with call bell/phone within reach;with chair alarm set;with family/visitor present     Time: [id number] PT Time Calculation (min) (ACUTE ONLY): 27 min  Charges:  $Gait Training: 8-22 mins $Therapeutic Exercise: 8-22 mins                    G Codes:      Saanvi Hakala 06/27/2016, 10:00 AM

## 2016-06-14 NOTE — Discharge Summary (Signed)
Physician Discharge Summary   Patient ID: Alexander Castillo MRN: [id number] DOB/AGE: 1941-04-06 75 y.o.  Admit date: 06/13/2016 Discharge date: 06/14/2016  Admission Diagnoses:  Active Problems:   Status post total replacement of right hip   Discharge Diagnoses:  Same   Surgeries: Procedure(s): RIGHT TOTAL HIP ARTHROPLASTY ANTERIOR APPROACH on 06/13/2016   Consultants: PT/OT  Discharged Condition: Stable  Hospital Course: Laderrion Plucinski is an 75 y.o. male who was admitted 06/13/2016 with a chief complaint of right hip pain, and found to have a diagnosis of right hip end stage osteoarthritis.  They were brought to the operating room on 06/13/2016 and underwent the above named procedures.    The patient had an uncomplicated hospital course and was stable for discharge.  Recent vital signs:  Filed Vitals:   06/14/16 0250 06/14/16 0605  BP: 118/64 128/67  Pulse: 84 75  Temp: 98 F (36.7 C) 98.2 F (36.8 C)  Resp: 16 16    Recent laboratory studies:  Results for orders placed or performed during the hospital encounter of 06/02/16  Surgical pcr screen  Result Value Ref Range   MRSA, PCR NEGATIVE NEGATIVE   Staphylococcus aureus NEGATIVE NEGATIVE  CBC  Result Value Ref Range   WBC 6.8 4.0 - 10.5 K/uL   RBC 4.78 4.22 - 5.81 MIL/uL   Hemoglobin 14.4 13.0 - 17.0 g/dL   HCT 44.0 39.0 - 52.0 %   MCV 92.1 78.0 - 100.0 fL   MCH 30.1 26.0 - 34.0 pg   MCHC 32.7 30.0 - 36.0 g/dL   RDW 14.0 11.5 - 15.5 %   Platelets 246 150 - 400 K/uL  Type and screen Order type and screen if day of surgery is less than 15 days from draw of preadmission visit or order morning of surgery if day of surgery is greater than 6 days from preadmission visit.  Result Value Ref Range   ABO/RH(D) AB POS    Antibody Screen NEG    Sample Expiration 06/16/2016    Extend sample reason NO TRANSFUSIONS OR PREGNANCY IN THE PAST 3 MONTHS     Discharge Medications:     Medication List    TAKE these medications         aspirin 81 MG tablet  Take 81 mg by mouth daily.     HYDROcodone-acetaminophen 7.5-325 MG tablet  Commonly known as:  NORCO  Take 1-2 tablets by mouth every 4 (four) hours as needed (breakthrough pain).     methocarbamol 500 MG tablet  Commonly known as:  ROBAXIN  Take 1 tablet (500 mg total) by mouth every 6 (six) hours as needed for muscle spasms.        Diagnostic Studies: Dg C-arm 1-60 Min-no Report  06/13/2016  CLINICAL DATA: surgery C-ARM 1-60 MINUTES Fluoroscopy was utilized by the requesting physician.  No radiographic interpretation.   Dg Hip Port Unilat With Pelvis 1v Right  06/13/2016  CLINICAL DATA:  Post right hip replacement EXAM: DG HIP (WITH OR WITHOUT PELVIS) 1V PORT RIGHT COMPARISON:  None. FINDINGS: Two views of the right hip submitted. There is right hip prosthesis with anatomic alignment. Postsurgical changes are noted with small amount of periarticular soft tissue air. Left hip prosthesis with anatomic alignment. IMPRESSION: Right hip prosthesis with anatomic alignment. Postsurgical changes are noted small amount of periarticular soft tissue air. Electronically Signed   By: Lahoma Crocker M.D.   On: 06/13/2016 10:26    Disposition:       Discharge Instructions  Call MD / Call 911    Complete by:  As directed   If you experience chest pain or shortness of breath, CALL 911 and be transported to the hospital emergency room.  If you develope a fever above 101 F, pus (white drainage) or increased drainage or redness at the wound, or calf pain, call your surgeon's office.     Constipation Prevention    Complete by:  As directed   Drink plenty of fluids.  Prune juice may be helpful.  You may use a stool softener, such as Colace (over the counter) 100 mg twice a day.  Use MiraLax (over the counter) for constipation as needed.     Diet - low sodium heart healthy    Complete by:  As directed      Increase activity slowly as tolerated    Complete by:  As directed                Signed: Genelda Roark B 06/14/2016, 7:57 AM

## 2016-06-14 NOTE — Evaluation (Signed)
Occupational Therapy Evaluation Patient Details Name: Alexander Castillo MRN: [id number] DOB: Nov 25, 1941 Today's Date: 06/14/2016    History of Present Illness s/p  R DA  THA   Clinical Impression   This 75 year old man was admitted for the above sx. All education was completed. No further OT is needed at this time    Follow Up Recommendations  Supervision/Assistance - 24 hour    Equipment Recommendations   (shower seat--will borrow)    Recommendations for Other Services       Precautions / Restrictions Precautions Precautions: Fall Restrictions Weight Bearing Restrictions: No Other Position/Activity Restrictions: WBAT      Mobility Bed Mobility               General bed mobility comments: oob  Transfers Overall transfer level: Needs assistance Equipment used: Rolling walker (2 wheeled) Transfers: Sit to/from Stand Sit to Stand: Supervision         General transfer comment: cues for hand placement    Balance                                            ADL Overall ADL's : Needs assistance/impaired                         Toilet Transfer: Set up;Ambulation;RW (chair)       Tub/ Shower Transfer: Min guard;Ambulation;Walk-in shower     General ADL Comments: pt was up walking in room when I arrived; he is anxious to discharge.  Brother in law present and helped him get dressed. Pt has a reacher and used it for other hip ('02).  He verbalizes it's uses for adls.  Pt used commode earlier:  supevision for getting into chair.  Practiced shower transfer. Recommended seat initially.  He feels he can borrow one.  Educated that 3:1 will also double as shower seat, but he doesn't want this.  He has a toilet riser. Cues given to step up into walker and to not overdo it at home     Vision     Perception     Praxis      Pertinent Vitals/Pain Pain Assessment: 0-10 Pain Score: 4  Pain Location: R hip Pain Descriptors / Indicators:  Sore Pain Intervention(s): Limited activity within patient's tolerance;Monitored during session;Premedicated before session;Repositioned     Hand Dominance     Extremity/Trunk Assessment Upper Extremity Assessment Upper Extremity Assessment: Overall WFL for tasks assessed           Communication Communication Communication: No difficulties   Cognition Arousal/Alertness: Awake/alert Behavior During Therapy: WFL for tasks assessed/performed Overall Cognitive Status: Within Functional Limits for tasks assessed                     General Comments       Exercises       Shoulder Instructions      Home Living Family/patient expects to be discharged to:: Private residence Living Arrangements: Alone Available Help at Discharge: Family Type of Home: House             Bathroom Shower/Tub: Walk-in Corporate treasurer Toilet: Standard     Home Equipment: Environmental consultant - 4 wheels;Toilet riser;Adaptive equipment Adaptive Equipment: Reacher Additional Comments: no grab bars nor seat in shower stall      Prior Functioning/Environment Level of  Independence: Independent             OT Diagnosis: Acute pain   OT Problem List:     OT Treatment/Interventions:      OT Goals(Current goals can be found in the care plan section) Acute Rehab OT Goals Patient Stated Goal: back to [place], gardening and  fishing OT Goal Formulation: All assessment and education complete, DC therapy  OT Frequency:     Barriers to D/C:            Co-evaluation              End of Session    Activity Tolerance: Patient tolerated treatment well Patient left: in bed;with family/visitor present (EOB)   Time: [id number] OT Time Calculation (min): 11 min Charges:  OT General Charges $OT Visit: 1 Procedure OT Evaluation $OT Eval Low Complexity: 1 Procedure G-Codes:    Evee Liska Jun 24, 2016, 10:54 AM  Lesle Chris, OTR/L [phone] 24-Jun-2016

## 2016-06-14 NOTE — Progress Notes (Signed)
Patient ID: Alexander Castillo, male   DOB: 04/12/1941, 75 y.o.   MRN: [id number] Subjective: 1 Day Post-Op Procedure(s) (LRB): RIGHT TOTAL HIP ARTHROPLASTY ANTERIOR APPROACH (Right)    Patient reports pain as mild.  No events overnight  Objective:   VITALS:   Filed Vitals:   06/14/16 0250 06/14/16 0605  BP: 118/64 128/67  Pulse: 84 75  Temp: 98 F (36.7 C) 98.2 F (36.8 C)  Resp: 16 16    Neurovascular intact Incision: dressing C/D/I  LABS  Recent Labs  06/14/16 0744  HGB 13.5  HCT 41.7  WBC 11.0*  PLT 213    No results for input(s): NA, K, BUN, CREATININE, GLUCOSE in the last 72 hours.  No results for input(s): LABPT, INR in the last 72 hours.   Assessment/Plan: 1 Day Post-Op Procedure(s) (LRB): RIGHT TOTAL HIP ARTHROPLASTY ANTERIOR APPROACH (Right)   Advance diet Up with therapy Discharge home today  RTC in 2 weeks ASA for DVT prophylaxis

## 2016-06-15 ENCOUNTER — Telehealth: Payer: Self-pay | Admitting: *Deleted

## 2016-06-15 NOTE — Telephone Encounter (Signed)
Pt was on TCM list admitted for surgery had a Total (R) Hip Arthroplasty. He was D/C on 7/4, will f/u w/Dr. Alvan Dame in 2 weeks...Johny Chess

## 2016-06-20 DIAGNOSIS — N400 Enlarged prostate without lower urinary tract symptoms: Secondary | ICD-10-CM | POA: Diagnosis not present

## 2016-06-20 DIAGNOSIS — Z96643 Presence of artificial hip joint, bilateral: Secondary | ICD-10-CM | POA: Diagnosis not present

## 2016-06-20 DIAGNOSIS — Z471 Aftercare following joint replacement surgery: Secondary | ICD-10-CM | POA: Diagnosis not present

## 2016-06-20 DIAGNOSIS — E785 Hyperlipidemia, unspecified: Secondary | ICD-10-CM | POA: Diagnosis not present

## 2016-06-20 DIAGNOSIS — M1991 Primary osteoarthritis, unspecified site: Secondary | ICD-10-CM | POA: Diagnosis not present

## 2016-06-20 DIAGNOSIS — Z7982 Long term (current) use of aspirin: Secondary | ICD-10-CM | POA: Diagnosis not present

## 2016-06-20 DIAGNOSIS — Z9181 History of falling: Secondary | ICD-10-CM | POA: Diagnosis not present

## 2016-06-22 DIAGNOSIS — Z96643 Presence of artificial hip joint, bilateral: Secondary | ICD-10-CM | POA: Diagnosis not present

## 2016-06-22 DIAGNOSIS — Z7982 Long term (current) use of aspirin: Secondary | ICD-10-CM | POA: Diagnosis not present

## 2016-06-22 DIAGNOSIS — E785 Hyperlipidemia, unspecified: Secondary | ICD-10-CM | POA: Diagnosis not present

## 2016-06-22 DIAGNOSIS — Z9181 History of falling: Secondary | ICD-10-CM | POA: Diagnosis not present

## 2016-06-22 DIAGNOSIS — Z471 Aftercare following joint replacement surgery: Secondary | ICD-10-CM | POA: Diagnosis not present

## 2016-06-22 DIAGNOSIS — N400 Enlarged prostate without lower urinary tract symptoms: Secondary | ICD-10-CM | POA: Diagnosis not present

## 2016-06-22 DIAGNOSIS — M1991 Primary osteoarthritis, unspecified site: Secondary | ICD-10-CM | POA: Diagnosis not present

## 2016-06-24 DIAGNOSIS — E785 Hyperlipidemia, unspecified: Secondary | ICD-10-CM | POA: Diagnosis not present

## 2016-06-24 DIAGNOSIS — Z9181 History of falling: Secondary | ICD-10-CM | POA: Diagnosis not present

## 2016-06-24 DIAGNOSIS — Z471 Aftercare following joint replacement surgery: Secondary | ICD-10-CM | POA: Diagnosis not present

## 2016-06-24 DIAGNOSIS — M1991 Primary osteoarthritis, unspecified site: Secondary | ICD-10-CM | POA: Diagnosis not present

## 2016-06-24 DIAGNOSIS — Z7982 Long term (current) use of aspirin: Secondary | ICD-10-CM | POA: Diagnosis not present

## 2016-06-24 DIAGNOSIS — Z96643 Presence of artificial hip joint, bilateral: Secondary | ICD-10-CM | POA: Diagnosis not present

## 2016-06-24 DIAGNOSIS — N400 Enlarged prostate without lower urinary tract symptoms: Secondary | ICD-10-CM | POA: Diagnosis not present

## 2016-06-27 DIAGNOSIS — Z9181 History of falling: Secondary | ICD-10-CM | POA: Diagnosis not present

## 2016-06-27 DIAGNOSIS — Z471 Aftercare following joint replacement surgery: Secondary | ICD-10-CM | POA: Diagnosis not present

## 2016-06-27 DIAGNOSIS — Z96643 Presence of artificial hip joint, bilateral: Secondary | ICD-10-CM | POA: Diagnosis not present

## 2016-06-27 DIAGNOSIS — M1991 Primary osteoarthritis, unspecified site: Secondary | ICD-10-CM | POA: Diagnosis not present

## 2016-06-27 DIAGNOSIS — E785 Hyperlipidemia, unspecified: Secondary | ICD-10-CM | POA: Diagnosis not present

## 2016-06-27 DIAGNOSIS — Z7982 Long term (current) use of aspirin: Secondary | ICD-10-CM | POA: Diagnosis not present

## 2016-06-27 DIAGNOSIS — N400 Enlarged prostate without lower urinary tract symptoms: Secondary | ICD-10-CM | POA: Diagnosis not present

## 2016-06-29 DIAGNOSIS — Z7982 Long term (current) use of aspirin: Secondary | ICD-10-CM | POA: Diagnosis not present

## 2016-06-29 DIAGNOSIS — N400 Enlarged prostate without lower urinary tract symptoms: Secondary | ICD-10-CM | POA: Diagnosis not present

## 2016-06-29 DIAGNOSIS — Z96643 Presence of artificial hip joint, bilateral: Secondary | ICD-10-CM | POA: Diagnosis not present

## 2016-06-29 DIAGNOSIS — M1991 Primary osteoarthritis, unspecified site: Secondary | ICD-10-CM | POA: Diagnosis not present

## 2016-06-29 DIAGNOSIS — Z9181 History of falling: Secondary | ICD-10-CM | POA: Diagnosis not present

## 2016-06-29 DIAGNOSIS — Z471 Aftercare following joint replacement surgery: Secondary | ICD-10-CM | POA: Diagnosis not present

## 2016-06-29 DIAGNOSIS — E785 Hyperlipidemia, unspecified: Secondary | ICD-10-CM | POA: Diagnosis not present

## 2016-06-30 DIAGNOSIS — N400 Enlarged prostate without lower urinary tract symptoms: Secondary | ICD-10-CM | POA: Diagnosis not present

## 2016-06-30 DIAGNOSIS — Z471 Aftercare following joint replacement surgery: Secondary | ICD-10-CM | POA: Diagnosis not present

## 2016-06-30 DIAGNOSIS — M1991 Primary osteoarthritis, unspecified site: Secondary | ICD-10-CM | POA: Diagnosis not present

## 2016-06-30 DIAGNOSIS — E785 Hyperlipidemia, unspecified: Secondary | ICD-10-CM | POA: Diagnosis not present

## 2016-06-30 DIAGNOSIS — Z9181 History of falling: Secondary | ICD-10-CM | POA: Diagnosis not present

## 2016-06-30 DIAGNOSIS — Z7982 Long term (current) use of aspirin: Secondary | ICD-10-CM | POA: Diagnosis not present

## 2016-06-30 DIAGNOSIS — Z96643 Presence of artificial hip joint, bilateral: Secondary | ICD-10-CM | POA: Diagnosis not present

## 2016-07-05 ENCOUNTER — Other Ambulatory Visit (INDEPENDENT_AMBULATORY_CARE_PROVIDER_SITE_OTHER): Payer: Medicare Other

## 2016-07-05 DIAGNOSIS — Z0000 Encounter for general adult medical examination without abnormal findings: Secondary | ICD-10-CM

## 2016-07-05 LAB — HEPATIC FUNCTION PANEL
ALBUMIN: 4 g/dL (ref 3.5–5.2)
ALT: 14 U/L (ref 0–53)
AST: 25 U/L (ref 0–37)
Alkaline Phosphatase: 92 U/L (ref 39–117)
BILIRUBIN TOTAL: 0.6 mg/dL (ref 0.2–1.2)
Bilirubin, Direct: 0.1 mg/dL (ref 0.0–0.3)
TOTAL PROTEIN: 6.3 g/dL (ref 6.0–8.3)

## 2016-07-05 LAB — LIPID PANEL
CHOL/HDL RATIO: 3
CHOLESTEROL: 146 mg/dL (ref 0–200)
HDL: 46.4 mg/dL (ref >39.00)
LDL Cholesterol: 86 mg/dL (ref 0–99)
NonHDL: 99.3
TRIGLYCERIDES: 68 mg/dL (ref 0.0–149.0)
VLDL: 13.6 mg/dL (ref 0.0–40.0)

## 2016-07-05 LAB — BASIC METABOLIC PANEL
BUN: 11 mg/dL (ref 6–23)
CHLORIDE: 106 meq/L (ref 96–112)
CO2: 29 meq/L (ref 19–32)
CREATININE: 0.8 mg/dL (ref 0.40–1.50)
Calcium: 9.6 mg/dL (ref 8.4–10.5)
GFR: 99.51 mL/min (ref >60.00)
GLUCOSE: 86 mg/dL (ref 70–99)
POTASSIUM: 4.3 meq/L (ref 3.5–5.1)
Sodium: 142 mEq/L (ref 135–145)

## 2016-07-05 LAB — CBC WITH DIFFERENTIAL/PLATELET
BASOS ABS: 0 10*3/uL (ref 0.0–0.1)
Basophils Relative: 0.7 % (ref 0.0–3.0)
EOS ABS: 0.1 10*3/uL (ref 0.0–0.7)
Eosinophils Relative: 0.8 % (ref 0.0–5.0)
HEMATOCRIT: 38.4 % — AB (ref 39.0–52.0)
HEMOGLOBIN: 12.5 g/dL — AB (ref 13.0–17.0)
LYMPHS PCT: 16.4 % (ref 12.0–46.0)
Lymphs Abs: 1.1 10*3/uL (ref 0.7–4.0)
MCHC: 32.6 g/dL (ref 30.0–36.0)
MCV: 90.7 fl (ref 78.0–100.0)
MONO ABS: 0.5 10*3/uL (ref 0.1–1.0)
Monocytes Relative: 7.5 % (ref 3.0–12.0)
Neutro Abs: 4.9 10*3/uL (ref 1.4–7.7)
Neutrophils Relative %: 74.6 % (ref 43.0–77.0)
Platelets: 374 10*3/uL (ref 150.0–400.0)
RBC: 4.24 Mil/uL (ref 4.22–5.81)
RDW: 13.7 % (ref 11.5–15.5)
WBC: 6.6 10*3/uL (ref 4.0–10.5)

## 2016-07-05 LAB — URINALYSIS, ROUTINE W REFLEX MICROSCOPIC
Bilirubin Urine: NEGATIVE
HGB URINE DIPSTICK: NEGATIVE
KETONES UR: NEGATIVE
Leukocytes, UA: NEGATIVE
NITRITE: NEGATIVE
SPECIFIC GRAVITY, URINE: 1.02 (ref 1.000–1.030)
TOTAL PROTEIN, URINE-UPE24: NEGATIVE
URINE GLUCOSE: NEGATIVE
UROBILINOGEN UA: 0.2 (ref 0.0–1.0)
pH: 6 (ref 5.0–8.0)

## 2016-07-05 LAB — PSA: PSA: 5.04 ng/mL — AB (ref 0.10–4.00)

## 2016-07-05 LAB — TSH: TSH: 1.79 u[IU]/mL (ref 0.35–4.50)

## 2016-07-12 ENCOUNTER — Ambulatory Visit (INDEPENDENT_AMBULATORY_CARE_PROVIDER_SITE_OTHER): Payer: Medicare Other | Admitting: Internal Medicine

## 2016-07-12 ENCOUNTER — Encounter: Payer: Self-pay | Admitting: Internal Medicine

## 2016-07-12 VITALS — BP 130/68 | HR 69 | Temp 98.9°F | Resp 20 | Wt 169.0 lb

## 2016-07-12 DIAGNOSIS — F329 Major depressive disorder, single episode, unspecified: Secondary | ICD-10-CM | POA: Diagnosis not present

## 2016-07-12 DIAGNOSIS — F32A Depression, unspecified: Secondary | ICD-10-CM

## 2016-07-12 DIAGNOSIS — R972 Elevated prostate specific antigen [PSA]: Secondary | ICD-10-CM

## 2016-07-12 DIAGNOSIS — E785 Hyperlipidemia, unspecified: Secondary | ICD-10-CM

## 2016-07-12 DIAGNOSIS — Z0001 Encounter for general adult medical examination with abnormal findings: Secondary | ICD-10-CM

## 2016-07-12 MED ORDER — CIPROFLOXACIN HCL 250 MG PO TABS: 250.0000 mg | ORAL_TABLET | Freq: Two times a day (BID) | ORAL | 0 refills | Status: AC

## 2016-07-12 NOTE — Assessment & Plan Note (Signed)

## 2016-07-12 NOTE — Assessment & Plan Note (Addendum)
Cont diet, goal ldl < 100 Lab Results  Component Value Date   LDLCALC 86 07/05/2016

## 2016-07-12 NOTE — Assessment & Plan Note (Signed)
stable overall by history and exam, recent data reviewed with pt, and pt to continue medical treatment as before,  to f/u any worsening symptoms or concerns  

## 2016-07-12 NOTE — Assessment & Plan Note (Addendum)
[place] for cipro course x 4 wks, and repeat pSA, and consider f/u with urology Dr Gaynelle Arabian if persistently elevated or worse  In addition to the time spent performing CPE, I spent an additional 15 minutes face to face,in which greater than 50% of this time was spent in counseling and coordination of care for patient's illness as documented.

## 2016-07-12 NOTE — Progress Notes (Signed)
Subjective:    Patient ID: Alexander Castillo, male    DOB: 06/22/1941, 75 y.o.   MRN: [id number]  HPI  Here for wellness and f/u;  Overall doing ok;  Pt denies Chest pain, worsening SOB, DOE, wheezing, orthopnea, PND, worsening LE edema, palpitations, dizziness or syncope.  Pt denies neurological change such as new headache, facial or extremity weakness.  Pt denies polydipsia, polyuria, or low sugar symptoms. Pt states overall good compliance with treatment and medications, good tolerability, and has been trying to follow appropriate diet.  Pt denies worsening depressive symptoms, suicidal ideation or panic. No fever, night sweats, wt loss, loss of appetite, or other constitutional symptoms.  Pt states good ability with ADL's, has low fall risk, home safety reviewed and adequate, no other significant changes in hearing or vision, and only occasionally active with exercise.  Denies urinary symptoms such as dysuria, frequency, urgency, flank pain, hematuria or n/v, fever, chills.  Last seen per urology 2 yrs ago, and not felt to need f/u unless condition changed. S/p right hip surgury x 1 mo, still with cane, pain much improved, doesn't want further pain med, cont's to do the excercises and f/u, has not yet been fishing yet, cant wait for this to get re-started Past Medical History:  Diagnosis Date  . BENIGN PROSTATIC HYPERTROPHY 03/10/2008  . BENIGN PROSTATIC HYPERTROPHY, HX OF 03/10/2008  . CHEST PAIN 03/10/2008  . DEGENERATIVE JOINT DISEASE 03/10/2008  . EAR PAIN, LEFT 12/09/2008  . HYPERLIPIDEMIA 03/10/2008  . PSA, INCREASED 03/16/2009  . RASH-NONVESICULAR 03/16/2009   Past Surgical History:  Procedure Laterality Date  . back surgury  1998   lower  . EYE SURGERY Bilateral    lens replacement for cataracts  . hip replacement left Left 2002  . s/p left knee arthroscopy  yrs ago  . TOTAL HIP ARTHROPLASTY Right 06/13/2016   Procedure: RIGHT TOTAL HIP ARTHROPLASTY ANTERIOR APPROACH;  Surgeon: Paralee Cancel, MD;  Location: [facility];  Service: Orthopedics;  Laterality: Right;  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  . transurethral resection of prostate      reports that he has never smoked. He has never used smokeless tobacco. He reports that he does not drink alcohol or use drugs. family history includes Dementia in his mother. No Known Allergies Current Outpatient Prescriptions on File Prior to Visit  Medication Sig Dispense Refill  . aspirin 81 MG tablet Take 81 mg by mouth daily.       No current facility-administered medications on file prior to visit.    Review of Systems Constitutional: Negative for increased diaphoresis, or other activity, appetite or siginficant weight change other than noted HENT: Negative for worsening hearing loss, ear pain, facial swelling, mouth sores and neck stiffness.   Eyes: Negative for other worsening pain, redness or visual disturbance.  Respiratory: Negative for choking or stridor Cardiovascular: Negative for other chest pain and palpitations.  Gastrointestinal: Negative for worsening diarrhea, blood in stool, or abdominal distention Genitourinary: Negative for hematuria, flank pain or change in urine volume.  Musculoskeletal: Negative for myalgias or other joint complaints.  Skin: Negative for other color change and wound or drainage.  Neurological: Negative for syncope and numbness. other than noted Hematological: Negative for adenopathy. or other swelling Psychiatric/Behavioral: Negative for hallucinations, SI, self-injury, decreased concentration or other worsening agitation.      Objective:   Physical Exam BP 130/68   Pulse 69   Temp 98.9 F (37.2 C) (Oral)   Resp 20   Wt  169 lb (76.7 kg)   SpO2 98%   BMI 24.96 kg/m  VS noted, walks with cane, only 1 mo s/p right hip surgury, favors the hip mildly Constitutional: Pt is oriented to person, place, and time. Appears well-developed and well-nourished, in no significant distress Head: Normocephalic and  atraumatic  Eyes: Conjunctivae and EOM are normal. Pupils are equal, round, and reactive to light Right Ear: External ear normal.  Left Ear: External ear normal Nose: Nose normal.  Mouth/Throat: Oropharynx is clear and moist  Neck: Normal range of motion. Neck supple. No JVD present. No tracheal deviation present or significant neck LA or mass Cardiovascular: Normal rate, regular rhythm, normal heart sounds and intact distal pulses.   Pulmonary/Chest: Effort normal and breath sounds without rales or wheezing  Abdominal: Soft. Bowel sounds are normal. NT. No HSM  Musculoskeletal: Normal range of motion. Exhibits no edema Lymphadenopathy: Has no cervical adenopathy.  Neurological: Pt is alert and oriented to person, place, and time. Pt has normal reflexes. No cranial nerve deficit. Motor grossly intact Skin: Skin is warm and dry. No rash noted or new ulcers Psychiatric:  Has normal mood and affect. Behavior is normal.      Assessment & Plan:

## 2016-07-12 NOTE — Patient Instructions (Signed)
Please take all new medication as prescribed  - the antibiotic  Please continue all other medications as before, and refills have been done if requested.  Please have the pharmacy call with any other refills you may need.  Please continue your efforts at being more active, low cholesterol diet, and weight control.  You are otherwise up to date with prevention measures today.  Please keep your appointments with your specialists as you may have planned  Please return in 4 weeks for LAB ONLY to re-check the PSA  Please return in 1 year for your yearly visit, or sooner if needed, with Lab testing done 3-5 days before

## 2016-07-12 NOTE — Progress Notes (Signed)
Pre visit review using our clinic review tool, if applicable. No additional management support is needed unless otherwise documented below in the visit note. 

## 2016-07-27 DIAGNOSIS — Z471 Aftercare following joint replacement surgery: Secondary | ICD-10-CM | POA: Diagnosis not present

## 2016-07-27 DIAGNOSIS — Z96641 Presence of right artificial hip joint: Secondary | ICD-10-CM | POA: Diagnosis not present

## 2016-08-04 IMAGING — DX DG HIP (WITH OR WITHOUT PELVIS) 1V PORT*R*
3 series · 3 of 3 positions shown · non-contrast
Comparison: None.

CLINICAL DATA: Post right hip replacement

EXAM:
DG HIP (WITH OR WITHOUT PELVIS) 1V PORT RIGHT

[pelvis ap]
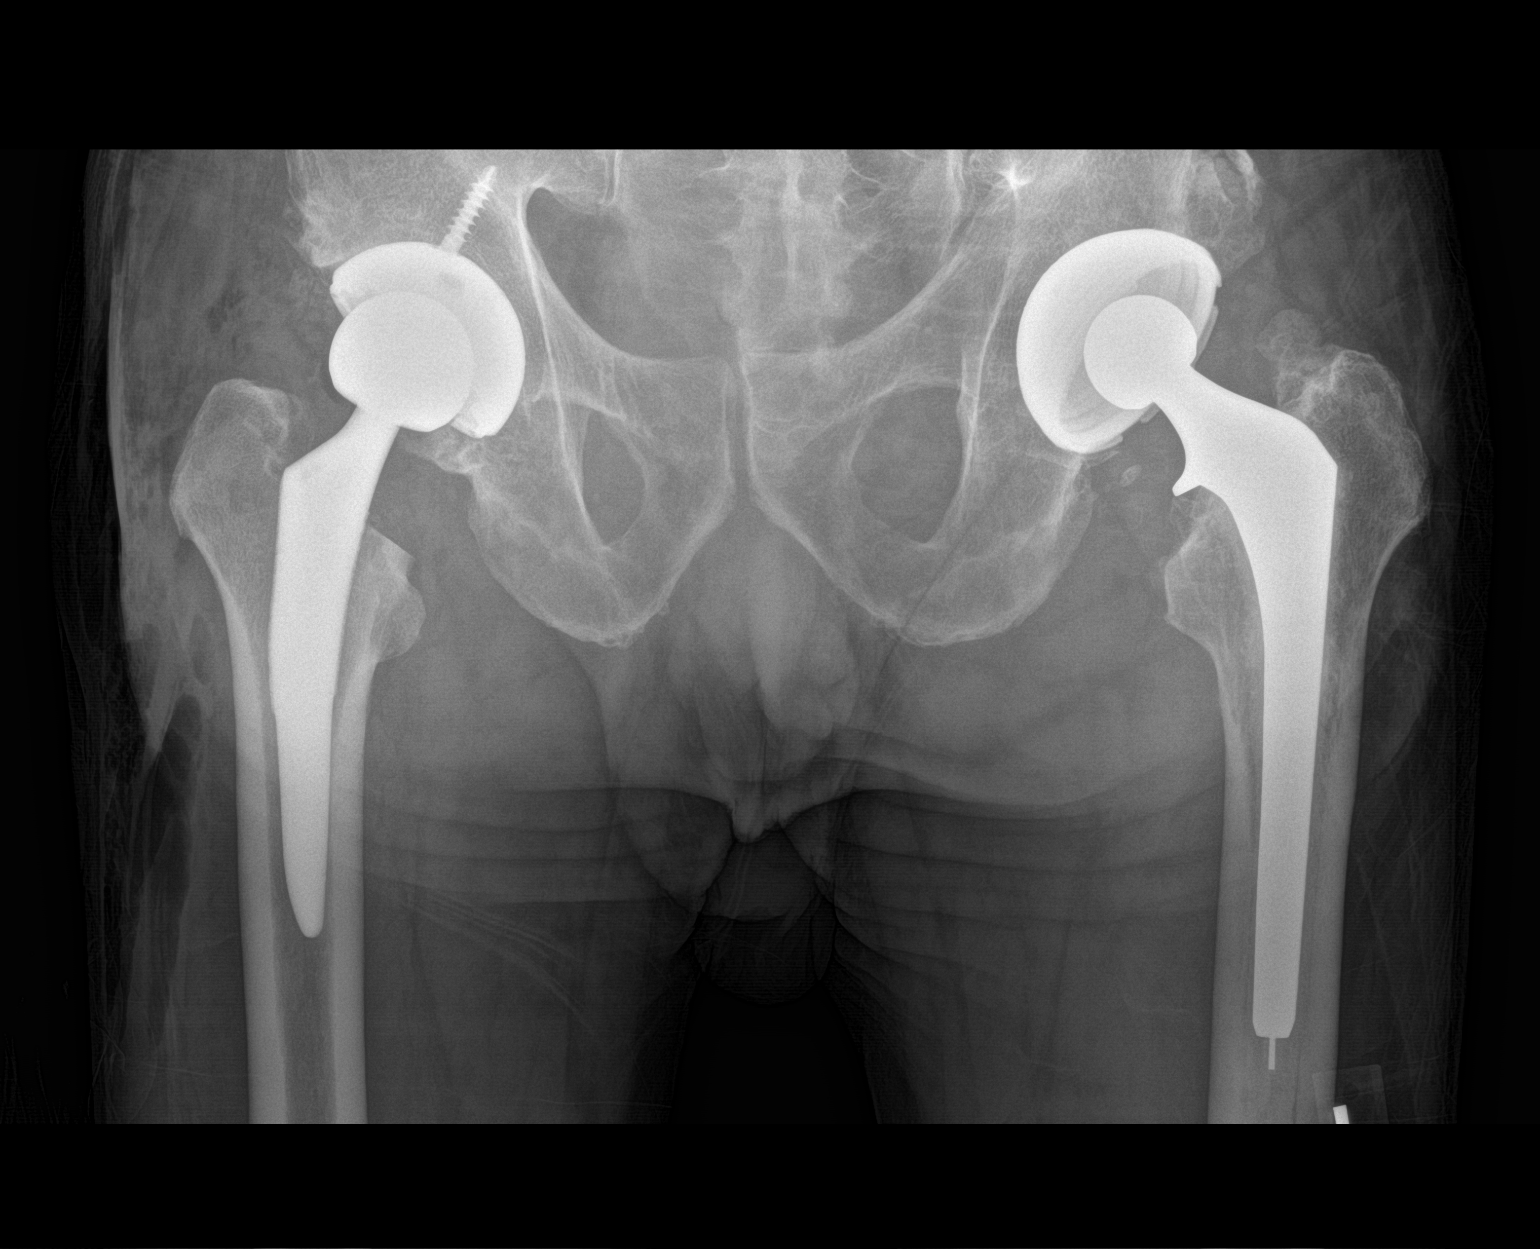

[hip frog leg (1 of 2)]
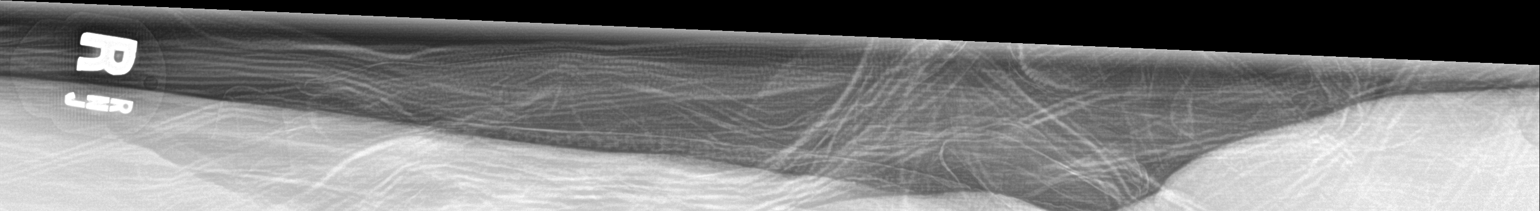

[hip frog leg (2 of 2)]
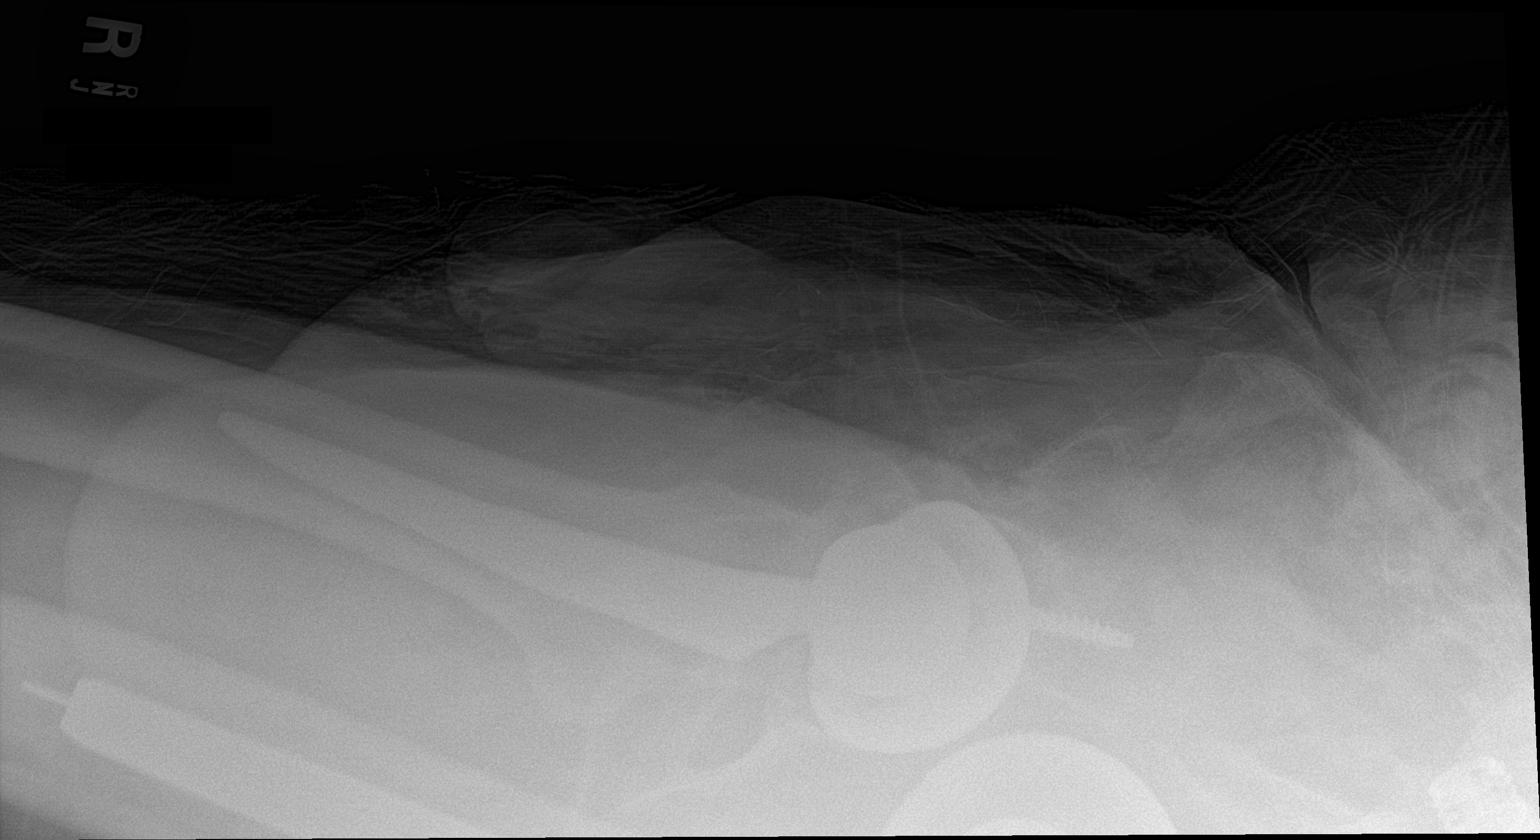

[3 of 3 positions shown; findings below may reference images not displayed]

FINDINGS: Two views of the right hip submitted. There is right hip prosthesis
with anatomic alignment. Postsurgical changes are noted with small
amount of periarticular soft tissue air. Left hip prosthesis with
anatomic alignment.
IMPRESSION: Right hip prosthesis with anatomic alignment. Postsurgical changes
are noted small amount of periarticular soft tissue air.

## 2016-08-11 ENCOUNTER — Other Ambulatory Visit (INDEPENDENT_AMBULATORY_CARE_PROVIDER_SITE_OTHER): Payer: Medicare Other

## 2016-08-11 DIAGNOSIS — R972 Elevated prostate specific antigen [PSA]: Secondary | ICD-10-CM | POA: Diagnosis not present

## 2016-08-11 LAB — PSA: PSA: 3.73 ng/mL (ref 0.10–4.00)

## 2016-08-21 DIAGNOSIS — H1132 Conjunctival hemorrhage, left eye: Secondary | ICD-10-CM | POA: Diagnosis not present

## 2016-08-23 DIAGNOSIS — Z23 Encounter for immunization: Secondary | ICD-10-CM | POA: Diagnosis not present

## 2016-09-05 DIAGNOSIS — H02831 Dermatochalasis of right upper eyelid: Secondary | ICD-10-CM | POA: Diagnosis not present

## 2016-09-05 DIAGNOSIS — H26493 Other secondary cataract, bilateral: Secondary | ICD-10-CM | POA: Diagnosis not present

## 2016-09-05 DIAGNOSIS — H02834 Dermatochalasis of left upper eyelid: Secondary | ICD-10-CM | POA: Diagnosis not present

## 2016-09-05 DIAGNOSIS — H524 Presbyopia: Secondary | ICD-10-CM | POA: Diagnosis not present

## 2016-09-05 DIAGNOSIS — H31091 Other chorioretinal scars, right eye: Secondary | ICD-10-CM | POA: Diagnosis not present

## 2016-10-17 DIAGNOSIS — H6002 Abscess of left external ear: Secondary | ICD-10-CM | POA: Diagnosis not present

## 2016-10-18 ENCOUNTER — Ambulatory Visit: Payer: Medicare Other | Admitting: Internal Medicine

## 2016-11-08 DIAGNOSIS — L821 Other seborrheic keratosis: Secondary | ICD-10-CM | POA: Diagnosis not present

## 2016-11-08 DIAGNOSIS — L812 Freckles: Secondary | ICD-10-CM | POA: Diagnosis not present

## 2016-11-08 DIAGNOSIS — L570 Actinic keratosis: Secondary | ICD-10-CM | POA: Diagnosis not present

## 2016-11-08 DIAGNOSIS — L720 Epidermal cyst: Secondary | ICD-10-CM | POA: Diagnosis not present

## 2016-11-08 DIAGNOSIS — D1801 Hemangioma of skin and subcutaneous tissue: Secondary | ICD-10-CM | POA: Diagnosis not present

## 2016-11-08 DIAGNOSIS — Z85820 Personal history of malignant melanoma of skin: Secondary | ICD-10-CM | POA: Diagnosis not present

## 2016-12-02 DIAGNOSIS — M1711 Unilateral primary osteoarthritis, right knee: Secondary | ICD-10-CM | POA: Diagnosis not present

## 2016-12-02 DIAGNOSIS — M25561 Pain in right knee: Secondary | ICD-10-CM | POA: Diagnosis not present

## 2016-12-02 DIAGNOSIS — G8929 Other chronic pain: Secondary | ICD-10-CM | POA: Diagnosis not present

## 2016-12-22 DIAGNOSIS — L720 Epidermal cyst: Secondary | ICD-10-CM | POA: Diagnosis not present

## 2017-03-23 DIAGNOSIS — Z471 Aftercare following joint replacement surgery: Secondary | ICD-10-CM | POA: Diagnosis not present

## 2017-03-23 DIAGNOSIS — Z96641 Presence of right artificial hip joint: Secondary | ICD-10-CM | POA: Diagnosis not present

## 2017-03-23 DIAGNOSIS — M1611 Unilateral primary osteoarthritis, right hip: Secondary | ICD-10-CM | POA: Diagnosis not present

## 2017-03-23 DIAGNOSIS — M1711 Unilateral primary osteoarthritis, right knee: Secondary | ICD-10-CM | POA: Diagnosis not present

## 2017-03-23 DIAGNOSIS — M5136 Other intervertebral disc degeneration, lumbar region: Secondary | ICD-10-CM | POA: Diagnosis not present

## 2017-05-01 DIAGNOSIS — L821 Other seborrheic keratosis: Secondary | ICD-10-CM | POA: Diagnosis not present

## 2017-05-01 DIAGNOSIS — L812 Freckles: Secondary | ICD-10-CM | POA: Diagnosis not present

## 2017-05-01 DIAGNOSIS — L570 Actinic keratosis: Secondary | ICD-10-CM | POA: Diagnosis not present

## 2017-05-01 DIAGNOSIS — L720 Epidermal cyst: Secondary | ICD-10-CM | POA: Diagnosis not present

## 2017-07-11 ENCOUNTER — Other Ambulatory Visit (INDEPENDENT_AMBULATORY_CARE_PROVIDER_SITE_OTHER): Payer: Medicare Other

## 2017-07-11 DIAGNOSIS — Z0001 Encounter for general adult medical examination with abnormal findings: Secondary | ICD-10-CM

## 2017-07-11 LAB — CBC WITH DIFFERENTIAL/PLATELET
BASOS ABS: 0 10*3/uL (ref 0.0–0.1)
Basophils Relative: 0.8 % (ref 0.0–3.0)
EOS ABS: 0.1 10*3/uL (ref 0.0–0.7)
Eosinophils Relative: 1.7 % (ref 0.0–5.0)
HCT: 45.2 % (ref 39.0–52.0)
Hemoglobin: 14.9 g/dL (ref 13.0–17.0)
LYMPHS ABS: 1.2 10*3/uL (ref 0.7–4.0)
Lymphocytes Relative: 23.7 % (ref 12.0–46.0)
MCHC: 33 g/dL (ref 30.0–36.0)
MCV: 94.5 fl (ref 78.0–100.0)
MONO ABS: 0.4 10*3/uL (ref 0.1–1.0)
MONOS PCT: 8.8 % (ref 3.0–12.0)
NEUTROS ABS: 3.2 10*3/uL (ref 1.4–7.7)
Neutrophils Relative %: 65 % (ref 43.0–77.0)
PLATELETS: 226 10*3/uL (ref 150.0–400.0)
RBC: 4.79 Mil/uL (ref 4.22–5.81)
RDW: 13.5 % (ref 11.5–15.5)
WBC: 4.9 10*3/uL (ref 4.0–10.5)

## 2017-07-11 LAB — URINALYSIS, ROUTINE W REFLEX MICROSCOPIC
Bilirubin Urine: NEGATIVE
Hgb urine dipstick: NEGATIVE
KETONES UR: NEGATIVE
Leukocytes, UA: NEGATIVE
Nitrite: NEGATIVE
PH: 5.5 (ref 5.0–8.0)
RBC / HPF: NONE SEEN (ref 0–?)
SPECIFIC GRAVITY, URINE: 1.025 (ref 1.000–1.030)
Total Protein, Urine: NEGATIVE
URINE GLUCOSE: NEGATIVE
Urobilinogen, UA: 0.2 (ref 0.0–1.0)
WBC, UA: NONE SEEN (ref 0–?)

## 2017-07-11 LAB — BASIC METABOLIC PANEL
BUN: 16 mg/dL (ref 6–23)
CALCIUM: 9.6 mg/dL (ref 8.4–10.5)
CHLORIDE: 106 meq/L (ref 96–112)
CO2: 29 meq/L (ref 19–32)
Creatinine, Ser: 0.89 mg/dL (ref 0.40–1.50)
GFR: 87.76 mL/min (ref >60.00)
GLUCOSE: 94 mg/dL (ref 70–99)
POTASSIUM: 4.8 meq/L (ref 3.5–5.1)
SODIUM: 142 meq/L (ref 135–145)

## 2017-07-11 LAB — HEPATIC FUNCTION PANEL
ALBUMIN: 4.3 g/dL (ref 3.5–5.2)
ALK PHOS: 51 U/L (ref 39–117)
ALT: 15 U/L (ref 0–53)
AST: 27 U/L (ref 0–37)
BILIRUBIN DIRECT: 0.1 mg/dL (ref 0.0–0.3)
TOTAL PROTEIN: 6.5 g/dL (ref 6.0–8.3)
Total Bilirubin: 0.6 mg/dL (ref 0.2–1.2)

## 2017-07-11 LAB — LIPID PANEL
CHOLESTEROL: 153 mg/dL (ref 0–200)
HDL: 49 mg/dL (ref >39.00)
LDL Cholesterol: 92 mg/dL (ref 0–99)
NONHDL: 103.67
Total CHOL/HDL Ratio: 3
Triglycerides: 59 mg/dL (ref 0.0–149.0)
VLDL: 11.8 mg/dL (ref 0.0–40.0)

## 2017-07-11 LAB — PSA: PSA: 1.96 ng/mL (ref 0.10–4.00)

## 2017-07-11 LAB — TSH: TSH: 2.82 u[IU]/mL (ref 0.35–4.50)

## 2017-07-13 ENCOUNTER — Encounter: Payer: Self-pay | Admitting: Internal Medicine

## 2017-07-13 ENCOUNTER — Ambulatory Visit (INDEPENDENT_AMBULATORY_CARE_PROVIDER_SITE_OTHER): Payer: Medicare Other | Admitting: Internal Medicine

## 2017-07-13 VITALS — BP 124/68 | HR 61 | Ht 69.0 in | Wt 170.0 lb

## 2017-07-13 DIAGNOSIS — Z0000 Encounter for general adult medical examination without abnormal findings: Secondary | ICD-10-CM | POA: Diagnosis not present

## 2017-07-13 NOTE — Assessment & Plan Note (Signed)

## 2017-07-13 NOTE — Progress Notes (Signed)
Subjective:    Patient ID: Alexander Castillo, male    DOB: [birth year], 76 y.o.   MRN: [id number]  HPI  Here for wellness and f/u;  Overall doing ok;  Pt denies Chest pain, worsening SOB, DOE, wheezing, orthopnea, PND, worsening LE edema, palpitations, dizziness or syncope.  Pt denies neurological change such as new headache, facial or extremity weakness.  Pt denies polydipsia, polyuria, or low sugar symptoms. Pt states overall good compliance with treatment and medications, good tolerability, and has been trying to follow appropriate diet.  Pt denies worsening depressive symptoms, suicidal ideation or panic. No fever, night sweats, wt loss, loss of appetite, or other constitutional symptoms.  Pt states good ability with ADL's, has low fall risk, home safety reviewed and adequate, no other significant changes in hearing or vision, and  occasionally active with exercise. Goes to the gym 3 times per wk.  No new complaints Past Medical History:  Diagnosis Date  . BENIGN PROSTATIC HYPERTROPHY 03/10/2008  . BENIGN PROSTATIC HYPERTROPHY, HX OF 03/10/2008  . CHEST PAIN 03/10/2008  . DEGENERATIVE JOINT DISEASE 03/10/2008  . EAR PAIN, LEFT 12/09/2008  . HYPERLIPIDEMIA 03/10/2008  . PSA, INCREASED 03/16/2009  . RASH-NONVESICULAR 03/16/2009   Past Surgical History:  Procedure Laterality Date  . back surgury  1998   lower  . EYE SURGERY Bilateral    lens replacement for cataracts  . hip replacement left Left 2002  . s/p left knee arthroscopy  yrs ago  . TOTAL HIP ARTHROPLASTY Right 06/13/2016   Procedure: RIGHT TOTAL HIP ARTHROPLASTY ANTERIOR APPROACH;  Surgeon: Paralee Cancel, MD;  Location: [facility];  Service: Orthopedics;  Laterality: Right;  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  . transurethral resection of prostate      reports that he has never smoked. He has never used smokeless tobacco. He reports that he does not drink alcohol or use drugs. family history includes Dementia in his mother. No Known Allergies Current  Outpatient Prescriptions on File Prior to Visit  Medication Sig Dispense Refill  . aspirin 81 MG tablet Take 81 mg by mouth daily.       No current facility-administered medications on file prior to visit.    Review of Systems Constitutional: Negative for other unusual diaphoresis, sweats, appetite or weight changes HENT: Negative for other worsening hearing loss, ear pain, facial swelling, mouth sores or neck stiffness.   Eyes: Negative for other worsening pain, redness or other visual disturbance.  Respiratory: Negative for other stridor or swelling Cardiovascular: Negative for other palpitations or other chest pain  Gastrointestinal: Negative for worsening diarrhea or loose stools, blood in stool, distention or other pain Genitourinary: Negative for hematuria, flank pain or other change in urine volume.  Musculoskeletal: Negative for myalgias or other joint swelling.  Skin: Negative for other color change, or other wound or worsening drainage.  Neurological: Negative for other syncope or numbness. Hematological: Negative for other adenopathy or swelling Psychiatric/Behavioral: Negative for hallucinations, other worsening agitation, SI, self-injury, or new decreased concentration All other system neg per pt    Objective:   Physical Exam BP 124/68   Pulse 61   Ht 5\' 9"  (1.753 m)   Wt 170 lb (77.1 kg)   SpO2 99%   BMI 25.10 kg/m  VS noted, normal wt Constitutional: Pt is oriented to person, place, and time. Appears well-developed and well-nourished, in no significant distress and comfortable Head: Normocephalic and atraumatic  Eyes: Conjunctivae and EOM are normal. Pupils are equal, round, and reactive to  light Right Ear: External ear normal without discharge Left Ear: External ear normal without discharge Nose: Nose without discharge or deformity Mouth/Throat: Oropharynx is without other ulcerations and moist  Neck: Normal range of motion. Neck supple. No JVD present. No  tracheal deviation present or significant neck LA or mass Cardiovascular: Normal rate, regular rhythm, normal heart sounds and intact distal pulses.   Pulmonary/Chest: WOB normal and breath sounds without rales or wheezing  Abdominal: Soft. Bowel sounds are normal. NT. No HSM  Musculoskeletal: Normal range of motion. Exhibits no edema Lymphadenopathy: Has no other cervical adenopathy.  Neurological: Pt is alert and oriented to person, place, and time. Pt has normal reflexes. No cranial nerve deficit. Motor grossly intact, Gait intact Skin: Skin is warm and dry. No rash noted or new ulcerations Psychiatric:  Has normal mood and affect. Behavior is normal without agitation No other exam findings Lab Results  Component Value Date   WBC 4.9 07/11/2017   HGB 14.9 07/11/2017   HCT 45.2 07/11/2017   PLT 226.0 07/11/2017   GLUCOSE 94 07/11/2017   CHOL 153 07/11/2017   TRIG 59.0 07/11/2017   HDL 49.00 07/11/2017   LDLCALC 92 07/11/2017   ALT 15 07/11/2017   AST 27 07/11/2017   NA 142 07/11/2017   K 4.8 07/11/2017   CL 106 07/11/2017   CREATININE 0.89 07/11/2017   BUN 16 07/11/2017   CO2 29 07/11/2017   TSH 2.82 07/11/2017   PSA 1.96 07/11/2017       Assessment & Plan:

## 2017-07-13 NOTE — Patient Instructions (Signed)
Please continue all other medications as before, and refills have been done if requested.  Please have the pharmacy call with any other refills you may need.  Please continue your efforts at being more active, low cholesterol diet, and weight control.  You are otherwise up to date with prevention measures today.  Please keep your appointments with your specialists as you may have planned  Please return in 1 year for your yearly visit, or sooner if needed, with Lab testing done 3-5 days before  

## 2017-08-22 ENCOUNTER — Telehealth: Payer: Self-pay

## 2017-09-06 DIAGNOSIS — H02834 Dermatochalasis of left upper eyelid: Secondary | ICD-10-CM | POA: Diagnosis not present

## 2017-09-06 DIAGNOSIS — H02831 Dermatochalasis of right upper eyelid: Secondary | ICD-10-CM | POA: Diagnosis not present

## 2017-09-06 DIAGNOSIS — H524 Presbyopia: Secondary | ICD-10-CM | POA: Diagnosis not present

## 2017-09-18 DIAGNOSIS — Z23 Encounter for immunization: Secondary | ICD-10-CM | POA: Diagnosis not present

## 2017-10-02 DIAGNOSIS — M1711 Unilateral primary osteoarthritis, right knee: Secondary | ICD-10-CM | POA: Diagnosis not present

## 2017-10-02 DIAGNOSIS — M25561 Pain in right knee: Secondary | ICD-10-CM | POA: Diagnosis not present

## 2017-10-02 DIAGNOSIS — G8929 Other chronic pain: Secondary | ICD-10-CM | POA: Diagnosis not present

## 2017-10-09 DIAGNOSIS — M1711 Unilateral primary osteoarthritis, right knee: Secondary | ICD-10-CM | POA: Diagnosis not present

## 2017-10-09 DIAGNOSIS — G8929 Other chronic pain: Secondary | ICD-10-CM | POA: Diagnosis not present

## 2017-10-09 DIAGNOSIS — M25561 Pain in right knee: Secondary | ICD-10-CM | POA: Diagnosis not present

## 2017-10-17 DIAGNOSIS — M1711 Unilateral primary osteoarthritis, right knee: Secondary | ICD-10-CM | POA: Diagnosis not present

## 2017-10-19 NOTE — Telephone Encounter (Signed)
error 

## 2017-11-06 DIAGNOSIS — L814 Other melanin hyperpigmentation: Secondary | ICD-10-CM | POA: Diagnosis not present

## 2017-11-06 DIAGNOSIS — L578 Other skin changes due to chronic exposure to nonionizing radiation: Secondary | ICD-10-CM | POA: Diagnosis not present

## 2017-11-06 DIAGNOSIS — L812 Freckles: Secondary | ICD-10-CM | POA: Diagnosis not present

## 2017-11-06 DIAGNOSIS — L570 Actinic keratosis: Secondary | ICD-10-CM | POA: Diagnosis not present

## 2017-11-06 DIAGNOSIS — L821 Other seborrheic keratosis: Secondary | ICD-10-CM | POA: Diagnosis not present

## 2018-01-02 DIAGNOSIS — M25561 Pain in right knee: Secondary | ICD-10-CM | POA: Insufficient documentation

## 2018-01-04 DIAGNOSIS — M1711 Unilateral primary osteoarthritis, right knee: Secondary | ICD-10-CM | POA: Diagnosis not present

## 2018-01-04 DIAGNOSIS — M25561 Pain in right knee: Secondary | ICD-10-CM | POA: Diagnosis not present

## 2018-01-11 ENCOUNTER — Encounter (HOSPITAL_COMMUNITY): Payer: Self-pay

## 2018-01-15 ENCOUNTER — Telehealth: Payer: Self-pay

## 2018-01-15 NOTE — Telephone Encounter (Signed)
Form has been printed from the media tab and re-faxed to [facility].  Copied from [place] [id number]. Topic: General - Other >> Jan 15, 2018 10:50 AM Boyd Kerbs wrote: Reason for CRM:   Ortho send questionnaire faxed to you on 1/24 and they have not heard anything back.  Pt. Is having hip replacement on the 12th of Feb.  The contact info for Arlan Organ is [employer] at [phone].

## 2018-01-16 ENCOUNTER — Other Ambulatory Visit (HOSPITAL_COMMUNITY): Payer: Self-pay | Admitting: Emergency Medicine

## 2018-01-16 NOTE — Progress Notes (Signed)
Surgical clearance Dr Cathlean Cower on chart

## 2018-01-16 NOTE — Patient Instructions (Signed)
Alexander Castillo  01/16/2018   Your procedure is scheduled on: 01-23-18   Report to [facility] Main  Entrance    Report to admitting at [facility]   Call this number if you have problems the morning of surgery [phone]     Remember: Do not eat food or drink liquids :After Midnight.     Take these medicines the morning of surgery with A SIP OF WATER: none                                You may not have any metal on your body including hair pins and              piercings  Do not wear jewelry, make-up, lotions, powders or perfumes, deodorant               Men may shave face and neck.   Do not bring valuables to the hospital. [place].  Contacts, dentures or bridgework may not be worn into surgery.  Leave suitcase in the car. After surgery it may be brought to your room.                Please read over the following fact sheets you were given: _____________________________________________________________________             [facility] - Preparing for Surgery Before surgery, you can play an important role.  Because skin is not sterile, your skin needs to be as free of germs as possible.  You can reduce the number of germs on your skin by washing with CHG (chlorahexidine gluconate) soap before surgery.  CHG is an antiseptic cleaner which kills germs and bonds with the skin to continue killing germs even after washing. Please DO NOT use if you have an allergy to CHG or antibacterial soaps.  If your skin becomes reddened/irritated stop using the CHG and inform your nurse when you arrive at Short Stay. Do not shave (including legs and underarms) for at least 48 hours prior to the first CHG shower.  You may shave your face/neck. Please follow these instructions carefully:  1.  Shower with CHG Soap the night before surgery and the  morning of Surgery.  2.  If you choose to wash your hair, wash your hair first as  usual with your  normal  shampoo.  3.  After you shampoo, rinse your hair and body thoroughly to remove the  shampoo.                           4.  Use CHG as you would any other liquid soap.  You can apply chg directly  to the skin and wash                       Gently with a scrungie or clean washcloth.  5.  Apply the CHG Soap to your body ONLY FROM THE NECK DOWN.   Do not use on face/ open                           Wound or open sores. Avoid contact with eyes, ears  mouth and genitals (private parts).                       Wash face,  Genitals (private parts) with your normal soap.             6.  Wash thoroughly, paying special attention to the area where your surgery  will be performed.  7.  Thoroughly rinse your body with warm water from the neck down.  8.  DO NOT shower/wash with your normal soap after using and rinsing off  the CHG Soap.                9.  Pat yourself dry with a clean towel.            10.  Wear clean pajamas.            11.  Place clean sheets on your bed the night of your first shower and do not  sleep with pets. Day of Surgery : Do not apply any lotions/deodorants the morning of surgery.  Please wear clean clothes to the hospital/surgery center.  FAILURE TO FOLLOW THESE INSTRUCTIONS MAY RESULT IN THE CANCELLATION OF YOUR SURGERY PATIENT SIGNATURE_________________________________  NURSE SIGNATURE__________________________________  ________________________________________________________________________   Alexander Castillo  An incentive spirometer is a tool that can help keep your lungs clear and active. This tool measures how well you are filling your lungs with each breath. Taking long deep breaths may help reverse or decrease the chance of developing breathing (pulmonary) problems (especially infection) following:  A long period of time when you are unable to move or be active. BEFORE THE PROCEDURE   If the spirometer includes an indicator to show your  best effort, your nurse or respiratory therapist will set it to a desired goal.  If possible, sit up straight or lean slightly forward. Try not to slouch.  Hold the incentive spirometer in an upright position. INSTRUCTIONS FOR USE  1. Sit on the edge of your bed if possible, or sit up as far as you can in bed or on a chair. 2. Hold the incentive spirometer in an upright position. 3. Breathe out normally. 4. Place the mouthpiece in your mouth and seal your lips tightly around it. 5. Breathe in slowly and as deeply as possible, raising the piston or the ball toward the top of the column. 6. Hold your breath for 3-5 seconds or for as long as possible. Allow the piston or ball to fall to the bottom of the column. 7. Remove the mouthpiece from your mouth and breathe out normally. 8. Rest for a few seconds and repeat Steps 1 through 7 at least 10 times every 1-2 hours when you are awake. Take your time and take a few normal breaths between deep breaths. 9. The spirometer may include an indicator to show your best effort. Use the indicator as a goal to work toward during each repetition. 10. After each set of 10 deep breaths, practice coughing to be sure your lungs are clear. If you have an incision (the cut made at the time of surgery), support your incision when coughing by placing a pillow or rolled up towels firmly against it. Once you are able to get out of bed, walk around indoors and cough well. You may stop using the incentive spirometer when instructed by your caregiver.  RISKS AND COMPLICATIONS  Take your time so you do not get dizzy or light-headed.  If you are in pain,  you may need to take or ask for pain medication before doing incentive spirometry. It is harder to take a deep breath if you are having pain. AFTER USE  Rest and breathe slowly and easily.  It can be helpful to keep track of a log of your progress. Your caregiver can provide you with a simple table to help with this. If  you are using the spirometer at home, follow these instructions: [place] IF:   You are having difficultly using the spirometer.  You have trouble using the spirometer as often as instructed.  Your pain medication is not giving enough relief while using the spirometer.  You develop fever of 100.5 F (38.1 C) or higher. SEEK IMMEDIATE MEDICAL CARE IF:   You cough up bloody sputum that had not been present before.  You develop fever of 102 F (38.9 C) or greater.  You develop worsening pain at or near the incision site. MAKE SURE YOU:   Understand these instructions.  Will watch your condition.  Will get help right away if you are not doing well or get worse. Document Released: 04/10/2007 Document Revised: 02/20/2012 Document Reviewed: 06/11/2007 ExitCare Patient Information 2014 ExitCare, [place].   ________________________________________________________________________  WHAT IS A BLOOD TRANSFUSION? Blood Transfusion Information  A transfusion is the replacement of blood or some of its parts. Blood is made up of multiple cells which provide different functions.  Red blood cells carry oxygen and are used for blood loss replacement.  White blood cells fight against infection.  Platelets control bleeding.  Plasma helps clot blood.  Other blood products are available for specialized needs, such as hemophilia or other clotting disorders. BEFORE THE TRANSFUSION  Who gives blood for transfusions?   Healthy volunteers who are fully evaluated to make sure their blood is safe. This is blood bank blood. Transfusion therapy is the safest it has ever been in the practice of medicine. Before blood is taken from a donor, a complete history is taken to make sure that person has no history of diseases nor engages in risky social behavior (examples are intravenous drug use or sexual activity with multiple partners). The donor's travel history is screened to minimize risk of  transmitting infections, such as malaria. The donated blood is tested for signs of infectious diseases, such as HIV and hepatitis. The blood is then tested to be sure it is compatible with you in order to minimize the chance of a transfusion reaction. If you or a relative donates blood, this is often done in anticipation of surgery and is not appropriate for emergency situations. It takes many days to process the donated blood. RISKS AND COMPLICATIONS Although transfusion therapy is very safe and saves many lives, the main dangers of transfusion include:   Getting an infectious disease.  Developing a transfusion reaction. This is an allergic reaction to something in the blood you were given. Every precaution is taken to prevent this. The decision to have a blood transfusion has been considered carefully by your caregiver before blood is given. Blood is not given unless the benefits outweigh the risks. AFTER THE TRANSFUSION  Right after receiving a blood transfusion, you will usually feel much better and more energetic. This is especially true if your red blood cells have gotten low (anemic). The transfusion raises the level of the red blood cells which carry oxygen, and this usually causes an energy increase.  The nurse administering the transfusion will monitor you carefully for complications. HOME CARE INSTRUCTIONS  No special instructions are needed after a transfusion. You may find your energy is better. Speak with your caregiver about any limitations on activity for underlying diseases you may have. SEEK MEDICAL CARE IF:   Your condition is not improving after your transfusion.  You develop redness or irritation at the intravenous (IV) site. SEEK IMMEDIATE MEDICAL CARE IF:  Any of the following symptoms occur over the next 12 hours:  Shaking chills.  You have a temperature by mouth above 102 F (38.9 C), not controlled by medicine.  Chest, back, or muscle pain.  People around you  feel you are not acting correctly or are confused.  Shortness of breath or difficulty breathing.  Dizziness and fainting.  You get a rash or develop hives.  You have a decrease in urine output.  Your urine turns a dark color or changes to pink, red, or brown. Any of the following symptoms occur over the next 10 days:  You have a temperature by mouth above 102 F (38.9 C), not controlled by medicine.  Shortness of breath.  Weakness after normal activity.  The white part of the eye turns yellow (jaundice).  You have a decrease in the amount of urine or are urinating less often.  Your urine turns a dark color or changes to pink, red, or brown. Document Released: 11/25/2000 Document Revised: 02/20/2012 Document Reviewed: 07/14/2008 [facility] Patient Information 2014 [place], [place].  _______________________________________________________________________

## 2018-01-17 ENCOUNTER — Encounter (HOSPITAL_COMMUNITY)
Admission: RE | Admit: 2018-01-17 | Discharge: 2018-01-17 | Disposition: A | Discharge status: Home / Self Care | Payer: Medicare Other | Source: Ambulatory Visit | Attending: Orthopedic Surgery | Admitting: Orthopedic Surgery

## 2018-01-17 ENCOUNTER — Encounter (HOSPITAL_COMMUNITY): Payer: Self-pay

## 2018-01-17 ENCOUNTER — Other Ambulatory Visit: Payer: Self-pay

## 2018-01-17 DIAGNOSIS — Z01812 Encounter for preprocedural laboratory examination: Secondary | ICD-10-CM | POA: Diagnosis not present

## 2018-01-17 DIAGNOSIS — M1711 Unilateral primary osteoarthritis, right knee: Secondary | ICD-10-CM | POA: Insufficient documentation

## 2018-01-17 LAB — CBC
HCT: 43 % (ref 39.0–52.0)
Hemoglobin: 14.4 g/dL (ref 13.0–17.0)
MCH: 31 pg (ref 26.0–34.0)
MCHC: 33.5 g/dL (ref 30.0–36.0)
MCV: 92.7 fL (ref 78.0–100.0)
Platelets: 236 10*3/uL (ref 150–400)
RBC: 4.64 MIL/uL (ref 4.22–5.81)
RDW: 13.4 % (ref 11.5–15.5)
WBC: 6.8 10*3/uL (ref 4.0–10.5)

## 2018-01-17 LAB — BASIC METABOLIC PANEL
ANION GAP: 9 (ref 5–15)
BUN: 15 mg/dL (ref 6–20)
CALCIUM: 9.2 mg/dL (ref 8.9–10.3)
CO2: 24 mmol/L (ref 22–32)
CREATININE: 0.82 mg/dL (ref 0.61–1.24)
Chloride: 108 mmol/L (ref 101–111)
GFR calc Af Amer: >60 mL/min (ref >60)
GLUCOSE: 92 mg/dL (ref 65–99)
Potassium: 4.1 mmol/L (ref 3.5–5.1)
Sodium: 141 mmol/L (ref 135–145)

## 2018-01-18 LAB — SURGICAL PCR SCREEN
MRSA, PCR: POSITIVE — AB
Staphylococcus aureus: POSITIVE — AB

## 2018-01-19 NOTE — H&P (Signed)
TOTAL KNEE ADMISSION H&P  Patient is being admitted for right total knee arthroplasty.  Subjective:  Chief Complaint:  Right knee primary OA / pain  HPI: Alexander Castillo, 77 y.o. male, has a history of pain and functional disability in the right knee due to arthritis and has failed non-surgical conservative treatments for greater than 12 weeks to include NSAID's and/or analgesics, corticosteriod injections, viscosupplementation injections and activity modification.  Onset of symptoms was gradual, starting 1-2 years ago with gradually worsening course since that time. The patient noted no past surgery on the right knee(s).  Patient currently rates pain in the right knee(s) at 9 out of 10 with activity. Patient has worsening of pain with activity and weight bearing, pain that interferes with activities of daily living, pain with passive range of motion, crepitus and joint swelling.  Patient has evidence of periarticular osteophytes and joint space narrowing by imaging studies.  There is no active infection.  Risks, benefits and expectations were discussed with the patient.  Risks including but not limited to the risk of anesthesia, blood clots, nerve damage, blood vessel damage, failure of the prosthesis, infection and up to and including death.  Patient understand the risks, benefits and expectations and wishes to proceed with surgery.   PCP: Biagio Borg, MD  D/C Plans:       Home   Post-op Meds:       No Rx given  Tranexamic Acid:      To be given - IV   Decadron:      Is to be given  FYI:      ASA  [place]  DME:   Pt already has equipment  PT:   OPPT Rx given   Patient Active Problem List   Diagnosis Date Noted  . Status post total replacement of right hip 06/13/2016  . BPPV (benign paroxysmal positional vertigo) 02/23/2015  . Preventative health care 06/14/2011  . Depression 06/14/2011  . PSA, INCREASED 03/16/2009  . Hyperlipidemia 03/10/2008  . BENIGN PROSTATIC HYPERTROPHY  03/10/2008  . DEGENERATIVE JOINT DISEASE 03/10/2008   Past Medical History:  Diagnosis Date  . BENIGN PROSTATIC HYPERTROPHY 03/10/2008  . BENIGN PROSTATIC HYPERTROPHY, HX OF 03/10/2008  . CHEST PAIN 03/10/2008   at pre-op 01-17-18: per patient had cardiac eval at that time was sent for test (uaware type) ; reports nothing was abnormal; no recurrence of chest pain   . DEGENERATIVE JOINT DISEASE 03/10/2008  . EAR PAIN, LEFT 12/09/2008  . HYPERLIPIDEMIA 03/10/2008  . PSA, INCREASED 03/16/2009  . RASH-NONVESICULAR 03/16/2009    Past Surgical History:  Procedure Laterality Date  . back surgury  1998   lower  . EYE SURGERY Bilateral    lens replacement for cataracts  . hip replacement left Left 2002  . s/p left knee arthroscopy  yrs ago  . TOTAL HIP ARTHROPLASTY Right 06/13/2016   Procedure: RIGHT TOTAL HIP ARTHROPLASTY ANTERIOR APPROACH;  Surgeon: Paralee Cancel, MD;  Location: [facility];  Service: Orthopedics;  Laterality: Right;  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  . transurethral resection of prostate      No current facility-administered medications for this encounter.    Current Outpatient Medications  Medication Sig Dispense Refill Last Dose  . aspirin 81 MG tablet Take 81 mg by mouth daily.     Taking  . ibuprofen (ADVIL,MOTRIN) 200 MG tablet Take 400 mg by mouth daily as needed for headache or moderate pain.      No Known Allergies  Social History  Tobacco Use  . Smoking status: Never Smoker  . Smokeless tobacco: Former Network engineer Use Topics  . Alcohol use: No    Family History  Problem Relation Age of Onset  . Dementia Mother      Review of Systems  Constitutional: Negative.   HENT: Negative.   Eyes: Negative.   Respiratory: Negative.   Cardiovascular: Negative.   Gastrointestinal: Negative.   Genitourinary: Negative.   Musculoskeletal: Positive for joint pain.  Skin: Negative.   Neurological: Negative.   Endo/Heme/Allergies: Negative.   Psychiatric/Behavioral: Positive for  depression.    Objective:  Physical Exam  Constitutional: He is oriented to person, place, and time. He appears well-developed.  HENT:  Head: Normocephalic.  Eyes: Pupils are equal, round, and reactive to light.  Neck: Neck supple. No JVD present. No tracheal deviation present. No thyromegaly present.  Cardiovascular: Normal rate, regular rhythm and intact distal pulses.  Respiratory: Effort normal and breath sounds normal. No respiratory distress. He has no wheezes.  GI: Soft. There is no tenderness. There is no guarding.  Musculoskeletal:       Right knee: He exhibits decreased range of motion, swelling and bony tenderness. He exhibits no ecchymosis, no deformity, no laceration and no erythema. Tenderness found.  Lymphadenopathy:    He has no cervical adenopathy.  Neurological: He is alert and oriented to person, place, and time.  Skin: Skin is warm and dry.  Psychiatric: He has a normal mood and affect.     Labs:  Estimated body mass index is 25.99 kg/m as calculated from the following:   Height as of 01/17/18: 5\' 9"  (1.753 m).   Weight as of 01/17/18: 79.8 kg (176 lb).   Imaging Review Plain radiographs demonstrate severe degenerative joint disease of the right knee(s). The overall alignment is genu varus. The bone quality appears to be good for age and reported activity level.  Assessment/Plan:  End stage arthritis, right knee   The patient history, physical examination, clinical judgment of the provider and imaging studies are consistent with end stage degenerative joint disease of the right knee(s) and total knee arthroplasty is deemed medically necessary. The treatment options including medical management, injection therapy arthroscopy and arthroplasty were discussed at length. The risks and benefits of total knee arthroplasty were presented and reviewed. The risks due to aseptic loosening, infection, stiffness, patella tracking problems, thromboembolic complications and  other imponderables were discussed. The patient acknowledged the explanation, agreed to proceed with the plan and consent was signed. Patient is being admitted for inpatient treatment for surgery, pain control, PT, OT, prophylactic antibiotics, VTE prophylaxis, progressive ambulation and ADL's and discharge planning. The patient is planning to be discharged home.     West Pugh Syrai Gladwin   PA-C  01/19/2018, 10:40 AM

## 2018-01-22 MED ORDER — TRANEXAMIC ACID 1000 MG/10ML IV SOLN
1000.0000 mg | INTRAVENOUS | Status: AC
  Administered 2018-01-23: 1000 mg via INTRAVENOUS
  Filled 2018-01-22: qty 1100

## 2018-01-23 ENCOUNTER — Observation Stay (HOSPITAL_COMMUNITY)
Admission: RE | Admit: 2018-01-23 | Discharge: 2018-01-24 | Disposition: A | Discharge status: Home / Self Care | Payer: Medicare Other | Source: Ambulatory Visit | Attending: Orthopedic Surgery | Admitting: Orthopedic Surgery

## 2018-01-23 ENCOUNTER — Inpatient Hospital Stay (HOSPITAL_COMMUNITY): Payer: Medicare Other | Admitting: Anesthesiology

## 2018-01-23 ENCOUNTER — Other Ambulatory Visit: Payer: Self-pay

## 2018-01-23 ENCOUNTER — Encounter (HOSPITAL_COMMUNITY): Payer: Self-pay

## 2018-01-23 ENCOUNTER — Encounter (HOSPITAL_COMMUNITY)
Admission: RE | Disposition: A | Discharge status: Home / Self Care | Payer: Self-pay | Source: Ambulatory Visit | Attending: Orthopedic Surgery

## 2018-01-23 DIAGNOSIS — M25761 Osteophyte, right knee: Secondary | ICD-10-CM | POA: Diagnosis not present

## 2018-01-23 DIAGNOSIS — M65861 Other synovitis and tenosynovitis, right lower leg: Secondary | ICD-10-CM | POA: Insufficient documentation

## 2018-01-23 DIAGNOSIS — M1711 Unilateral primary osteoarthritis, right knee: Principal | ICD-10-CM | POA: Insufficient documentation

## 2018-01-23 DIAGNOSIS — Z6825 Body mass index (BMI) 25.0-25.9, adult: Secondary | ICD-10-CM | POA: Insufficient documentation

## 2018-01-23 DIAGNOSIS — Z87891 Personal history of nicotine dependence: Secondary | ICD-10-CM | POA: Insufficient documentation

## 2018-01-23 DIAGNOSIS — E785 Hyperlipidemia, unspecified: Secondary | ICD-10-CM | POA: Diagnosis not present

## 2018-01-23 DIAGNOSIS — E663 Overweight: Secondary | ICD-10-CM | POA: Diagnosis present

## 2018-01-23 DIAGNOSIS — Z7982 Long term (current) use of aspirin: Secondary | ICD-10-CM | POA: Insufficient documentation

## 2018-01-23 DIAGNOSIS — G8918 Other acute postprocedural pain: Secondary | ICD-10-CM | POA: Diagnosis not present

## 2018-01-23 DIAGNOSIS — Z96651 Presence of right artificial knee joint: Secondary | ICD-10-CM

## 2018-01-23 DIAGNOSIS — Z96659 Presence of unspecified artificial knee joint: Secondary | ICD-10-CM

## 2018-01-23 HISTORY — PX: TOTAL KNEE ARTHROPLASTY: SHX125

## 2018-01-23 LAB — TYPE AND SCREEN
ABO/RH(D): AB POS
Antibody Screen: NEGATIVE

## 2018-01-23 SURGERY — ARTHROPLASTY, KNEE, TOTAL
Anesthesia: Spinal | Site: Knee | Laterality: Right

## 2018-01-23 MED ORDER — ROCURONIUM BROMIDE 10 MG/ML (PF) SYRINGE
PREFILLED_SYRINGE | INTRAVENOUS | Status: AC
  Filled 2018-01-23: qty 5

## 2018-01-23 MED ORDER — DOCUSATE SODIUM 100 MG PO CAPS
100.0000 mg | ORAL_CAPSULE | Freq: Two times a day (BID) | ORAL | Status: DC
  Administered 2018-01-23 – 2018-01-24 (×2): 100 mg via ORAL
  Filled 2018-01-23 (×2): qty 1

## 2018-01-23 MED ORDER — SODIUM CHLORIDE 0.9 % IR SOLN
Status: DC | PRN
  Administered 2018-01-23: 1000 mL

## 2018-01-23 MED ORDER — ACETAMINOPHEN 325 MG PO TABS: 650.0000 mg | ORAL_TABLET | ORAL | Status: DC | PRN

## 2018-01-23 MED ORDER — METHOCARBAMOL 1000 MG/10ML IJ SOLN
500.0000 mg | Freq: Four times a day (QID) | INTRAMUSCULAR | Status: DC | PRN
  Administered 2018-01-23: 500 mg via INTRAVENOUS
  Filled 2018-01-23: qty 550

## 2018-01-23 MED ORDER — VANCOMYCIN HCL IN DEXTROSE 1-5 GM/200ML-% IV SOLN
1000.0000 mg | Freq: Once | INTRAVENOUS | Status: AC
  Administered 2018-01-23: 1000 mg via INTRAVENOUS
  Filled 2018-01-23: qty 200

## 2018-01-23 MED ORDER — MENTHOL 3 MG MT LOZG: 1.0000 | LOZENGE | OROMUCOSAL | Status: DC | PRN

## 2018-01-23 MED ORDER — STERILE WATER FOR IRRIGATION IR SOLN
Status: DC | PRN
  Administered 2018-01-23: 2000 mL

## 2018-01-23 MED ORDER — LIDOCAINE 2% (20 MG/ML) 5 ML SYRINGE
INTRAMUSCULAR | Status: AC
  Filled 2018-01-23: qty 5

## 2018-01-23 MED ORDER — FERROUS SULFATE 325 (65 FE) MG PO TABS
325.0000 mg | ORAL_TABLET | Freq: Three times a day (TID) | ORAL | 3 refills | Status: DC
Start: 2018-01-23 — End: 2018-07-16

## 2018-01-23 MED ORDER — MAGNESIUM CITRATE PO SOLN: 1.0000 | Freq: Once | ORAL | Status: DC | PRN

## 2018-01-23 MED ORDER — ALUM & MAG HYDROXIDE-SIMETH 200-200-20 MG/5ML PO SUSP: 15.0000 mL | ORAL | Status: DC | PRN

## 2018-01-23 MED ORDER — SODIUM CHLORIDE 0.9 % IJ SOLN
INTRAMUSCULAR | Status: AC
  Filled 2018-01-23: qty 50

## 2018-01-23 MED ORDER — TRANEXAMIC ACID 1000 MG/10ML IV SOLN
1000.0000 mg | Freq: Once | INTRAVENOUS | Status: AC
  Administered 2018-01-23: 1000 mg via INTRAVENOUS
  Filled 2018-01-23: qty 1100

## 2018-01-23 MED ORDER — CEFAZOLIN SODIUM-DEXTROSE 2-4 GM/100ML-% IV SOLN
2.0000 g | Freq: Four times a day (QID) | INTRAVENOUS | Status: AC
  Administered 2018-01-23 (×2): 2 g via INTRAVENOUS
  Filled 2018-01-23 (×2): qty 100

## 2018-01-23 MED ORDER — ROPIVACAINE HCL 7.5 MG/ML IJ SOLN
INTRAMUSCULAR | Status: DC | PRN
  Administered 2018-01-23: 20 mL via PERINEURAL

## 2018-01-23 MED ORDER — BUPIVACAINE-EPINEPHRINE (PF) 0.25% -1:200000 IJ SOLN
INTRAMUSCULAR | Status: DC | PRN
  Administered 2018-01-23: 30 mL

## 2018-01-23 MED ORDER — PROMETHAZINE HCL 25 MG/ML IJ SOLN: 6.2500 mg | INTRAMUSCULAR | Status: DC | PRN

## 2018-01-23 MED ORDER — CHLORHEXIDINE GLUCONATE 4 % EX LIQD: 60.0000 mL | Freq: Once | CUTANEOUS | Status: DC

## 2018-01-23 MED ORDER — ROCURONIUM 10MG/ML (10ML) SYRINGE FOR MEDFUSION PUMP - OPTIME
INTRAVENOUS | Status: DC | PRN
  Administered 2018-01-23: 10 mg via INTRAVENOUS
  Administered 2018-01-23: 50 mg via INTRAVENOUS

## 2018-01-23 MED ORDER — ACETAMINOPHEN 650 MG RE SUPP: 650.0000 mg | RECTAL | Status: DC | PRN

## 2018-01-23 MED ORDER — HYDROCODONE-ACETAMINOPHEN 7.5-325 MG PO TABS: 1.0000 | ORAL_TABLET | ORAL | 0 refills | Status: DC | PRN

## 2018-01-23 MED ORDER — METOCLOPRAMIDE HCL 5 MG PO TABS: 5.0000 mg | ORAL_TABLET | Freq: Three times a day (TID) | ORAL | Status: DC | PRN

## 2018-01-23 MED ORDER — FENTANYL CITRATE (PF) 100 MCG/2ML IJ SOLN
INTRAMUSCULAR | Status: DC | PRN
  Administered 2018-01-23 (×2): 50 ug via INTRAVENOUS

## 2018-01-23 MED ORDER — MEPERIDINE HCL 50 MG/ML IJ SOLN: 6.2500 mg | INTRAMUSCULAR | Status: DC | PRN

## 2018-01-23 MED ORDER — HYDROMORPHONE HCL 1 MG/ML IJ SOLN: 0.5000 mg | INTRAMUSCULAR | Status: DC | PRN

## 2018-01-23 MED ORDER — DIPHENHYDRAMINE HCL 12.5 MG/5ML PO ELIX: 12.5000 mg | ORAL_SOLUTION | ORAL | Status: DC | PRN

## 2018-01-23 MED ORDER — METOCLOPRAMIDE HCL 5 MG/ML IJ SOLN: 5.0000 mg | Freq: Three times a day (TID) | INTRAMUSCULAR | Status: DC | PRN

## 2018-01-23 MED ORDER — PROPOFOL 10 MG/ML IV BOLUS
INTRAVENOUS | Status: DC | PRN
  Administered 2018-01-23: 150 mg via INTRAVENOUS

## 2018-01-23 MED ORDER — ONDANSETRON HCL 4 MG/2ML IJ SOLN: 4.0000 mg | Freq: Three times a day (TID) | INTRAMUSCULAR | Status: DC | PRN

## 2018-01-23 MED ORDER — FENTANYL CITRATE (PF) 100 MCG/2ML IJ SOLN
50.0000 ug | INTRAMUSCULAR | Status: DC
  Administered 2018-01-23: 50 ug via INTRAVENOUS
  Filled 2018-01-23: qty 2

## 2018-01-23 MED ORDER — POLYETHYLENE GLYCOL 3350 17 G PO PACK: 17.0000 g | PACK | Freq: Two times a day (BID) | ORAL | 0 refills | Status: DC

## 2018-01-23 MED ORDER — KETOROLAC TROMETHAMINE 30 MG/ML IJ SOLN
INTRAMUSCULAR | Status: DC | PRN
  Administered 2018-01-23: 30 mg

## 2018-01-23 MED ORDER — ONDANSETRON HCL 4 MG/2ML IJ SOLN
INTRAMUSCULAR | Status: DC | PRN
  Administered 2018-01-23: 4 mg via INTRAVENOUS

## 2018-01-23 MED ORDER — DEXAMETHASONE SODIUM PHOSPHATE 10 MG/ML IJ SOLN
10.0000 mg | Freq: Once | INTRAMUSCULAR | Status: DC
  Filled 2018-01-23: qty 1

## 2018-01-23 MED ORDER — FENTANYL CITRATE (PF) 100 MCG/2ML IJ SOLN
INTRAMUSCULAR | Status: AC
  Filled 2018-01-23: qty 2

## 2018-01-23 MED ORDER — HYDROCODONE-ACETAMINOPHEN 7.5-325 MG PO TABS: 2.0000 | ORAL_TABLET | ORAL | Status: DC | PRN

## 2018-01-23 MED ORDER — MIDAZOLAM HCL 2 MG/2ML IJ SOLN: 0.5000 mg | Freq: Once | INTRAMUSCULAR | Status: DC | PRN

## 2018-01-23 MED ORDER — ASPIRIN 81 MG PO CHEW: 81.0000 mg | CHEWABLE_TABLET | Freq: Two times a day (BID) | ORAL | 0 refills | Status: AC

## 2018-01-23 MED ORDER — SUGAMMADEX SODIUM 200 MG/2ML IV SOLN
INTRAVENOUS | Status: AC
  Filled 2018-01-23: qty 2

## 2018-01-23 MED ORDER — ASPIRIN 81 MG PO CHEW
81.0000 mg | CHEWABLE_TABLET | Freq: Two times a day (BID) | ORAL | Status: DC
  Administered 2018-01-23 – 2018-01-24 (×2): 81 mg via ORAL
  Filled 2018-01-23 (×2): qty 1

## 2018-01-23 MED ORDER — MIDAZOLAM HCL 2 MG/2ML IJ SOLN
1.0000 mg | INTRAMUSCULAR | Status: DC
  Administered 2018-01-23: 1 mg via INTRAVENOUS
  Filled 2018-01-23: qty 2

## 2018-01-23 MED ORDER — ONDANSETRON HCL 4 MG/2ML IJ SOLN
INTRAMUSCULAR | Status: AC
  Filled 2018-01-23: qty 2

## 2018-01-23 MED ORDER — CEFAZOLIN SODIUM-DEXTROSE 2-4 GM/100ML-% IV SOLN
2.0000 g | INTRAVENOUS | Status: AC
  Administered 2018-01-23: 2 g via INTRAVENOUS
  Filled 2018-01-23: qty 100

## 2018-01-23 MED ORDER — METHOCARBAMOL 500 MG PO TABS
500.0000 mg | ORAL_TABLET | Freq: Four times a day (QID) | ORAL | Status: DC | PRN
  Administered 2018-01-23 – 2018-01-24 (×3): 500 mg via ORAL
  Filled 2018-01-23 (×3): qty 1

## 2018-01-23 MED ORDER — 0.9 % SODIUM CHLORIDE (POUR BTL) OPTIME
TOPICAL | Status: DC | PRN
  Administered 2018-01-23: 1000 mL

## 2018-01-23 MED ORDER — METHOCARBAMOL 500 MG PO TABS: 500.0000 mg | ORAL_TABLET | Freq: Four times a day (QID) | ORAL | 0 refills | Status: DC | PRN

## 2018-01-23 MED ORDER — LACTATED RINGERS IV SOLN
INTRAVENOUS | Status: DC
  Administered 2018-01-23 (×2): via INTRAVENOUS

## 2018-01-23 MED ORDER — BUPIVACAINE-EPINEPHRINE 0.25% -1:200000 IJ SOLN
INTRAMUSCULAR | Status: AC
  Filled 2018-01-23: qty 1

## 2018-01-23 MED ORDER — HYDROCODONE-ACETAMINOPHEN 7.5-325 MG PO TABS
1.0000 | ORAL_TABLET | ORAL | Status: DC | PRN
  Administered 2018-01-23 – 2018-01-24 (×5): 1 via ORAL
  Filled 2018-01-23 (×5): qty 1

## 2018-01-23 MED ORDER — SODIUM CHLORIDE 0.9 % IV SOLN: INTRAVENOUS | Status: DC

## 2018-01-23 MED ORDER — KETOROLAC TROMETHAMINE 30 MG/ML IJ SOLN
INTRAMUSCULAR | Status: AC
  Filled 2018-01-23: qty 1

## 2018-01-23 MED ORDER — ONDANSETRON HCL 4 MG PO TABS: 4.0000 mg | ORAL_TABLET | Freq: Three times a day (TID) | ORAL | Status: DC | PRN

## 2018-01-23 MED ORDER — CELECOXIB 200 MG PO CAPS: 200.0000 mg | ORAL_CAPSULE | Freq: Two times a day (BID) | ORAL | 0 refills | Status: DC

## 2018-01-23 MED ORDER — FERROUS SULFATE 325 (65 FE) MG PO TABS
325.0000 mg | ORAL_TABLET | Freq: Three times a day (TID) | ORAL | Status: DC
  Administered 2018-01-24: 325 mg via ORAL
  Filled 2018-01-23: qty 1

## 2018-01-23 MED ORDER — DEXAMETHASONE SODIUM PHOSPHATE 10 MG/ML IJ SOLN
INTRAMUSCULAR | Status: AC
  Filled 2018-01-23: qty 1

## 2018-01-23 MED ORDER — PHENYLEPHRINE 40 MCG/ML (10ML) SYRINGE FOR IV PUSH (FOR BLOOD PRESSURE SUPPORT)
PREFILLED_SYRINGE | INTRAVENOUS | Status: DC | PRN
  Administered 2018-01-23: 80 ug via INTRAVENOUS

## 2018-01-23 MED ORDER — FENTANYL CITRATE (PF) 100 MCG/2ML IJ SOLN
25.0000 ug | INTRAMUSCULAR | Status: DC | PRN
  Administered 2018-01-23 (×3): 50 ug via INTRAVENOUS

## 2018-01-23 MED ORDER — BISACODYL 10 MG RE SUPP: 10.0000 mg | Freq: Every day | RECTAL | Status: DC | PRN

## 2018-01-23 MED ORDER — PHENOL 1.4 % MT LIQD
1.0000 | OROMUCOSAL | Status: DC | PRN
  Filled 2018-01-23: qty 177

## 2018-01-23 MED ORDER — LIDOCAINE HCL (CARDIAC) 20 MG/ML IV SOLN
INTRAVENOUS | Status: DC | PRN
  Administered 2018-01-23: 100 mg via INTRATRACHEAL

## 2018-01-23 MED ORDER — DEXAMETHASONE SODIUM PHOSPHATE 10 MG/ML IJ SOLN
10.0000 mg | Freq: Once | INTRAMUSCULAR | Status: AC
  Administered 2018-01-23: 10 mg via INTRAVENOUS

## 2018-01-23 MED ORDER — CELECOXIB 200 MG PO CAPS
200.0000 mg | ORAL_CAPSULE | Freq: Two times a day (BID) | ORAL | Status: DC
  Administered 2018-01-23 – 2018-01-24 (×2): 200 mg via ORAL
  Filled 2018-01-23 (×2): qty 1

## 2018-01-23 MED ORDER — PROPOFOL 10 MG/ML IV BOLUS
INTRAVENOUS | Status: AC
  Filled 2018-01-23: qty 40

## 2018-01-23 MED ORDER — SODIUM CHLORIDE 0.9 % IJ SOLN
INTRAMUSCULAR | Status: DC | PRN
  Administered 2018-01-23: 30 mL

## 2018-01-23 MED ORDER — POLYETHYLENE GLYCOL 3350 17 G PO PACK
17.0000 g | PACK | Freq: Two times a day (BID) | ORAL | Status: DC
  Administered 2018-01-23 – 2018-01-24 (×2): 17 g via ORAL
  Filled 2018-01-23 (×2): qty 1

## 2018-01-23 MED ORDER — SUGAMMADEX SODIUM 200 MG/2ML IV SOLN
INTRAVENOUS | Status: DC | PRN
  Administered 2018-01-23: 200 mg via INTRAVENOUS

## 2018-01-23 MED ORDER — DOCUSATE SODIUM 100 MG PO CAPS: 100.0000 mg | ORAL_CAPSULE | Freq: Two times a day (BID) | ORAL | 0 refills | Status: DC

## 2018-01-23 SURGICAL SUPPLY — 48 items
ADH SKN CLS APL DERMABOND .7 (GAUZE/BANDAGES/DRESSINGS) ×1
BAG DECANTER FOR FLEXI CONT (MISCELLANEOUS) IMPLANT
BAG SPEC THK2 15X12 ZIP CLS (MISCELLANEOUS) ×1
BAG ZIPLOCK 12X15 (MISCELLANEOUS) ×2 IMPLANT
BANDAGE ACE 6X5 VEL STRL LF (GAUZE/BANDAGES/DRESSINGS) ×3 IMPLANT
BLADE SAW SGTL 11.0X1.19X90.0M (BLADE) IMPLANT
BLADE SAW SGTL 13.0X1.19X90.0M (BLADE) ×3 IMPLANT
BOWL SMART MIX CTS (DISPOSABLE) ×3 IMPLANT
CAPT KNEE TOTAL 3 ATTUNE ×2 IMPLANT
CEMENT HV SMART SET (Cement) ×4 IMPLANT
COVER SURGICAL LIGHT HANDLE (MISCELLANEOUS) ×3 IMPLANT
CUFF TOURN SGL QUICK 34 (TOURNIQUET CUFF) ×3
CUFF TRNQT CYL 34X4X40X1 (TOURNIQUET CUFF) ×1 IMPLANT
DECANTER SPIKE VIAL GLASS SM (MISCELLANEOUS) ×3 IMPLANT
DERMABOND ADVANCED (GAUZE/BANDAGES/DRESSINGS) ×2
DERMABOND ADVANCED .7 DNX12 (GAUZE/BANDAGES/DRESSINGS) ×1 IMPLANT
DRAPE U-SHAPE 47X51 STRL (DRAPES) ×3 IMPLANT
DRESSING AQUACEL AG SP 3.5X10 (GAUZE/BANDAGES/DRESSINGS) ×1 IMPLANT
DRSG AQUACEL AG SP 3.5X10 (GAUZE/BANDAGES/DRESSINGS) ×3
DURAPREP 26ML APPLICATOR (WOUND CARE) ×6 IMPLANT
ELECT REM PT RETURN 15FT ADLT (MISCELLANEOUS) ×3 IMPLANT
GLOVE BIOGEL M 7.0 STRL (GLOVE) ×4 IMPLANT
GLOVE BIOGEL PI IND STRL 7.5 (GLOVE) ×1 IMPLANT
GLOVE BIOGEL PI IND STRL 8.5 (GLOVE) ×1 IMPLANT
GLOVE BIOGEL PI INDICATOR 7.5 (GLOVE) ×4
GLOVE BIOGEL PI INDICATOR 8.5 (GLOVE)
GLOVE ECLIPSE 8.0 STRL XLNG CF (GLOVE) ×1 IMPLANT
GLOVE ORTHO TXT STRL SZ7.5 (GLOVE) ×6 IMPLANT
GOWN STRL REUS W/TWL LRG LVL3 (GOWN DISPOSABLE) ×3 IMPLANT
GOWN STRL REUS W/TWL XL LVL3 (GOWN DISPOSABLE) ×3 IMPLANT
HANDPIECE INTERPULSE COAX TIP (DISPOSABLE) ×3
MANIFOLD NEPTUNE II (INSTRUMENTS) ×3 IMPLANT
PACK TOTAL KNEE CUSTOM (KITS) ×3 IMPLANT
POSITIONER SURGICAL ARM (MISCELLANEOUS) ×3 IMPLANT
SET HNDPC FAN SPRY TIP SCT (DISPOSABLE) ×1 IMPLANT
SET PAD KNEE POSITIONER (MISCELLANEOUS) ×3 IMPLANT
SUT MNCRL AB 4-0 PS2 18 (SUTURE) ×3 IMPLANT
SUT STRATAFIX 1PDS 45CM VIOLET (SUTURE) ×2 IMPLANT
SUT STRATAFIX SPIRAL PDS+ 70CM (SUTURE)
SUT VIC AB 1 CT1 36 (SUTURE) ×3 IMPLANT
SUT VIC AB 2-0 CT1 27 (SUTURE) ×9
SUT VIC AB 2-0 CT1 TAPERPNT 27 (SUTURE) ×3 IMPLANT
SUTURE STRATFX SPIRL PDS+ 70CM (SUTURE) IMPLANT
SYR 50ML LL SCALE MARK (SYRINGE) ×3 IMPLANT
TRAY FOLEY W/METER SILVER 16FR (SET/KITS/TRAYS/PACK) ×3 IMPLANT
WATER STERILE IRR 1000ML POUR (IV SOLUTION) ×3 IMPLANT
WRAP KNEE MAXI GEL POST OP (GAUZE/BANDAGES/DRESSINGS) ×3 IMPLANT
YANKAUER SUCT BULB TIP 10FT TU (MISCELLANEOUS) ×3 IMPLANT

## 2018-01-23 NOTE — Op Note (Signed)
NAME:  Alexander Castillo                      MEDICAL RECORD NO.:  [id number]                             FACILITY:  [facility]      PHYSICIAN:  Pietro Cassis. Alvan Dame, M.D.  DATE OF BIRTH:  Jul 14, 1941      DATE OF PROCEDURE:  01/23/2018                                     OPERATIVE REPORT         PREOPERATIVE DIAGNOSIS:  Right knee osteoarthritis.      POSTOPERATIVE DIAGNOSIS:  Right knee osteoarthritis.      FINDINGS:  The patient was noted to have complete loss of cartilage and   bone-on-bone arthritis with associated osteophytes in the medial and patellofemoral compartments of   the knee with a significant synovitis and associated effusion.      PROCEDURE:  Right total knee replacement.      COMPONENTS USED:  DePuy Attune rotating platform posterior stabilized knee   system, a size 7 femur, 7 tibia, size 5 mm PS AOX insert, and 38 anatomic patellar   button.      SURGEON:  Pietro Cassis. Alvan Dame, M.D.      ASSISTANT:  Nehemiah Massed, PA-C.      ANESTHESIA:  General and Regional.      SPECIMENS:  None.      COMPLICATION:  None.      DRAINS:  None.  EBL: <100cc      TOURNIQUET TIME:  29 min at 250 mmHg     The patient was stable to the recovery room.      INDICATION FOR PROCEDURE:  Alexander Castillo is a 77 y.o. male patient of   mine.  The patient had been seen, evaluated, and treated conservatively in the   office with medication, activity modification, and injections.  The patient had   radiographic changes of bone-on-bone arthritis with endplate sclerosis and osteophytes noted.      The patient failed conservative measures including medication, injections, and activity modification, and at this point was ready for more definitive measures.   Based on the radiographic changes and failed conservative measures, the patient   decided to proceed with total knee replacement.  Risks of infection,   DVT, component failure, need for revision surgery, postop course, and   expectations were  all   discussed and reviewed.  Consent was obtained for benefit of pain   relief.      PROCEDURE IN DETAIL:  The patient was brought to the operative theater.   Once adequate anesthesia, preoperative antibiotics, 2 gm of Ancef, 1 gm of Tranexamic Acid, 10 mg of Decadron administered, the patient was positioned supine with the right thigh tourniquet placed.  The  right lower extremity was prepped and draped in sterile fashion.  A time-   out was performed identifying the patient, planned procedure, and   extremity.      The right lower extremity was placed in the [facility] leg holder.  The leg was   exsanguinated, tourniquet elevated to 250 mmHg.  A midline incision was   made followed by median parapatellar arthrotomy.  Following initial   exposure, attention was first directed  to the patella.  Precut   measurement was noted to be 25 mm.  I resected down to 14 mm and used a   38 anatomic patellar button to restore patellar height as well as cover the cut   surface.      The lug holes were drilled and a metal shim was placed to protect the   patella from retractors and saw blades.      At this point, attention was now directed to the femur.  The femoral   canal was opened with a drill, irrigated to try to prevent fat emboli.  An   intramedullary rod was passed at 5 degrees valgus, 9 mm of bone was   resected off the distal femur.  Following this resection, the tibia was   subluxated anteriorly.  Using the extramedullary guide, 2 mm of bone was resected off   the proximal medial tibia.  We confirmed the gap would be   stable medially and laterally with a size 5 spacer block as well as confirmed   the cut was perpendicular in the coronal plane, checking with an alignment rod.      Once this was done, I sized the femur to be a size 7 in the anterior-   posterior dimension, chose a standard component based on medial and   lateral dimension.  The size 7 rotation block was then pinned in    position anterior referenced using the C-clamp to set rotation.  The   anterior, posterior, and  chamfer cuts were made without difficulty nor   notching making certain that I was along the anterior cortex to help   with flexion gap stability.      The final box cut was made off the lateral aspect of distal femur.      At this point, the tibia was sized to be a size 7, the size 7 tray was   then pinned in position through the medial third of the tubercle,   drilled, and keel punched.  Trial reduction was now carried with a 7 femur,  7 tibia, a size 5 mm PS insert, and the 38 anatomic patella botton.  The knee was brought to   extension, full extension with good flexion stability with the patella   tracking through the trochlea without application of pressure.  Given   all these findings the femoral lug holes were drilled and then the trial components removed.  Final components were   opened and cement was mixed.  The knee was irrigated with normal saline   solution and pulse lavage.  The synovial lining was   then injected with 30 cc of 0.25% Marcaine with epinephrine and 1 cc of Toradol plus 30 cc of NS for a total of 61 cc.      The knee was irrigated.  Final implants were then cemented onto clean and   dried cut surfaces of bone with the knee brought to extension with a size 5   mm PS trial insert.      Once the cement had fully cured, the excess cement was removed   throughout the knee.  I confirmed I was satisfied with the range of   motion and stability, and the final size 5 mm PS AOX insert was chosen.  It was   placed into the knee.      The tourniquet had been let down at 29 minutes.  No significant   hemostasis required.  The   extensor  mechanism was then reapproximated using #1 Vicryl and #1 Stratafix sutures with the knee   in flexion.  The   remaining wound was closed with 2-0 Vicryl and running 4-0 Monocryl.   The knee was cleaned, dried, dressed sterilely using  Dermabond and   Aquacel dressing.  The patient was then   brought to recovery room in stable condition, tolerating the procedure   well.   Please note that Physician Assistant, Nehemiah Massed, PA-C, was present for the entirety of the case, and was utilized for pre-operative positioning, peri-operative retractor management, general facilitation of the procedure.  He was also utilized for primary wound closure at the end of the case.              Pietro Cassis Alvan Dame, M.D.    01/23/2018 11:07 AM

## 2018-01-23 NOTE — Anesthesia Procedure Notes (Signed)
Anesthesia Regional Block: Adductor canal block   Pre-Anesthetic Checklist: ,, timeout performed, Correct Patient, Correct Site, Correct Laterality, Correct Procedure, Correct Position, site marked, Risks and benefits discussed,  Surgical consent,  Pre-op evaluation,  At surgeon's request and post-op pain management  Laterality: Right and Lower  Prep: chloraprep       Needles:  Injection technique: Single-shot  Needle Type: Echogenic Needle     Needle Length: 9cm  Needle Gauge: 21     Additional Needles:   Procedures:,,,, ultrasound used (permanent image in chart),,,,  Narrative:  Start time: 01/23/2018 10:08 AM End time: 01/23/2018 10:15 AM Injection made incrementally with aspirations every 5 mL.  Performed by: Personally  Anesthesiologist: Annye Asa, MD  Additional Notes: Pt identified in Holding room.  Monitors applied. Working IV access confirmed. Sterile prep, drape R thigh.  #21ga ECHOgenic needle into adductor canal with US guidance.  20cc 0.75% Ropivacaine injected incrementally after negative test dose.  Patient asymptomatic, VSS, no heme aspirated, tolerated well.  Jenita Seashore, MD

## 2018-01-23 NOTE — Discharge Instructions (Signed)

## 2018-01-23 NOTE — Transfer of Care (Signed)
Immediate Anesthesia Transfer of Care Note  Patient: Alexander Castillo  Procedure(s) Performed: RIGHT TOTAL KNEE ARTHROPLASTY (Right Knee)  Patient Location: PACU  Anesthesia Type:General  Level of Consciousness: sedated  Airway & Oxygen Therapy: Patient Spontanous Breathing and Patient connected to face mask oxygen  Post-op Assessment: Report given to RN and Post -op Vital signs reviewed and stable  Post vital signs: Reviewed and stable  Last Vitals:  Vitals:   01/23/18 1047 01/23/18 1048  BP:    Pulse: 65 66  Resp: 13 14  Temp:    SpO2: 100% 100%    Last Pain:  Vitals:   01/23/18 0920  TempSrc: Oral         Complications: No apparent anesthesia complications

## 2018-01-23 NOTE — Progress Notes (Signed)
AssistedDr. Carswell Jackson with right, ultrasound guided, adductor canal block. Side rails up, monitors on throughout procedure. See vital signs in flow sheet. Tolerated Procedure well.  

## 2018-01-23 NOTE — Anesthesia Procedure Notes (Signed)
Procedure Name: Intubation Date/Time: 01/23/2018 11:30 AM Performed by: Lind Covert, CRNA Pre-anesthesia Checklist: Patient identified, Emergency Drugs available, Suction available, Patient being monitored and Timeout performed Patient Re-evaluated:Patient Re-evaluated prior to induction Oxygen Delivery Method: Circle system utilized Preoxygenation: Pre-oxygenation with 100% oxygen Induction Type: IV induction Ventilation: Mask ventilation without difficulty Laryngoscope Size: Mac and 4 Grade View: Grade I Tube type: Oral Tube size: 7.5 mm Number of attempts: 1 Airway Equipment and Method: Stylet Placement Confirmation: ETT inserted through vocal cords under direct vision,  positive ETCO2 and breath sounds checked- equal and bilateral Secured at: 22 cm Tube secured with: Tape Dental Injury: Teeth and Oropharynx as per pre-operative assessment

## 2018-01-23 NOTE — Anesthesia Preprocedure Evaluation (Addendum)
Anesthesia Evaluation  Patient identified by MRN, date of birth, ID band Patient awake    Reviewed: Allergy & Precautions, NPO status , Patient's Chart, lab work & pertinent test results  History of Anesthesia Complications Negative for: history of anesthetic complications  Airway Mallampati: II  TM Distance: >3 FB Neck ROM: Full    Dental  (+) Edentulous Upper, Edentulous Lower   Pulmonary neg pulmonary ROS,    breath sounds clear to auscultation       Cardiovascular (-) hypertension(-) anginanegative cardio ROS   Rhythm:Regular Rate:Normal     Neuro/Psych Depression negative neurological ROS     GI/Hepatic negative GI ROS, Neg liver ROS,   Endo/Other  negative endocrine ROS  Renal/GU negative Renal ROS     Musculoskeletal  (+) Arthritis , Osteoarthritis,    Abdominal   Peds  Hematology negative hematology ROS (+)   Anesthesia Other Findings   Reproductive/Obstetrics                             Anesthesia Physical Anesthesia Plan  ASA: II  Anesthesia Plan: Spinal   Post-op Pain Management:  Regional for Post-op pain   Induction:   PONV Risk Score and Plan: 1 and Ondansetron and Dexamethasone  Airway Management Planned: Natural Airway and Simple Face Mask  Additional Equipment:   Intra-op Plan:   Post-operative Plan:   Informed Consent: I have reviewed the patients History and Physical, chart, labs and discussed the procedure including the risks, benefits and alternatives for the proposed anesthesia with the patient or authorized representative who has indicated his/her understanding and acceptance.     Plan Discussed with: CRNA and Surgeon  Anesthesia Plan Comments: (Plan routine monitors, SAB with adductor canal block for post op analgesia)        Anesthesia Quick Evaluation

## 2018-01-23 NOTE — Anesthesia Postprocedure Evaluation (Signed)
Anesthesia Post Note  Patient: Espen Bethel  Procedure(s) Performed: RIGHT TOTAL KNEE ARTHROPLASTY (Right Knee)     Patient location during evaluation: PACU Anesthesia Type: General and Regional Level of consciousness: awake and alert, oriented and patient cooperative Pain management: pain level controlled Vital Signs Assessment: post-procedure vital signs reviewed and stable Respiratory status: spontaneous breathing, nonlabored ventilation, respiratory function stable and patient connected to nasal cannula oxygen Cardiovascular status: blood pressure returned to baseline and stable Postop Assessment: no apparent nausea or vomiting and adequate PO intake Anesthetic complications: no    Last Vitals:  Vitals:   01/23/18 1500 01/23/18 1515  BP: (!) 159/85 (!) 159/91  Pulse: 69 74  Resp: 16 13  Temp:    SpO2: 98% 99%    Last Pain:  Vitals:   01/23/18 1500  TempSrc:   PainSc: 6                  Anais Koenen,E. Sarha Bartelt

## 2018-01-23 NOTE — Interval H&P Note (Signed)
History and Physical Interval Note:  01/23/2018 10:03 AM  Alexander Castillo  has presented today for surgery, with the diagnosis of Rt knee osteoarthritis  The various methods of treatment have been discussed with the patient and family. After consideration of risks, benefits and other options for treatment, the patient has consented to  Procedure(s) with comments: RIGHT TOTAL KNEE ARTHROPLASTY (Right) - 70 mins as a surgical intervention .  The patient's history has been reviewed, patient examined, no change in status, stable for surgery.  I have reviewed the patient's chart and labs.  Questions were answered to the patient's satisfaction.     Mauri Pole

## 2018-01-24 ENCOUNTER — Telehealth: Payer: Self-pay | Admitting: *Deleted

## 2018-01-24 DIAGNOSIS — E663 Overweight: Secondary | ICD-10-CM | POA: Diagnosis present

## 2018-01-24 DIAGNOSIS — Z87891 Personal history of nicotine dependence: Secondary | ICD-10-CM | POA: Diagnosis not present

## 2018-01-24 DIAGNOSIS — M25761 Osteophyte, right knee: Secondary | ICD-10-CM | POA: Diagnosis not present

## 2018-01-24 DIAGNOSIS — Z7982 Long term (current) use of aspirin: Secondary | ICD-10-CM | POA: Diagnosis not present

## 2018-01-24 DIAGNOSIS — M65861 Other synovitis and tenosynovitis, right lower leg: Secondary | ICD-10-CM | POA: Diagnosis not present

## 2018-01-24 DIAGNOSIS — Z96651 Presence of right artificial knee joint: Secondary | ICD-10-CM | POA: Diagnosis not present

## 2018-01-24 DIAGNOSIS — M1711 Unilateral primary osteoarthritis, right knee: Secondary | ICD-10-CM | POA: Diagnosis not present

## 2018-01-24 LAB — CBC
HEMATOCRIT: 37.4 % — AB (ref 39.0–52.0)
HEMOGLOBIN: 12.5 g/dL — AB (ref 13.0–17.0)
MCH: 31.6 pg (ref 26.0–34.0)
MCHC: 33.4 g/dL (ref 30.0–36.0)
MCV: 94.4 fL (ref 78.0–100.0)
PLATELETS: 192 10*3/uL (ref 150–400)
RBC: 3.96 MIL/uL — AB (ref 4.22–5.81)
RDW: 13.2 % (ref 11.5–15.5)
WBC: 12.8 10*3/uL — AB (ref 4.0–10.5)

## 2018-01-24 LAB — BASIC METABOLIC PANEL
Anion gap: 10 (ref 5–15)
BUN: 10 mg/dL (ref 6–20)
CALCIUM: 8.4 mg/dL — AB (ref 8.9–10.3)
CO2: 22 mmol/L (ref 22–32)
CREATININE: 0.8 mg/dL (ref 0.61–1.24)
Chloride: 106 mmol/L (ref 101–111)
GFR calc non Af Amer: >60 mL/min (ref >60)
Glucose, Bld: 101 mg/dL — ABNORMAL HIGH (ref 65–99)
Potassium: 3.9 mmol/L (ref 3.5–5.1)
SODIUM: 138 mmol/L (ref 135–145)

## 2018-01-24 NOTE — Progress Notes (Signed)
     Subjective: 1 Day Post-Op Procedure(s) (LRB): RIGHT TOTAL KNEE ARTHROPLASTY (Right)   Patient reports pain as mild, pain controlled. No events throughout the night.  Feels that he is doing really well and looking forward to getting home.  States that he already has people lined up to help him in the post-op period.  Ready to be discharged home if he does well with PT.  Objective:   VITALS:   Vitals:   01/24/18 0226 01/24/18 0517  BP: 122/72 119/74  Pulse: 88 79  Resp: 15 16  Temp: 98.3 F (36.8 C) 97.8 F (36.6 C)  SpO2: 98% 97%    Dorsiflexion/Plantar flexion intact Incision: dressing C/D/I No cellulitis present Compartment soft  LABS Recent Labs    01/24/18 0529  HGB 12.5*  HCT 37.4*  WBC 12.8*  PLT 192    Recent Labs    01/24/18 0529  NA 138  K 3.9  BUN 10  CREATININE 0.80  GLUCOSE 101*     Assessment/Plan: 1 Day Post-Op Procedure(s) (LRB): RIGHT TOTAL KNEE ARTHROPLASTY (Right) Foley cath d/c'ed Advance diet Up with therapy D/C IV fluids Discharge home Follow up in 2 weeks at [facility]. Follow up with OLIN,Lasharn Bufkin D in 2 weeks.  Contact information:  [facility] [address] [place] [phone]    Overweight (BMI 25-29.9)  Estimated body mass index is 25.99 kg/m as calculated from the following:   Height as of this encounter: 5\' 9"  (1.753 m).   Weight as of this encounter: 79.8 kg (176 lb). Patient also counseled that weight may inhibit the healing process Patient counseled that losing weight will help with future health issues         Alexander Castillo   PAC  01/24/2018, 9:12 AM

## 2018-01-24 NOTE — Evaluation (Signed)
Occupational Therapy Evaluation Patient Details Name: Alexander Castillo MRN: [id number] DOB: [birth year] Today's Date: 01/24/2018    History of Present Illness R TKA, H/O DA THA   Clinical Impression   This 77 year old man was admitted for the above sx.  All education was completed. No further OT is needed at this time    Follow Up Recommendations  Supervision/Assistance - 24 hour    Equipment Recommendations  None recommended by OT(pt can get a shower seat)    Recommendations for Other Services       Precautions / Restrictions Precautions Precautions: Knee Restrictions Weight Bearing Restrictions: No      Mobility Bed Mobility Overal bed mobility: Modified Independent             General bed mobility comments: oob  Transfers Overall transfer level: Needs assistance Equipment used: Rolling walker (2 wheeled) Transfers: Sit to/from Stand Sit to Stand: Min guard         General transfer comment: cues for safety    Balance                                           ADL either performed or assessed with clinical judgement   ADL Overall ADL's : Needs assistance/impaired Eating/Feeding: Independent   Grooming: Min guard;Standing   Upper Body Bathing: Set up;Sitting   Lower Body Bathing: Minimal assistance;Sit to/from stand   Upper Body Dressing : Set up;Sitting   Lower Body Dressing: Moderate assistance;Sit to/from stand   Toilet Transfer: Min guard;Ambulation;BSC   Toileting- Water quality scientist and Hygiene: Min guard;Sit to/from stand   Tub/ Shower Transfer: Walk-in shower;Min guard;Ambulation;3 in 1     General ADL Comments: practiced bathroom transfers.  Pt states he can get a seat for his shower, and this would be safer.  brother in law can assist as needed     Vision         Perception     Praxis      Pertinent Vitals/Pain Pain Assessment: 0-10 Pain Score: 2  Pain Location: right knee Pain Descriptors /  Indicators: Sore Pain Intervention(s): Limited activity within patient's tolerance;Monitored during session;Premedicated before session;Repositioned;Ice applied     Hand Dominance     Extremity/Trunk Assessment Upper Extremity Assessment Upper Extremity Assessment: Overall WFL for tasks assessed          Communication Communication Communication: No difficulties   Cognition Arousal/Alertness: Awake/alert Behavior During Therapy: WFL for tasks assessed/performed Overall Cognitive Status: Within Functional Limits for tasks assessed                                     General Comments       Exercises    Shoulder Instructions      Home Living Family/patient expects to be discharged to:: Private residence Living Arrangements: Other relatives Available Help at Discharge: Family Type of Home: House Home Access: Stairs to enter Technical brewer of Steps: 2 Entrance Stairs-Rails: None Home Layout: One level     Bathroom Shower/Tub: Occupational psychologist: Standard     Home Equipment: Toilet riser   Additional Comments: pt states he can get a seat for the shower      Prior Functioning/Environment Level of Independence: Independent  OT Problem List:        OT Treatment/Interventions:      OT Goals(Current goals can be found in the care plan section) Acute Rehab OT Goals Patient Stated Goal: to go home  OT Goal Formulation: All assessment and education complete, DC therapy  OT Frequency:     Barriers to D/C:            Co-evaluation              AM-PAC PT "6 Clicks" Daily Activity     Outcome Measure Help from another person eating meals?: None Help from another person taking care of personal grooming?: A Little Help from another person toileting, which includes using toliet, bedpan, or urinal?: A Little Help from another person bathing (including washing, rinsing, drying)?: A Little Help from  another person to put on and taking off regular upper body clothing?: A Little Help from another person to put on and taking off regular lower body clothing?: A Lot 6 Click Score: 18   End of Session    Activity Tolerance: Patient tolerated treatment well Patient left: in chair;with call bell/phone within reach;with chair alarm set  OT Visit Diagnosis: Pain Pain - Right/Left: Right Pain - part of body: Knee                Time: 1157-2620 OT Time Calculation (min): 14 min Charges:  OT General Charges $OT Visit: 1 Visit OT Evaluation $OT Eval Low Complexity: 1 Low G-Codes:     [place], OTR/L [phone] 01/24/2018  Alexander Castillo 01/24/2018, 12:21 PM

## 2018-01-24 NOTE — Progress Notes (Signed)
RN reviewed discharge instructions with patient and family. All questions answered.   Paperwork and prescriptions sent with patient.   RN rolled patient down with all belongings to family car.

## 2018-01-24 NOTE — Telephone Encounter (Signed)
Pt was on TCM list admitted 01/23/18 for right total knee arthroplasty. The risks and benefits of total knee arthroplasty were presented.Pt was given inpatient treatment for surgery, pain control, PT, OT, prophylactic antibiotics, VTE prophylaxis, progressive ambulation and ADL's. Pt was D/C 01/24/18, and will follow w/specilaist Dr. Alvan Dame in 2 weeks.Marland KitchenJohny Chess

## 2018-01-24 NOTE — Evaluation (Signed)
Physical Therapy Evaluation Patient Details Name: Alexander Castillo MRN: [id number] DOB: 03-11-1941 Today's Date: 01/24/2018   History of Present Illness  R TKA, H/O DA THA  Clinical Impression  The patient ambulated x 200'. Will practice steps then plans to DC home. Pt admitted with above diagnosis. Pt currently with functional limitations due to the deficits listed below (see PT Problem List).  Pt will benefit from skilled PT to increase their independence and safety with mobility to allow discharge to the venue listed below.      Follow Up Recommendations Follow surgeon's recommendation for DC plan and follow-up therapies    Equipment Recommendations  None recommended by PT    Recommendations for Other Services       Precautions / Restrictions Precautions Precautions: Knee      Mobility  Bed Mobility Overal bed mobility: Modified Independent                Transfers Overall transfer level: Needs assistance Equipment used: Rolling walker (2 wheeled) Transfers: Sit to/from Stand Sit to Stand: Min guard         General transfer comment: cues for safety  Ambulation/Gait Ambulation/Gait assistance: Min assist Ambulation Distance (Feet): 200 Feet Assistive device: Rolling walker (2 wheeled) Gait Pattern/deviations: Step-to pattern;Step-through pattern;Decreased step length - right;Decreased stance time - right     General Gait Details: cues for safety and sequence  Stairs            Wheelchair Mobility    Modified Rankin (Stroke Patients Only)       Balance                                             Pertinent Vitals/Pain Pain Assessment: 0-10 Pain Score: 3  Pain Location: right knee Pain Descriptors / Indicators: Sore;Grimacing Pain Intervention(s): Repositioned;Premedicated before session;Monitored during session;Ice applied    Home Living Family/patient expects to be discharged to:: Private residence Living  Arrangements: Other relatives  Brother -in-law Available Help at Discharge: Family Type of Home: House Home Access: Stairs to enter Entrance Stairs-Rails: None Technical brewer of Steps: 2 Home Layout: One level Home Equipment: Environmental consultant - 2 wheels      Prior Function Level of Independence: Independent               Hand Dominance        Extremity/Trunk Assessment   Upper Extremity Assessment Upper Extremity Assessment: Defer to OT evaluation    Lower Extremity Assessment Lower Extremity Assessment: RLE deficits/detail;LLE deficits/detail RLE Deficits / Details: 10-70 knee flexion LLE Deficits / Details: during gait, the knee appears to hyperectend at times.       Communication   Communication: No difficulties  Cognition Arousal/Alertness: Awake/alert Behavior During Therapy: WFL for tasks assessed/performed Overall Cognitive Status: Within Functional Limits for tasks assessed                                        General Comments      Exercises Total Joint Exercises Ankle Circles/Pumps: AROM;Both;10 reps Quad Sets: AROM;Both;10 reps Towel Squeeze: AROM;Both;10 reps Short Arc Quad: AROM;Both;10 reps Heel Slides: AAROM;Right;10 reps Hip ABduction/ADduction: AROM;Right;10 reps Straight Leg Raises: AROM;Right;10 reps   Assessment/Plan    PT Assessment Patient needs continued PT services  PT Problem  List Decreased strength;Decreased range of motion;Decreased knowledge of use of DME;Decreased activity tolerance;Decreased safety awareness;Decreased knowledge of precautions;Decreased mobility;Pain       PT Treatment Interventions DME instruction;Gait training;Stair training;Functional mobility training;Therapeutic activities;Therapeutic exercise;Patient/family education    PT Goals (Current goals can be found in the Care Plan section)  Acute Rehab PT Goals Patient Stated Goal: to go home  PT Goal Formulation: With patient Time For  Goal Achievement: 01/26/18 Potential to Achieve Goals: Good    Frequency 7X/week   Barriers to discharge        Co-evaluation               AM-PAC PT "6 Clicks" Daily Activity  Outcome Measure Difficulty turning over in bed (including adjusting bedclothes, sheets and blankets)?: None Difficulty moving from lying on back to sitting on the side of the bed? : None Difficulty sitting down on and standing up from a chair with arms (e.g., wheelchair, bedside commode, etc,.)?: A Little Help needed moving to and from a bed to chair (including a wheelchair)?: A Little Help needed walking in hospital room?: A Little Help needed climbing 3-5 steps with a railing? : A Little 6 Click Score: 20    End of Session   Activity Tolerance: Patient tolerated treatment well Patient left: in chair;with call bell/phone within reach;with chair alarm set Nurse Communication: Mobility status PT Visit Diagnosis: Unsteadiness on feet (R26.81);Pain Pain - Right/Left: Right Pain - part of body: Knee    Time: 1638-4536 PT Time Calculation (min) (ACUTE ONLY): 44 min   Charges:   PT Evaluation $PT Eval Low Complexity: 1 Low PT Treatments $Gait Training: 8-22 mins $Therapeutic Exercise: 8-22 mins   PT G CodesTresa Endo PT [phone]   Claretha Cooper 01/24/2018, 9:29 AM

## 2018-01-24 NOTE — Progress Notes (Signed)
Physical Therapy Treatment Patient Details Name: Alexander Castillo MRN: [id number] DOB: 1941/01/20 Today's Date: 01/24/2018    History of Present Illness R TKA, H/O DA THA    PT Comments    The patient practiced steps. Ready for DC. Required  Cues for safety using RW and not walking away.    Follow Up Recommendations  Follow surgeon's recommendation for DC plan and follow-up therapies     Equipment Recommendations  None recommended by PT    Recommendations for Other Services       Precautions / Restrictions Precautions Precautions: Knee;Fall Restrictions Weight Bearing Restrictions: No    Mobility  Bed Mobility Overal bed mobility: Modified Independent             General bed mobility comments: oob  Transfers Overall transfer level: Needs assistance Equipment used: Rolling walker (2 wheeled) Transfers: Sit to/from Stand Sit to Stand: Supervision         General transfer comment: cues for safety  Ambulation/Gait Ambulation/Gait assistance: Supervision Ambulation Distance (Feet): 140 Feet Assistive device: Rolling walker (2 wheeled) Gait Pattern/deviations: Step-to pattern;Step-through pattern;Decreased step length - right;Decreased stance time - right     General Gait Details: cues for safety and sequence   Stairs Stairs: Yes   Stair Management: Forwards;Step to pattern;With cane Number of Stairs: 2 General stair comments: cues for safety, multimodal cues for  technique  Wheelchair Mobility    Modified Rankin (Stroke Patients Only)       Balance                                            Cognition Arousal/Alertness: Awake/alert Behavior During Therapy: WFL for tasks assessed/performed Overall Cognitive Status: Within Functional Limits for tasks assessed                                        Exercises Total Joint Exercises Ankle Circles/Pumps: AROM;Both;10 reps Quad Sets: AROM;Both;10 reps Towel  Squeeze: AROM;Both;10 reps Short Arc Quad: AROM;Both;10 reps Heel Slides: AAROM;Right;10 reps Hip ABduction/ADduction: AROM;Right;10 reps Straight Leg Raises: AROM;Right;10 reps    General Comments        Pertinent Vitals/Pain Pain Assessment: 0-10 Pain Score: 2  Pain Location: right knee Pain Descriptors / Indicators: Sore Pain Intervention(s): Monitored during session;Premedicated before session    Home Living Family/patient expects to be discharged to:: Private residence Living Arrangements: Other relatives Available Help at Discharge: Family     Entrance Stairs-Rails: None Home Layout: One level Home Equipment: Toilet riser Additional Comments: pt states he can get a seat for the shower    Prior Function Level of Independence: Independent          PT Goals (current goals can now be found in the care plan section) Acute Rehab PT Goals Patient Stated Goal: to go home  PT Goal Formulation: With patient Time For Goal Achievement: 01/26/18 Potential to Achieve Goals: Good Progress towards PT goals: Progressing toward goals    Frequency    7X/week      PT Plan Current plan remains appropriate    Co-evaluation              AM-PAC PT "6 Clicks" Daily Activity  Outcome Measure  Difficulty turning over in bed (including adjusting bedclothes, sheets and blankets)?: None Difficulty  moving from lying on back to sitting on the side of the bed? : None Difficulty sitting down on and standing up from a chair with arms (e.g., wheelchair, bedside commode, etc,.)?: A Little Help needed moving to and from a bed to chair (including a wheelchair)?: A Little Help needed walking in hospital room?: A Little Help needed climbing 3-5 steps with a railing? : A Little 6 Click Score: 20    End of Session   Activity Tolerance: Patient tolerated treatment well Patient left: in chair;with call bell/phone within reach;with chair alarm set Nurse Communication: Mobility  status PT Visit Diagnosis: Unsteadiness on feet (R26.81);Pain Pain - Right/Left: Right Pain - part of body: Knee     Time: 1100-1140 PT Time Calculation (min) (ACUTE ONLY): 40 min  Charges:  $Gait Training: 8-22 mins $Therapeutic Exercise: 8-22 mins $Self Care/Home Management: 08-20-23                    G Codes:          Claretha Cooper 01/24/2018, 12:45 PM

## 2018-01-26 DIAGNOSIS — M25561 Pain in right knee: Secondary | ICD-10-CM | POA: Diagnosis not present

## 2018-01-29 DIAGNOSIS — M25561 Pain in right knee: Secondary | ICD-10-CM | POA: Diagnosis not present

## 2018-01-29 NOTE — Discharge Summary (Signed)
Physician Discharge Summary  Patient ID: Alexander Castillo MRN: [id number] DOB/AGE: [birth year] 77 y.o.  Admit date: 01/23/2018 Discharge date: 01/24/2018   Procedures:  Procedure(s) (LRB): RIGHT TOTAL KNEE ARTHROPLASTY (Right)  Attending Physician:  Dr. Paralee Cancel   Admission Diagnoses:   Right knee primary OA / pain  Discharge Diagnoses:  Principal Problem:   S/P right TKA Active Problems:   Overweight (BMI 25.0-29.9)  Past Medical History:  Diagnosis Date  . BENIGN PROSTATIC HYPERTROPHY 03/10/2008  . BENIGN PROSTATIC HYPERTROPHY, HX OF 03/10/2008  . CHEST PAIN 03/10/2008   at pre-op 01-17-18: per patient had cardiac eval at that time was sent for test (uaware type) ; reports nothing was abnormal; no recurrence of chest pain   . DEGENERATIVE JOINT DISEASE 03/10/2008  . EAR PAIN, LEFT 12/09/2008  . HYPERLIPIDEMIA 03/10/2008  . PSA, INCREASED 03/16/2009  . RASH-NONVESICULAR 03/16/2009    HPI:    Alexander Castillo, 77 y.o. male, has a history of pain and functional disability in the right knee due to arthritis and has failed non-surgical conservative treatments for greater than 12 weeks to include NSAID's and/or analgesics, corticosteriod injections, viscosupplementation injections and activity modification.  Onset of symptoms was gradual, starting 1-2 years ago with gradually worsening course since that time. The patient noted no past surgery on the right knee(s).  Patient currently rates pain in the right knee(s) at 9 out of 10 with activity. Patient has worsening of pain with activity and weight bearing, pain that interferes with activities of daily living, pain with passive range of motion, crepitus and joint swelling.  Patient has evidence of periarticular osteophytes and joint space narrowing by imaging studies.  There is no active infection.  Risks, benefits and expectations were discussed with the patient.  Risks including but not limited to the risk of anesthesia, blood clots, nerve  damage, blood vessel damage, failure of the prosthesis, infection and up to and including death.  Patient understand the risks, benefits and expectations and wishes to proceed with surgery.   PCP: Biagio Borg, MD   Discharged Condition: good  Hospital Course:  Patient underwent the above stated procedure on 01/23/2018. Patient tolerated the procedure well and brought to the recovery room in good condition and subsequently to the floor.  POD #1 BP: 119/74 ; Pulse: 79 ; Temp: 97.8 F (36.6 C) ; Resp: 16 Patient reports pain as mild, pain controlled. No events throughout the night.  Feels that he is doing really well and looking forward to getting home.  States that he already has people lined up to help him in the post-op period.  Ready to be discharged home Dorsiflexion/plantar flexion intact, incision: dressing C/D/I, no cellulitis present and compartment soft.   LABS  Basename    HGB     12.5  HCT     37.4    Discharge Exam: General appearance: alert, cooperative and no distress Extremities: Homans sign is negative, no sign of DVT, no edema, redness or tenderness in the calves or thighs and no ulcers, gangrene or trophic changes  Disposition: Home with follow up in 2 weeks   Follow-up Information    Paralee Cancel, MD. Schedule an appointment as soon as possible for a visit in 2 week(s).   Specialty:  Orthopedic Surgery Contact information: [address] [place] [address] [phone]           Discharge Instructions    Call MD / Call 911   Complete by:  As  directed    If you experience chest pain or shortness of breath, CALL 911 and be transported to the hospital emergency room.  If you develope a fever above 101 F, pus (white drainage) or increased drainage or redness at the wound, or calf pain, call your surgeon's office.   Change dressing   Complete by:  As directed    Maintain surgical dressing until follow up in the clinic. If the edges start to  pull up, may reinforce with tape. If the dressing is no longer working, may remove and cover with gauze and tape, but must keep the area dry and clean.  Call with any questions or concerns.   Constipation Prevention   Complete by:  As directed    Drink plenty of fluids.  Prune juice may be helpful.  You may use a stool softener, such as Colace (over the counter) 100 mg twice a day.  Use MiraLax (over the counter) for constipation as needed.   Diet - low sodium heart healthy   Complete by:  As directed    Discharge instructions   Complete by:  As directed    Maintain surgical dressing until follow up in the clinic. If the edges start to pull up, may reinforce with tape. If the dressing is no longer working, may remove and cover with gauze and tape, but must keep the area dry and clean.  Follow up in 2 weeks at [facility]. Call with any questions or concerns.   Increase activity slowly as tolerated   Complete by:  As directed    Weight bearing as tolerated with assist device (walker, cane, etc) as directed, use it as long as suggested by your surgeon or therapist, typically at least 4-6 weeks.   TED hose   Complete by:  As directed    Use stockings (TED hose) for 2 weeks on both leg(s).  You may remove them at night for sleeping.      Allergies as of 01/24/2018   No Known Allergies     Medication List    STOP taking these medications   aspirin 81 MG tablet Replaced by:  aspirin 81 MG chewable tablet   ibuprofen 200 MG tablet Commonly known as:  ADVIL,MOTRIN     TAKE these medications   aspirin 81 MG chewable tablet Commonly known as:  ASPIRIN CHILDRENS Chew 1 tablet (81 mg total) by mouth 2 (two) times daily. Take for 4 weeks, then resume regular dose. Replaces:  aspirin 81 MG tablet   celecoxib 200 MG capsule Commonly known as:  CELEBREX Take 1 capsule (200 mg total) by mouth 2 (two) times daily.   docusate sodium 100 MG capsule Commonly known as:  COLACE Take 1  capsule (100 mg total) by mouth 2 (two) times daily.   ferrous sulfate 325 (65 FE) MG tablet Commonly known as:  FERROUSUL Take 1 tablet (325 mg total) by mouth 3 (three) times daily with meals.   HYDROcodone-acetaminophen 7.5-325 MG tablet Commonly known as:  NORCO Take 1-2 tablets by mouth every 4 (four) hours as needed for moderate pain. Notes to patient:  Narcotic pain medication   methocarbamol 500 MG tablet Commonly known as:  ROBAXIN Take 1 tablet (500 mg total) by mouth every 6 (six) hours as needed for muscle spasms. Notes to patient:  Muscle relaxer   polyethylene glycol packet Commonly known as:  MIRALAX / GLYCOLAX Take 17 g by mouth 2 (two) times daily.  Discharge Care Instructions  (From admission, onward)        Start     Ordered   01/24/18 0000  Change dressing    Comments:  Maintain surgical dressing until follow up in the clinic. If the edges start to pull up, may reinforce with tape. If the dressing is no longer working, may remove and cover with gauze and tape, but must keep the area dry and clean.  Call with any questions or concerns.   01/24/18 [id number]       Signed: West Pugh. Iyona Pehrson   PA-C  01/29/2018, 4:01 PM

## 2018-01-31 DIAGNOSIS — M25561 Pain in right knee: Secondary | ICD-10-CM | POA: Diagnosis not present

## 2018-02-02 DIAGNOSIS — M25561 Pain in right knee: Secondary | ICD-10-CM | POA: Diagnosis not present

## 2018-02-05 DIAGNOSIS — M25561 Pain in right knee: Secondary | ICD-10-CM | POA: Diagnosis not present

## 2018-02-07 DIAGNOSIS — M25561 Pain in right knee: Secondary | ICD-10-CM | POA: Diagnosis not present

## 2018-02-12 DIAGNOSIS — M25561 Pain in right knee: Secondary | ICD-10-CM | POA: Diagnosis not present

## 2018-02-15 DIAGNOSIS — M25561 Pain in right knee: Secondary | ICD-10-CM | POA: Diagnosis not present

## 2018-03-07 DIAGNOSIS — Z96651 Presence of right artificial knee joint: Secondary | ICD-10-CM | POA: Diagnosis not present

## 2018-05-04 DIAGNOSIS — M25661 Stiffness of right knee, not elsewhere classified: Secondary | ICD-10-CM | POA: Diagnosis not present

## 2018-05-08 DIAGNOSIS — L821 Other seborrheic keratosis: Secondary | ICD-10-CM | POA: Diagnosis not present

## 2018-05-08 DIAGNOSIS — Z85820 Personal history of malignant melanoma of skin: Secondary | ICD-10-CM | POA: Diagnosis not present

## 2018-05-08 DIAGNOSIS — L812 Freckles: Secondary | ICD-10-CM | POA: Diagnosis not present

## 2018-05-08 DIAGNOSIS — D1801 Hemangioma of skin and subcutaneous tissue: Secondary | ICD-10-CM | POA: Diagnosis not present

## 2018-05-08 DIAGNOSIS — D225 Melanocytic nevi of trunk: Secondary | ICD-10-CM | POA: Diagnosis not present

## 2018-05-09 DIAGNOSIS — M25661 Stiffness of right knee, not elsewhere classified: Secondary | ICD-10-CM | POA: Diagnosis not present

## 2018-05-14 DIAGNOSIS — M25661 Stiffness of right knee, not elsewhere classified: Secondary | ICD-10-CM | POA: Diagnosis not present

## 2018-05-16 DIAGNOSIS — M25661 Stiffness of right knee, not elsewhere classified: Secondary | ICD-10-CM | POA: Diagnosis not present

## 2018-05-22 DIAGNOSIS — M25561 Pain in right knee: Secondary | ICD-10-CM | POA: Diagnosis not present

## 2018-05-24 DIAGNOSIS — M25561 Pain in right knee: Secondary | ICD-10-CM | POA: Diagnosis not present

## 2018-05-24 DIAGNOSIS — Z96651 Presence of right artificial knee joint: Secondary | ICD-10-CM | POA: Diagnosis not present

## 2018-06-23 DIAGNOSIS — Z96651 Presence of right artificial knee joint: Secondary | ICD-10-CM | POA: Diagnosis not present

## 2018-07-11 DIAGNOSIS — M25561 Pain in right knee: Secondary | ICD-10-CM | POA: Diagnosis not present

## 2018-07-16 ENCOUNTER — Encounter: Payer: Self-pay | Admitting: Internal Medicine

## 2018-07-16 ENCOUNTER — Other Ambulatory Visit (INDEPENDENT_AMBULATORY_CARE_PROVIDER_SITE_OTHER): Payer: Medicare Other

## 2018-07-16 ENCOUNTER — Ambulatory Visit (INDEPENDENT_AMBULATORY_CARE_PROVIDER_SITE_OTHER): Payer: Medicare Other | Admitting: Internal Medicine

## 2018-07-16 VITALS — BP 116/68 | HR 73 | Temp 97.7°F | Ht 69.0 in | Wt 168.0 lb

## 2018-07-16 DIAGNOSIS — Z0000 Encounter for general adult medical examination without abnormal findings: Secondary | ICD-10-CM

## 2018-07-16 DIAGNOSIS — Z23 Encounter for immunization: Secondary | ICD-10-CM | POA: Diagnosis not present

## 2018-07-16 LAB — TSH: TSH: 2.05 u[IU]/mL (ref 0.35–4.50)

## 2018-07-16 LAB — CBC WITH DIFFERENTIAL/PLATELET
Basophils Absolute: 0 10*3/uL (ref 0.0–0.1)
Basophils Relative: 0.7 % (ref 0.0–3.0)
EOS ABS: 0.1 10*3/uL (ref 0.0–0.7)
EOS PCT: 0.9 % (ref 0.0–5.0)
HEMATOCRIT: 43.6 % (ref 39.0–52.0)
HEMOGLOBIN: 14.6 g/dL (ref 13.0–17.0)
LYMPHS PCT: 23.8 % (ref 12.0–46.0)
Lymphs Abs: 1.5 10*3/uL (ref 0.7–4.0)
MCHC: 33.5 g/dL (ref 30.0–36.0)
MCV: 90.4 fl (ref 78.0–100.0)
Monocytes Absolute: 0.5 10*3/uL (ref 0.1–1.0)
Monocytes Relative: 8.5 % (ref 3.0–12.0)
Neutro Abs: 4.1 10*3/uL (ref 1.4–7.7)
Neutrophils Relative %: 66.1 % (ref 43.0–77.0)
Platelets: 241 10*3/uL (ref 150.0–400.0)
RBC: 4.82 Mil/uL (ref 4.22–5.81)
RDW: 14.4 % (ref 11.5–15.5)
WBC: 6.2 10*3/uL (ref 4.0–10.5)

## 2018-07-16 LAB — URINALYSIS, ROUTINE W REFLEX MICROSCOPIC
BILIRUBIN URINE: NEGATIVE
Hgb urine dipstick: NEGATIVE
Ketones, ur: NEGATIVE
Leukocytes, UA: NEGATIVE
Nitrite: NEGATIVE
RBC / HPF: NONE SEEN (ref 0–?)
SPECIFIC GRAVITY, URINE: 1.015 (ref 1.000–1.030)
Total Protein, Urine: NEGATIVE
Urine Glucose: NEGATIVE
Urobilinogen, UA: 0.2 (ref 0.0–1.0)
WBC UA: NONE SEEN (ref 0–?)
pH: 5.5 (ref 5.0–8.0)

## 2018-07-16 LAB — HEPATIC FUNCTION PANEL
ALT: 16 U/L (ref 0–53)
AST: 26 U/L (ref 0–37)
Albumin: 4.3 g/dL (ref 3.5–5.2)
Alkaline Phosphatase: 54 U/L (ref 39–117)
BILIRUBIN DIRECT: 0.1 mg/dL (ref 0.0–0.3)
Total Bilirubin: 0.8 mg/dL (ref 0.2–1.2)
Total Protein: 6.5 g/dL (ref 6.0–8.3)

## 2018-07-16 LAB — BASIC METABOLIC PANEL
BUN: 17 mg/dL (ref 6–23)
CHLORIDE: 105 meq/L (ref 96–112)
CO2: 27 meq/L (ref 19–32)
Calcium: 9.6 mg/dL (ref 8.4–10.5)
Creatinine, Ser: 0.74 mg/dL (ref 0.40–1.50)
GFR: 108.31 mL/min (ref >60.00)
Glucose, Bld: 98 mg/dL (ref 70–99)
POTASSIUM: 3.8 meq/L (ref 3.5–5.1)
SODIUM: 140 meq/L (ref 135–145)

## 2018-07-16 LAB — LIPID PANEL
CHOL/HDL RATIO: 3
Cholesterol: 142 mg/dL (ref 0–200)
HDL: 44.7 mg/dL (ref >39.00)
LDL CALC: 65 mg/dL (ref 0–99)
NonHDL: 96.86
Triglycerides: 159 mg/dL — ABNORMAL HIGH (ref 0.0–149.0)
VLDL: 31.8 mg/dL (ref 0.0–40.0)

## 2018-07-16 LAB — PSA: PSA: 1.83 ng/mL (ref 0.10–4.00)

## 2018-07-16 NOTE — Progress Notes (Signed)
Subjective:    Patient ID: Alexander Castillo, male    DOB: 03/29/1941, 77 y.o.   MRN: [id number]  HPI  Here for wellness and f/u;  Overall doing ok;  Pt denies Chest pain, worsening SOB, DOE, wheezing, orthopnea, PND, worsening LE edema, palpitations, dizziness or syncope.  Pt denies neurological change such as new headache, facial or extremity weakness.  Pt denies polydipsia, polyuria, or low sugar symptoms. Pt states overall good compliance with treatment and medications, good tolerability, and has been trying to follow appropriate diet.  Pt denies worsening depressive symptoms, suicidal ideation or panic. No fever, night sweats, wt loss, loss of appetite, or other constitutional symptoms.  Pt states good ability with ADL's, has low fall risk, home safety reviewed and adequate, no other significant changes in hearing or vision, and only occasionally active with exercise.  No new complaints Wt Readings from Last 3 Encounters:  07/16/18 168 lb (76.2 kg)  01/23/18 176 lb (79.8 kg)  01/17/18 176 lb (79.8 kg)   BP Readings from Last 3 Encounters:  07/16/18 116/68  01/24/18 124/74  01/17/18 140/67  s/p right knee TKR feb 2019 and doing well except just does not have quite full ROM yet to extension.    Past Medical History:  Diagnosis Date  . BENIGN PROSTATIC HYPERTROPHY 03/10/2008  . BENIGN PROSTATIC HYPERTROPHY, HX OF 03/10/2008  . CHEST PAIN 03/10/2008   at pre-op 01-17-18: per patient had cardiac eval at that time was sent for test (uaware type) ; reports nothing was abnormal; no recurrence of chest pain   . DEGENERATIVE JOINT DISEASE 03/10/2008  . EAR PAIN, LEFT 12/09/2008  . HYPERLIPIDEMIA 03/10/2008  . PSA, INCREASED 03/16/2009  . RASH-NONVESICULAR 03/16/2009   Past Surgical History:  Procedure Laterality Date  . back surgury  1998   lower  . EYE SURGERY Bilateral    lens replacement for cataracts  . hip replacement left Left 2002  . s/p left knee arthroscopy  yrs ago  . TOTAL HIP  ARTHROPLASTY Right 06/13/2016   Procedure: RIGHT TOTAL HIP ARTHROPLASTY ANTERIOR APPROACH;  Surgeon: Paralee Cancel, MD;  Location: [facility];  Service: Orthopedics;  Laterality: Right;  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  . TOTAL KNEE ARTHROPLASTY Right 01/23/2018   Procedure: RIGHT TOTAL KNEE ARTHROPLASTY;  Surgeon: Paralee Cancel, MD;  Location: [facility];  Service: Orthopedics;  Laterality: Right;  70 mins  . transurethral resection of prostate      reports that he has never smoked. He has quit using smokeless tobacco. He reports that he does not drink alcohol or use drugs. family history includes Dementia in his mother. No Known Allergies No current outpatient medications on file prior to visit.   No current facility-administered medications on file prior to visit.    Review of Systems Constitutional: Negative for other unusual diaphoresis, sweats, appetite or weight changes HENT: Negative for other worsening hearing loss, ear pain, facial swelling, mouth sores or neck stiffness.   Eyes: Negative for other worsening pain, redness or other visual disturbance.  Respiratory: Negative for other stridor or swelling Cardiovascular: Negative for other palpitations or other chest pain  Gastrointestinal: Negative for worsening diarrhea or loose stools, blood in stool, distention or other pain Genitourinary: Negative for hematuria, flank pain or other change in urine volume.  Musculoskeletal: Negative for myalgias or other joint swelling.  Skin: Negative for other color change, or other wound or worsening drainage.  Neurological: Negative for other syncope or numbness. Hematological: Negative for other adenopathy or  swelling Psychiatric/Behavioral: Negative for hallucinations, other worsening agitation, SI, self-injury, or new decreased concentration All other system neg per pt    Objective:   Physical Exam BP 116/68   Pulse 73   Temp 97.7 F (36.5 C) (Oral)   Ht 5\' 9"  (1.753 m)   Wt 168 lb (76.2 kg)   SpO2  97%   BMI 24.81 kg/m  VS noted,  Constitutional: Pt is oriented to person, place, and time. Appears well-developed and well-nourished, in no significant distress and comfortable Head: Normocephalic and atraumatic  Eyes: Conjunctivae and EOM are normal. Pupils are equal, round, and reactive to light Right Ear: External ear normal without discharge Left Ear: External ear normal without discharge Nose: Nose without discharge or deformity Mouth/Throat: Oropharynx is without other ulcerations and moist  Neck: Normal range of motion. Neck supple. No JVD present. No tracheal deviation present or significant neck LA or mass Cardiovascular: Normal rate, regular rhythm, normal heart sounds and intact distal pulses.   Pulmonary/Chest: WOB normal and breath sounds without rales or wheezing  Abdominal: Soft. Bowel sounds are normal. NT. No HSM  Musculoskeletal: Normal range of motion. Exhibits no edema, right knee with 5 degrees less full extension with trace warmth and effusion Lymphadenopathy: Has no other cervical adenopathy.  Neurological: Pt is alert and oriented to person, place, and time. Pt has normal reflexes. No cranial nerve deficit. Motor grossly intact, Gait intact Skin: Skin is warm and dry. No rash noted or new ulcerations Psychiatric:  Has normal mood and affect. Behavior is normal without agitation No other exam findings Lab Results  Component Value Date   WBC 12.8 (H) 01/24/2018   HGB 12.5 (L) 01/24/2018   HCT 37.4 (L) 01/24/2018   PLT 192 01/24/2018   GLUCOSE 101 (H) 01/24/2018   CHOL 153 07/11/2017   TRIG 59.0 07/11/2017   HDL 49.00 07/11/2017   LDLCALC 92 07/11/2017   ALT 15 07/11/2017   AST 27 07/11/2017   NA 138 01/24/2018   K 3.9 01/24/2018   CL 106 01/24/2018   CREATININE 0.80 01/24/2018   BUN 10 01/24/2018   CO2 22 01/24/2018   TSH 2.82 07/11/2017   PSA 1.96 07/11/2017       Assessment & Plan:

## 2018-07-16 NOTE — Addendum Note (Signed)
Addended by: Juliet Rude on: 07/16/2018 01:47 PM   Modules accepted: Orders

## 2018-07-16 NOTE — Patient Instructions (Signed)

## 2018-07-18 DIAGNOSIS — M25661 Stiffness of right knee, not elsewhere classified: Secondary | ICD-10-CM | POA: Diagnosis not present

## 2018-07-24 DIAGNOSIS — Z96651 Presence of right artificial knee joint: Secondary | ICD-10-CM | POA: Diagnosis not present

## 2018-08-20 DIAGNOSIS — H31091 Other chorioretinal scars, right eye: Secondary | ICD-10-CM | POA: Diagnosis not present

## 2018-08-20 DIAGNOSIS — H02834 Dermatochalasis of left upper eyelid: Secondary | ICD-10-CM | POA: Diagnosis not present

## 2018-08-20 DIAGNOSIS — H02831 Dermatochalasis of right upper eyelid: Secondary | ICD-10-CM | POA: Diagnosis not present

## 2018-08-20 DIAGNOSIS — H26493 Other secondary cataract, bilateral: Secondary | ICD-10-CM | POA: Diagnosis not present

## 2018-08-23 ENCOUNTER — Encounter (INDEPENDENT_AMBULATORY_CARE_PROVIDER_SITE_OTHER): Payer: Medicare Other | Admitting: Ophthalmology

## 2018-08-23 DIAGNOSIS — I10 Essential (primary) hypertension: Secondary | ICD-10-CM

## 2018-08-23 DIAGNOSIS — H35033 Hypertensive retinopathy, bilateral: Secondary | ICD-10-CM | POA: Diagnosis not present

## 2018-08-23 DIAGNOSIS — H33301 Unspecified retinal break, right eye: Secondary | ICD-10-CM

## 2018-08-30 DIAGNOSIS — Z23 Encounter for immunization: Secondary | ICD-10-CM | POA: Diagnosis not present

## 2018-09-06 ENCOUNTER — Encounter (INDEPENDENT_AMBULATORY_CARE_PROVIDER_SITE_OTHER): Payer: Medicare Other | Admitting: Ophthalmology

## 2018-09-06 DIAGNOSIS — H33301 Unspecified retinal break, right eye: Secondary | ICD-10-CM

## 2018-11-12 DIAGNOSIS — L723 Sebaceous cyst: Secondary | ICD-10-CM | POA: Diagnosis not present

## 2018-11-12 DIAGNOSIS — Z85820 Personal history of malignant melanoma of skin: Secondary | ICD-10-CM | POA: Diagnosis not present

## 2018-11-12 DIAGNOSIS — D1801 Hemangioma of skin and subcutaneous tissue: Secondary | ICD-10-CM | POA: Diagnosis not present

## 2018-11-12 DIAGNOSIS — L812 Freckles: Secondary | ICD-10-CM | POA: Diagnosis not present

## 2018-11-12 DIAGNOSIS — L821 Other seborrheic keratosis: Secondary | ICD-10-CM | POA: Diagnosis not present

## 2018-12-24 DIAGNOSIS — M7541 Impingement syndrome of right shoulder: Secondary | ICD-10-CM | POA: Diagnosis not present

## 2018-12-24 DIAGNOSIS — M25511 Pain in right shoulder: Secondary | ICD-10-CM | POA: Diagnosis not present

## 2019-01-10 ENCOUNTER — Encounter (INDEPENDENT_AMBULATORY_CARE_PROVIDER_SITE_OTHER): Payer: Medicare Other | Admitting: Ophthalmology

## 2019-01-10 DIAGNOSIS — H33301 Unspecified retinal break, right eye: Secondary | ICD-10-CM | POA: Diagnosis not present

## 2019-01-10 DIAGNOSIS — I10 Essential (primary) hypertension: Secondary | ICD-10-CM | POA: Diagnosis not present

## 2019-01-10 DIAGNOSIS — H35033 Hypertensive retinopathy, bilateral: Secondary | ICD-10-CM

## 2019-01-11 ENCOUNTER — Ambulatory Visit (INDEPENDENT_AMBULATORY_CARE_PROVIDER_SITE_OTHER): Payer: Medicare Other | Admitting: *Deleted

## 2019-01-11 VITALS — BP 117/72 | HR 82 | Resp 18 | Ht 69.0 in | Wt 177.0 lb

## 2019-01-11 DIAGNOSIS — Z0000 Encounter for general adult medical examination without abnormal findings: Secondary | ICD-10-CM | POA: Diagnosis not present

## 2019-01-11 NOTE — Patient Instructions (Addendum)
Continue doing brain stimulating activities (puzzles, reading, adult coloring books, staying active) to keep memory sharp.   Continue to eat heart healthy diet (full of fruits, vegetables, whole grains, lean protein, water--limit salt, fat, and sugar intake) and increase physical activity as tolerated.  Alexander Castillo , Thank you for taking time to come for your Medicare Wellness Visit. I appreciate your ongoing commitment to your health goals. Please review the following plan we discussed and let me know if I can assist you in the future.   These are the goals we discussed: Goals    . Patient Stated     Maintain current health status, stay as independent as possible.       This is a list of the screening recommended for you and due dates:  Health Maintenance  Topic Date Due  . Flu Shot  03/17/2019*  . Tetanus Vaccine  07/16/2028  . Pneumonia vaccines  Completed  *Topic was postponed. The date shown is not the original due date.    Preventive Care 82 Years and Older, Male Preventive care refers to lifestyle choices and visits with your health care provider that can promote health and wellness. What does preventive care include?   A yearly physical exam. This is also called an annual well check.  Dental exams once or twice a year.  Routine eye exams. Ask your health care provider how often you should have your eyes checked.  Personal lifestyle choices, including: ? Daily care of your teeth and gums. ? Regular physical activity. ? Eating a healthy diet. ? Avoiding tobacco and drug use. ? Limiting alcohol use. ? Practicing safe sex. ? Taking low doses of aspirin every day. ? Taking vitamin and mineral supplements as recommended by your health care provider. What happens during an annual well check? The services and screenings done by your health care provider during your annual well check will depend on your age, overall health, lifestyle risk factors, and family history of  disease. Counseling Your health care provider may ask you questions about your:  Alcohol use.  Tobacco use.  Drug use.  Emotional well-being.  Home and relationship well-being.  Sexual activity.  Eating habits.  History of falls.  Memory and ability to understand (cognition).  Work and work Statistician. Screening You may have the following tests or measurements:  Height, weight, and BMI.  Blood pressure.  Lipid and cholesterol levels. These may be checked every 5 years, or more frequently if you are over 27 years old.  Skin check.  Lung cancer screening. You may have this screening every year starting at age 45 if you have a 30-pack-year history of smoking and currently smoke or have quit within the past 15 years.  Colorectal cancer screening. All adults should have this screening starting at age 6 and continuing until age 27. You will have tests every 1-10 years, depending on your results and the type of screening test. People at increased risk should start screening at an earlier age. Screening tests may include: ? Guaiac-based fecal occult blood testing. ? Fecal immunochemical test (FIT). ? Stool DNA test. ? Virtual colonoscopy. ? Sigmoidoscopy. During this test, a flexible tube with a tiny camera (sigmoidoscope) is used to examine your rectum and lower colon. The sigmoidoscope is inserted through your anus into your rectum and lower colon. ? Colonoscopy. During this test, a long, thin, flexible tube with a tiny camera (colonoscope) is used to examine your entire colon and rectum.  Prostate cancer screening. Recommendations  will vary depending on your family history and other risks.  Hepatitis C blood test.  Hepatitis B blood test.  Sexually transmitted disease (STD) testing.  Diabetes screening. This is done by checking your blood sugar (glucose) after you have not eaten for a while (fasting). You may have this done every 1-3 years.  Abdominal aortic  aneurysm (AAA) screening. You may need this if you are a current or former smoker.  Osteoporosis. You may be screened starting at age 57 if you are at high risk. Talk with your health care provider about your test results, treatment options, and if necessary, the need for more tests. Vaccines Your health care provider may recommend certain vaccines, such as:  Influenza vaccine. This is recommended every year.  Tetanus, diphtheria, and acellular pertussis (Tdap, Td) vaccine. You may need a Td booster every 10 years.  Varicella vaccine. You may need this if you have not been vaccinated.  Zoster vaccine. You may need this after age 43.  Measles, mumps, and rubella (MMR) vaccine. You may need at least one dose of MMR if you were born in 1957 or later. You may also need a second dose.  Pneumococcal 13-valent conjugate (PCV13) vaccine. One dose is recommended after age 28.  Pneumococcal polysaccharide (PPSV23) vaccine. One dose is recommended after age 1.  Meningococcal vaccine. You may need this if you have certain conditions.  Hepatitis A vaccine. You may need this if you have certain conditions or if you travel or work in places where you may be exposed to hepatitis A.  Hepatitis B vaccine. You may need this if you have certain conditions or if you travel or work in places where you may be exposed to hepatitis B.  Haemophilus influenzae type b (Hib) vaccine. You may need this if you have certain risk factors. Talk to your health care provider about which screenings and vaccines you need and how often you need them. This information is not intended to replace advice given to you by your health care provider. Make sure you discuss any questions you have with your health care provider. Document Released: 12/25/2015 Document Revised: 01/18/2018 Document Reviewed: 09/29/2015 Elsevier Interactive Patient Education  2019 [employer].

## 2019-01-11 NOTE — Progress Notes (Addendum)
Subjective:   Alexander Castillo is a 78 y.o. male who presents for an Initial Medicare Annual Wellness Visit.  Review of Systems  No ROS.  Medicare Wellness Visit. Additional risk factors are reflected in the social history.  Cardiac Risk Factors include: advanced age (>24men, >25 women);male gender;hypertension;dyslipidemia Sleep patterns: feels rested on waking, does not get up to void and sleeps 7 hours nightly.    Home Safety/Smoke Alarms: Feels safe in home. Smoke alarms in place.  Living environment; residence and Firearm Safety: 1-story house/ trailer. Lives alone, no needs for DME, good support system Seat Belt Safety/Bike Helmet: Wears seat belt.   PSA-  Lab Results  Component Value Date   PSA 1.83 07/16/2018   PSA 1.96 07/11/2017   PSA 3.73 08/11/2016      Objective:    Today's Vitals   01/11/19 1303 01/11/19 1338  BP: 117/72   Pulse: 82   Resp: 18   SpO2: 99%   Weight: 177 lb (80.3 kg)   Height: 5\' 9"  (1.753 m)   PainSc:  2    Body mass index is 26.14 kg/m.  Advanced Directives 01/11/2019 01/23/2018 01/23/2018 01/17/2018 06/13/2016 06/13/2016 06/02/2016  Does Patient Have a Medical Advance Directive? Yes Yes Yes Yes Yes Yes Yes  Type of Paramedic of [place];Living will Living will Living will Living will [place];Living will [place];Living will [place];Living will  Does patient want to make changes to medical advance directive? - No - Patient declined No - Patient declined No - Patient declined - No - Patient declined No - Patient declined  Copy of [place] in Chart? No - copy requested - - - No - copy requested No - copy requested No - copy requested    Current Medications (verified) No outpatient encounter medications on file as of 01/11/2019.   No facility-administered encounter medications on file as of 01/11/2019.     Allergies (verified) Patient has  no known allergies.   History: Past Medical History:  Diagnosis Date  . BENIGN PROSTATIC HYPERTROPHY 03/10/2008  . BENIGN PROSTATIC HYPERTROPHY, HX OF 03/10/2008  . CHEST PAIN 03/10/2008   at pre-op 01-17-18: per patient had cardiac eval at that time was sent for test (uaware type) ; reports nothing was abnormal; no recurrence of chest pain   . DEGENERATIVE JOINT DISEASE 03/10/2008  . EAR PAIN, LEFT 12/09/2008  . HYPERLIPIDEMIA 03/10/2008  . PSA, INCREASED 03/16/2009  . RASH-NONVESICULAR 03/16/2009   Past Surgical History:  Procedure Laterality Date  . back surgury  1998   lower  . EYE SURGERY Bilateral    lens replacement for cataracts  . hip replacement left Left 2002  . s/p left knee arthroscopy  yrs ago  . TOTAL HIP ARTHROPLASTY Right 06/13/2016   Procedure: RIGHT TOTAL HIP ARTHROPLASTY ANTERIOR APPROACH;  Surgeon: Paralee Cancel, MD;  Location: [facility];  Service: Orthopedics;  Laterality: Right;  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  . TOTAL KNEE ARTHROPLASTY Right 01/23/2018   Procedure: RIGHT TOTAL KNEE ARTHROPLASTY;  Surgeon: Paralee Cancel, MD;  Location: [facility];  Service: Orthopedics;  Laterality: Right;  70 mins  . transurethral resection of prostate     Family History  Problem Relation Age of Onset  . Dementia Mother    Social History   Socioeconomic History  . Marital status: Widowed    Spouse name: Not on file  . Number of children: 2  . Years of education:  Not on file  . Highest education level: Not on file  Occupational History  . Occupation: retired Therapist, music  . Financial resource strain: Not hard at all  . Food insecurity:    Worry: Never true    Inability: Never true  . Transportation needs:    Medical: No    Non-medical: No  Tobacco Use  . Smoking status: Never Smoker  . Smokeless tobacco: Former Network engineer and Sexual Activity  . Alcohol use: No  . Drug use: No  . Sexual activity: Not Currently  Lifestyle  . Physical activity:    Days per week:  4 days    Minutes per session: 50 min  . Stress: Not at all  Relationships  . Social connections:    Talks on phone: More than three times a week    Gets together: More than three times a week    Attends religious service: 1 to 4 times per year    Active member of club or organization: Yes    Attends meetings of clubs or organizations: 1 to 4 times per year    Relationship status: Widowed  Other Topics Concern  . Not on file  Social History Narrative  . Not on file   Tobacco Counseling Counseling given: Not Answered  Activities of Daily Living In your present state of health, do you have any difficulty performing the following activities: 01/11/2019 01/23/2018  Hearing? N N  Vision? N N  Difficulty concentrating or making decisions? N N  Walking or climbing stairs? N N  Dressing or bathing? N N  Doing errands, shopping? N N  Preparing Food and eating ? N -  Using the Toilet? N -  In the past six months, have you accidently leaked urine? N -  Do you have problems with loss of bowel control? N -  Managing your Medications? N -  Managing your Finances? N -  Housekeeping or managing your Housekeeping? N -  Some recent data might be hidden     Immunizations and Health Maintenance Immunization History  Administered Date(s) Administered  . Influenza Whole 09/11/2008, 09/11/2009  . Influenza,inj,Quad PF,6+ Mos 09/11/2014  . Pneumococcal Conjugate-13 07/03/2014  . Pneumococcal Polysaccharide-23 03/12/2004, 03/16/2009  . Td 12/12/2004, 12/13/2007  . Tdap 07/16/2018  . Zoster 06/18/2012   There are no preventive care reminders to display for this patient.  Patient Care Team: Biagio Borg, MD as PCP - General  Indicate any recent Medical Services you may have received from other than [facility] providers in the past year (date may be approximate).    Assessment:   This is a routine wellness examination for [place]. Physical assessment deferred to PCP.  Hearing/Vision  screen Hearing Screening Comments: Able to hear conversational tones w/o difficulty. No issues reported.  Passed whisper test  Vision Screening Comments: appointment every 6 months Dr. Zigmund Daniel Dr. Posey Pronto  Dietary issues and exercise activities discussed: Current Exercise Habits: Structured exercise class, Type of exercise: walking;strength training/weights(eliptical), Time (Minutes): 40, Frequency (Times/Week): 4, Weekly Exercise (Minutes/Week): 160, Intensity: Mild, Exercise limited by: orthopedic condition(s)  Diet (meal preparation, eat out, water intake, caffeinated beverages, dairy products, fruits and vegetables): in general, a "healthy" diet  , well balanced   Reviewed heart healthy diet. Encouraged patient to increase daily water and healthy fluid intake.  Goals    . Patient Stated     Maintain current health status, stay as independent as possible.      Depression  Screen PHQ 2/9 Scores 01/11/2019 07/16/2018 07/07/2015 07/03/2014  PHQ - 2 Score 0 0 0 0  PHQ- 9 Score 0 0 - -    Fall Risk Fall Risk  01/11/2019 07/16/2018 07/13/2017 07/07/2015 07/03/2014  Falls in the past year? 0 No No No No  Cognitive Function: MMSE - Mini Mental State Exam 01/11/2019  Orientation to time 5  Orientation to Place 5  Registration 3  Attention/ Calculation 5  Recall 1  Language- name 2 objects 2  Language- repeat 1  Language- follow 3 step command 3  Language- read & follow direction 1  Write a sentence 1  Copy design 1  Total score 28        Screening Tests Health Maintenance  Topic Date Due  . INFLUENZA VACCINE  03/17/2019 (Originally 07/12/2018)  . TETANUS/TDAP  07/16/2028  . PNA vac Low Risk Adult  Completed      Plan:      Reviewed health maintenance screenings with patient today and relevant education, vaccines, and/or referrals were provided.   Continue doing brain stimulating activities (puzzles, reading, adult coloring books, staying active) to keep memory sharp.   Continue  to eat heart healthy diet (full of fruits, vegetables, whole grains, lean protein, water--limit salt, fat, and sugar intake) and increase physical activity as tolerated.  I have personally reviewed and noted the following in the patient's chart:   . Medical and social history . Use of alcohol, tobacco or illicit drugs  . Current medications and supplements . Functional ability and status . Nutritional status . Physical activity . Advanced directives . List of other physicians . Vitals . Screenings to include cognitive, depression, and falls . Referrals and appointments  In addition, I have reviewed and discussed with patient certain preventive protocols, quality metrics, and best practice recommendations. A written personalized care plan for preventive services as well as general preventive health recommendations were provided to patient.     Michiel Cowboy, RN   01/11/2019      Medical screening examination/treatment/procedure(s) were performed by non-physician practitioner and as supervising physician I was immediately available for consultation/collaboration. I agree with above. Cathlean Cower, MD

## 2019-04-18 ENCOUNTER — Encounter: Payer: Self-pay | Admitting: Gastroenterology

## 2019-07-10 ENCOUNTER — Telehealth: Payer: Self-pay | Admitting: Internal Medicine

## 2019-07-10 DIAGNOSIS — Z0000 Encounter for general adult medical examination without abnormal findings: Secondary | ICD-10-CM

## 2019-07-10 NOTE — Telephone Encounter (Signed)
Patient has a physical appt on 07/18/2019 and he said Dr. Jenny Reichmann requested that he has his labs prior to the appt. So he will need order in Epic to have it done before the appt.

## 2019-07-12 ENCOUNTER — Other Ambulatory Visit (INDEPENDENT_AMBULATORY_CARE_PROVIDER_SITE_OTHER): Payer: Medicare Other

## 2019-07-12 DIAGNOSIS — Z0000 Encounter for general adult medical examination without abnormal findings: Secondary | ICD-10-CM | POA: Diagnosis not present

## 2019-07-12 LAB — BASIC METABOLIC PANEL
BUN: 16 mg/dL (ref 6–23)
CO2: 28 mEq/L (ref 19–32)
Calcium: 9.5 mg/dL (ref 8.4–10.5)
Chloride: 107 mEq/L (ref 96–112)
Creatinine, Ser: 0.84 mg/dL (ref 0.40–1.50)
GFR: 87.82 mL/min (ref >60.00)
Glucose, Bld: 90 mg/dL (ref 70–99)
Potassium: 4.2 mEq/L (ref 3.5–5.1)
Sodium: 143 mEq/L (ref 135–145)

## 2019-07-12 LAB — LIPID PANEL
Cholesterol: 156 mg/dL (ref 0–200)
HDL: 50.9 mg/dL (ref >39.00)
LDL Cholesterol: 92 mg/dL (ref 0–99)
NonHDL: 104.66
Total CHOL/HDL Ratio: 3
Triglycerides: 65 mg/dL (ref 0.0–149.0)
VLDL: 13 mg/dL (ref 0.0–40.0)

## 2019-07-12 LAB — CBC WITH DIFFERENTIAL/PLATELET
Basophils Absolute: 0 10*3/uL (ref 0.0–0.1)
Basophils Relative: 1.1 % (ref 0.0–3.0)
Eosinophils Absolute: 0.1 10*3/uL (ref 0.0–0.7)
Eosinophils Relative: 2.2 % (ref 0.0–5.0)
HCT: 46.2 % (ref 39.0–52.0)
Hemoglobin: 15.1 g/dL (ref 13.0–17.0)
Lymphocytes Relative: 29.1 % (ref 12.0–46.0)
Lymphs Abs: 1.3 10*3/uL (ref 0.7–4.0)
MCHC: 32.7 g/dL (ref 30.0–36.0)
MCV: 93 fl (ref 78.0–100.0)
Monocytes Absolute: 0.4 10*3/uL (ref 0.1–1.0)
Monocytes Relative: 10 % (ref 3.0–12.0)
Neutro Abs: 2.6 10*3/uL (ref 1.4–7.7)
Neutrophils Relative %: 57.6 % (ref 43.0–77.0)
Platelets: 229 10*3/uL (ref 150.0–400.0)
RBC: 4.97 Mil/uL (ref 4.22–5.81)
RDW: 14 % (ref 11.5–15.5)
WBC: 4.5 10*3/uL (ref 4.0–10.5)

## 2019-07-12 LAB — HEPATIC FUNCTION PANEL
ALT: 19 U/L (ref 0–53)
AST: 29 U/L (ref 0–37)
Albumin: 4.4 g/dL (ref 3.5–5.2)
Alkaline Phosphatase: 49 U/L (ref 39–117)
Bilirubin, Direct: 0.2 mg/dL (ref 0.0–0.3)
Total Bilirubin: 0.6 mg/dL (ref 0.2–1.2)
Total Protein: 6.5 g/dL (ref 6.0–8.3)

## 2019-07-12 LAB — URINALYSIS, ROUTINE W REFLEX MICROSCOPIC
Bilirubin Urine: NEGATIVE
Hgb urine dipstick: NEGATIVE
Ketones, ur: NEGATIVE
Leukocytes,Ua: NEGATIVE
Nitrite: NEGATIVE
RBC / HPF: NONE SEEN (ref 0–?)
Specific Gravity, Urine: 1.025 (ref 1.000–1.030)
Total Protein, Urine: NEGATIVE
Urine Glucose: NEGATIVE
Urobilinogen, UA: 0.2 (ref 0.0–1.0)
pH: 6.5 (ref 5.0–8.0)

## 2019-07-12 LAB — TSH: TSH: 2.66 u[IU]/mL (ref 0.35–4.50)

## 2019-07-12 LAB — PSA: PSA: 1.95 ng/mL (ref 0.10–4.00)

## 2019-07-18 ENCOUNTER — Encounter: Payer: Self-pay | Admitting: Internal Medicine

## 2019-07-18 ENCOUNTER — Ambulatory Visit (INDEPENDENT_AMBULATORY_CARE_PROVIDER_SITE_OTHER): Payer: Medicare Other | Admitting: Internal Medicine

## 2019-07-18 ENCOUNTER — Other Ambulatory Visit: Payer: Self-pay

## 2019-07-18 VITALS — BP 128/74 | HR 88 | Temp 98.0°F | Ht 69.0 in | Wt 167.0 lb

## 2019-07-18 DIAGNOSIS — Z0000 Encounter for general adult medical examination without abnormal findings: Secondary | ICD-10-CM

## 2019-07-18 NOTE — Patient Instructions (Signed)
Please continue all other medications as before, and refills have been done if requested.  Please have the pharmacy call with any other refills you may need.  Please continue your efforts at being more active, low cholesterol diet, and weight control.  You are otherwise up to date with prevention measures today.  Please keep your appointments with your specialists as you may have planned  Please return in 1 year for your yearly visit, or sooner if needed, with Lab testing done 3-5 days before  

## 2019-07-18 NOTE — Assessment & Plan Note (Signed)

## 2019-07-18 NOTE — Progress Notes (Signed)
Subjective:    Patient ID: Alexander Castillo, male    DOB: 07-Dec-1941, 78 y.o.   MRN: [id number]  HPI Here for wellness and f/u;  Overall doing ok;  Pt denies Chest pain, worsening SOB, DOE, wheezing, orthopnea, PND, worsening LE edema, palpitations, dizziness or syncope.  Pt denies neurological change such as new headache, facial or extremity weakness.  Pt denies polydipsia, polyuria, or low sugar symptoms. Pt states overall good compliance with treatment and medications, good tolerability, and has been trying to follow appropriate diet.  Pt denies worsening depressive symptoms, suicidal ideation or panic. No fever, night sweats, wt loss, loss of appetite, or other constitutional symptoms.  Pt states good ability with ADL's, has low fall risk, home safety reviewed and adequate, no other significant changes in hearing or vision, and only occasionally active with exercise. Plans for right shoulder cortisone next wk.  Has DJD pain to right knee and left ankle. Past Medical History:  Diagnosis Date  . BENIGN PROSTATIC HYPERTROPHY 03/10/2008  . BENIGN PROSTATIC HYPERTROPHY, HX OF 03/10/2008  . CHEST PAIN 03/10/2008   at pre-op 01-17-18: per patient had cardiac eval at that time was sent for test (uaware type) ; reports nothing was abnormal; no recurrence of chest pain   . DEGENERATIVE JOINT DISEASE 03/10/2008  . EAR PAIN, LEFT 12/09/2008  . HYPERLIPIDEMIA 03/10/2008  . PSA, INCREASED 03/16/2009  . RASH-NONVESICULAR 03/16/2009   Past Surgical History:  Procedure Laterality Date  . back surgury  1998   lower  . EYE SURGERY Bilateral    lens replacement for cataracts  . hip replacement left Left 2002  . s/p left knee arthroscopy  yrs ago  . TOTAL HIP ARTHROPLASTY Right 06/13/2016   Procedure: RIGHT TOTAL HIP ARTHROPLASTY ANTERIOR APPROACH;  Surgeon: Paralee Cancel, MD;  Location: [facility];  Service: Orthopedics;  Laterality: Right;  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  . TOTAL KNEE ARTHROPLASTY Right 01/23/2018   Procedure: RIGHT TOTAL KNEE ARTHROPLASTY;  Surgeon: Paralee Cancel, MD;  Location: [facility];  Service: Orthopedics;  Laterality: Right;  70 mins  . transurethral resection of prostate      reports that he has never smoked. He has quit using smokeless tobacco. He reports that he does not drink alcohol or use drugs. family history includes Dementia in his mother. No Known Allergies No current outpatient medications on file prior to visit.   No current facility-administered medications on file prior to visit.    Review of Systems Constitutional: Negative for other unusual diaphoresis, sweats, appetite or weight changes HENT: Negative for other worsening hearing loss, ear pain, facial swelling, mouth sores or neck stiffness.   Eyes: Negative for other worsening pain, redness or other visual disturbance.  Respiratory: Negative for other stridor or swelling Cardiovascular: Negative for other palpitations or other chest pain  Gastrointestinal: Negative for worsening diarrhea or loose stools, blood in stool, distention or other pain Genitourinary: Negative for hematuria, flank pain or other change in urine volume.  Musculoskeletal: Negative for myalgias or other joint swelling.  Skin: Negative for other color change, or other wound or worsening drainage.  Neurological: Negative for other syncope or numbness. Hematological: Negative for other adenopathy or swelling Psychiatric/Behavioral: Negative for hallucinations, other worsening agitation, SI, self-injury, or new decreased concentration All other system neg per pt    Objective:   Physical Exam BP 128/74   Pulse 88   Temp 98 F (36.7 C) (Oral)   Ht 5\' 9"  (1.753 m)   Wt 167 lb (  75.8 kg)   SpO2 97%   BMI 24.66 kg/m  VS noted,  Constitutional: Pt is oriented to person, place, and time. Appears well-developed and well-nourished, in no significant distress and comfortable Head: Normocephalic and atraumatic  Eyes: Conjunctivae and EOM are  normal. Pupils are equal, round, and reactive to light Right Ear: External ear normal without discharge Left Ear: External ear normal without discharge Nose: Nose without discharge or deformity Mouth/Throat: Oropharynx is without other ulcerations and moist  Neck: Normal range of motion. Neck supple. No JVD present. No tracheal deviation present or significant neck LA or mass Cardiovascular: Normal rate, regular rhythm, normal heart sounds and intact distal pulses.   Pulmonary/Chest: WOB normal and breath sounds without rales or wheezing  Abdominal: Soft. Bowel sounds are normal. NT. No HSM  Musculoskeletal: Normal range of motion. Exhibits no edema Lymphadenopathy: Has no other cervical adenopathy.  Neurological: Pt is alert and oriented to person, place, and time. Pt has normal reflexes. No cranial nerve deficit. Motor grossly intact, Gait intact Skin: Skin is warm and dry. No rash noted or new ulcerations Psychiatric:  Has normal mood and affect. Behavior is normal without agitation No other exam findings Lab Results  Component Value Date   WBC 4.5 07/12/2019   HGB 15.1 07/12/2019   HCT 46.2 07/12/2019   PLT 229.0 07/12/2019   GLUCOSE 90 07/12/2019   CHOL 156 07/12/2019   TRIG 65.0 07/12/2019   HDL 50.90 07/12/2019   LDLCALC 92 07/12/2019   ALT 19 07/12/2019   AST 29 07/12/2019   NA 143 07/12/2019   K 4.2 07/12/2019   CL 107 07/12/2019   CREATININE 0.84 07/12/2019   BUN 16 07/12/2019   CO2 28 07/12/2019   TSH 2.66 07/12/2019   PSA 1.95 07/12/2019       Assessment & Plan:

## 2019-07-29 DIAGNOSIS — M7541 Impingement syndrome of right shoulder: Secondary | ICD-10-CM | POA: Insufficient documentation

## 2019-07-30 DIAGNOSIS — M7541 Impingement syndrome of right shoulder: Secondary | ICD-10-CM | POA: Diagnosis not present

## 2019-08-26 ENCOUNTER — Other Ambulatory Visit: Payer: Self-pay

## 2019-08-26 DIAGNOSIS — R6889 Other general symptoms and signs: Secondary | ICD-10-CM | POA: Diagnosis not present

## 2019-08-26 DIAGNOSIS — Z20822 Contact with and (suspected) exposure to covid-19: Secondary | ICD-10-CM

## 2019-08-27 LAB — NOVEL CORONAVIRUS, NAA: SARS-CoV-2, NAA: NOT DETECTED

## 2019-11-11 ENCOUNTER — Other Ambulatory Visit: Payer: Self-pay

## 2019-11-11 DIAGNOSIS — Z20822 Contact with and (suspected) exposure to covid-19: Secondary | ICD-10-CM

## 2019-11-12 LAB — NOVEL CORONAVIRUS, NAA: SARS-CoV-2, NAA: NOT DETECTED

## 2019-11-13 ENCOUNTER — Other Ambulatory Visit: Payer: Self-pay

## 2019-11-13 ENCOUNTER — Other Ambulatory Visit (INDEPENDENT_AMBULATORY_CARE_PROVIDER_SITE_OTHER): Payer: Medicare Other

## 2019-11-13 ENCOUNTER — Encounter: Payer: Self-pay | Admitting: Internal Medicine

## 2019-11-13 ENCOUNTER — Ambulatory Visit (INDEPENDENT_AMBULATORY_CARE_PROVIDER_SITE_OTHER): Payer: Medicare Other | Admitting: Internal Medicine

## 2019-11-13 DIAGNOSIS — R3912 Poor urinary stream: Secondary | ICD-10-CM

## 2019-11-13 DIAGNOSIS — F329 Major depressive disorder, single episode, unspecified: Secondary | ICD-10-CM | POA: Diagnosis not present

## 2019-11-13 DIAGNOSIS — N401 Enlarged prostate with lower urinary tract symptoms: Secondary | ICD-10-CM | POA: Diagnosis not present

## 2019-11-13 DIAGNOSIS — R300 Dysuria: Secondary | ICD-10-CM

## 2019-11-13 DIAGNOSIS — F32A Depression, unspecified: Secondary | ICD-10-CM

## 2019-11-13 LAB — URINALYSIS, ROUTINE W REFLEX MICROSCOPIC
Bilirubin Urine: NEGATIVE
Ketones, ur: NEGATIVE
Nitrite: NEGATIVE
Specific Gravity, Urine: 1.015 (ref 1.000–1.030)
Total Protein, Urine: 30 — AB
Urine Glucose: NEGATIVE
Urobilinogen, UA: 1 (ref 0.0–1.0)
pH: 7.5 (ref 5.0–8.0)

## 2019-11-13 MED ORDER — CIPROFLOXACIN HCL 500 MG PO TABS: 500.0000 mg | ORAL_TABLET | Freq: Two times a day (BID) | ORAL | 0 refills | Status: AC

## 2019-11-13 MED ORDER — CEFTRIAXONE SODIUM 1 G IJ SOLR
1.0000 g | Freq: Once | INTRAMUSCULAR | Status: AC
  Administered 2019-11-13: 1 g via INTRAMUSCULAR

## 2019-11-13 NOTE — Progress Notes (Signed)
Subjective:    Patient ID: Alexander Castillo, male    DOB: [birth year], 78 y.o.   MRN: [id number]  HPI  Her with c/o 3 days onset urinary pain on urination with frequency with small amounts, but Denies urinary symptoms such as urgency, flank pain, hematuria or n/v, fever, chills.  Has felt warm and fatigued, and once had a temp to 101 at home.  COVID negative the day prior to this exam.  Pt denies chest pain, increased sob or doe, wheezing, orthopnea, PND, increased LE swelling, palpitations, dizziness or syncope.  Pt denies new neurological symptoms such as new headache, or facial or extremity weakness or numbness   Pt denies polydipsia, polyuria.  Denies worsening depressive symptoms, suicidal ideation, or panic Past Medical History:  Diagnosis Date  . BENIGN PROSTATIC HYPERTROPHY 03/10/2008  . BENIGN PROSTATIC HYPERTROPHY, HX OF 03/10/2008  . CHEST PAIN 03/10/2008   at pre-op 01-17-18: per patient had cardiac eval at that time was sent for test (uaware type) ; reports nothing was abnormal; no recurrence of chest pain   . DEGENERATIVE JOINT DISEASE 03/10/2008  . EAR PAIN, LEFT 12/09/2008  . HYPERLIPIDEMIA 03/10/2008  . PSA, INCREASED 03/16/2009  . RASH-NONVESICULAR 03/16/2009   Past Surgical History:  Procedure Laterality Date  . back surgury  1998   lower  . EYE SURGERY Bilateral    lens replacement for cataracts  . hip replacement left Left 2002  . s/p left knee arthroscopy  yrs ago  . TOTAL HIP ARTHROPLASTY Right 06/13/2016   Procedure: RIGHT TOTAL HIP ARTHROPLASTY ANTERIOR APPROACH;  Surgeon: Paralee Cancel, MD;  Location: [facility];  Service: Orthopedics;  Laterality: Right;  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  . TOTAL KNEE ARTHROPLASTY Right 01/23/2018   Procedure: RIGHT TOTAL KNEE ARTHROPLASTY;  Surgeon: Paralee Cancel, MD;  Location: [facility];  Service: Orthopedics;  Laterality: Right;  70 mins  . transurethral resection of prostate      reports that he has never smoked. He has quit using smokeless  tobacco. He reports that he does not drink alcohol or use drugs. family history includes Dementia in his mother. No Known Allergies No current outpatient medications on file prior to visit.   No current facility-administered medications on file prior to visit.    Review of Systems  Constitutional: Negative for other unusual diaphoresis or sweats HENT: Negative for ear discharge or swelling Eyes: Negative for other worsening visual disturbances Respiratory: Negative for stridor or other swelling  Gastrointestinal: Negative for worsening distension or other blood Genitourinary: Negative for retention or other urinary change Musculoskeletal: Negative for other MSK pain or swelling Skin: Negative for color change or other new lesions Neurological: Negative for worsening tremors and other numbness  Psychiatric/Behavioral: Negative for worsening agitation or other fatigue All otherwise neg per pt    Objective:   Physical Exam BP (!) 142/84   Pulse 86   Temp 98.2 F (36.8 C) (Oral)   Ht 5\' 9"  (1.753 m)   Wt 180 lb (81.6 kg)   SpO2 98%   BMI 26.58 kg/m  VS noted,  Constitutional: Pt appears in NAD HENT: Head: NCAT.  Right Ear: External ear normal.  Left Ear: External ear normal.  Eyes: . Pupils are equal, round, and reactive to light. Conjunctivae and EOM are normal Nose: without d/c or deformity Neck: Neck supple. Gross normal ROM Cardiovascular: Normal rate and regular rhythm.   Pulmonary/Chest: Effort normal and breath sounds without rales or wheezing.  Abd:  Soft, NT, ND, +  BS, no organomegaly Neurological: Pt is alert. At baseline orientation, motor grossly intact Skin: Skin is warm. No rashes, other new lesions, no LE edema Psychiatric: Pt behavior is normal without agitation  All otherwise neg per pt Lab Results  Component Value Date   WBC 4.5 07/12/2019   HGB 15.1 07/12/2019   HCT 46.2 07/12/2019   PLT 229.0 07/12/2019   GLUCOSE 90 07/12/2019   CHOL 156 07/12/2019    TRIG 65.0 07/12/2019   HDL 50.90 07/12/2019   LDLCALC 92 07/12/2019   ALT 19 07/12/2019   AST 29 07/12/2019   NA 143 07/12/2019   K 4.2 07/12/2019   CL 107 07/12/2019   CREATININE 0.84 07/12/2019   BUN 16 07/12/2019   CO2 28 07/12/2019   TSH 2.66 07/12/2019   PSA 1.95 07/12/2019      Assessment & Plan:

## 2019-11-13 NOTE — Patient Instructions (Signed)
You had the antibiotic shot today (rocephin)  Please take all new medication as prescribed - the cipro (pill antibiotic)  Please continue all other medications as before, and refills have been done if requested.  Please have the pharmacy call with any other refills you may need.  Please continue your efforts at being more active, low cholesterol diet, and weight control.  Please keep your appointments with your specialists as you may have planned  Please go to the LAB in the Basement (turn left off the elevator) for the tests to be done today - just the urine testing today  You will be contacted by phone if any changes need to be made immediately.  Otherwise, you will receive a letter about your results with an explanation, but please check with MyChart first.  Please remember to sign up for MyChart if you have not done so, as this will be important to you in the future with finding out test results, communicating by private email, and scheduling acute appointments online when needed.  Please return in 1 year for your yearly visit, or sooner if needed, with Lab testing done 3-5 days before

## 2019-11-15 LAB — URINE CULTURE
MICRO NUMBER:: 1155567
SPECIMEN QUALITY:: ADEQUATE

## 2019-11-17 ENCOUNTER — Encounter: Payer: Self-pay | Admitting: Internal Medicine

## 2019-11-17 NOTE — Assessment & Plan Note (Signed)
stable overall by history and exam, recent data reviewed with pt, and pt to continue medical treatment as before,  to f/u any worsening symptoms or concerns  

## 2019-11-17 NOTE — Assessment & Plan Note (Signed)
High suspicion for UTI, for rocephin IM 1 gm, then cipro course asd,  to f/u any worsening symptoms or concerns

## 2019-11-18 DIAGNOSIS — L84 Corns and callosities: Secondary | ICD-10-CM | POA: Diagnosis not present

## 2019-11-18 DIAGNOSIS — L821 Other seborrheic keratosis: Secondary | ICD-10-CM | POA: Diagnosis not present

## 2019-11-18 DIAGNOSIS — L814 Other melanin hyperpigmentation: Secondary | ICD-10-CM | POA: Diagnosis not present

## 2019-11-18 DIAGNOSIS — L720 Epidermal cyst: Secondary | ICD-10-CM | POA: Diagnosis not present

## 2019-11-18 DIAGNOSIS — L818 Other specified disorders of pigmentation: Secondary | ICD-10-CM | POA: Diagnosis not present

## 2019-11-18 DIAGNOSIS — L812 Freckles: Secondary | ICD-10-CM | POA: Diagnosis not present

## 2019-12-03 DIAGNOSIS — H43811 Vitreous degeneration, right eye: Secondary | ICD-10-CM | POA: Diagnosis not present

## 2019-12-03 DIAGNOSIS — H02834 Dermatochalasis of left upper eyelid: Secondary | ICD-10-CM | POA: Diagnosis not present

## 2019-12-03 DIAGNOSIS — H02831 Dermatochalasis of right upper eyelid: Secondary | ICD-10-CM | POA: Diagnosis not present

## 2019-12-03 DIAGNOSIS — H26493 Other secondary cataract, bilateral: Secondary | ICD-10-CM | POA: Diagnosis not present

## 2019-12-28 ENCOUNTER — Ambulatory Visit: Payer: Medicare Other | Attending: Internal Medicine

## 2019-12-28 DIAGNOSIS — Z23 Encounter for immunization: Secondary | ICD-10-CM

## 2019-12-28 NOTE — Progress Notes (Signed)
   Covid-19 Vaccination Clinic  Name:  Alexander Castillo    MRN: [id number] DOB: [birth year]  12/28/2019  [name] was observed post Covid-19 immunization for 15 minutes without incidence. He was provided with Vaccine Information Sheet and instruction to access the V-Safe system.   [name] was instructed to call 911 with any severe reactions post vaccine: Marland Kitchen Difficulty breathing  . Swelling of your face and throat  . A fast heartbeat  . A bad rash all over your body  . Dizziness and weakness    Immunizations Administered    Name Date Dose VIS Date Route   Pfizer COVID-19 Vaccine 12/28/2019 12:34 PM 0.3 mL 11/22/2019 Intramuscular   Manufacturer: [place]   Lot: [id number]   [place]: [id number]

## 2020-01-14 DIAGNOSIS — M67431 Ganglion, right wrist: Secondary | ICD-10-CM | POA: Diagnosis not present

## 2020-01-14 DIAGNOSIS — M7541 Impingement syndrome of right shoulder: Secondary | ICD-10-CM | POA: Diagnosis not present

## 2020-01-14 DIAGNOSIS — M25511 Pain in right shoulder: Secondary | ICD-10-CM | POA: Diagnosis not present

## 2020-01-15 ENCOUNTER — Ambulatory Visit: Payer: Medicare Other

## 2020-01-16 ENCOUNTER — Ambulatory Visit: Payer: Medicare Other | Attending: Internal Medicine

## 2020-01-16 DIAGNOSIS — Z23 Encounter for immunization: Secondary | ICD-10-CM | POA: Insufficient documentation

## 2020-01-16 NOTE — Progress Notes (Signed)
   Covid-19 Vaccination Clinic  Name:  Cinque Rial    MRN: [id number] DOB: January 14, 1941  01/16/2020  Mr. Birch was observed post Covid-19 immunization for 15 minutes without incidence. He was provided with Vaccine Information Sheet and instruction to access the V-Safe system.   Mr. Gosha was instructed to call 911 with any severe reactions post vaccine: Marland Kitchen Difficulty breathing  . Swelling of your face and throat  . A fast heartbeat  . A bad rash all over your body  . Dizziness and weakness    Immunizations Administered    Name Date Dose VIS Date Route   Pfizer COVID-19 Vaccine 01/16/2020  8:54 AM 0.3 mL 11/22/2019 Intramuscular   Manufacturer: [place]   Lot: [id number]   [place]: [id number]

## 2020-01-23 DIAGNOSIS — M7541 Impingement syndrome of right shoulder: Secondary | ICD-10-CM | POA: Diagnosis not present

## 2020-01-29 DIAGNOSIS — M75121 Complete rotator cuff tear or rupture of right shoulder, not specified as traumatic: Secondary | ICD-10-CM | POA: Diagnosis not present

## 2020-02-06 DIAGNOSIS — M75120 Complete rotator cuff tear or rupture of unspecified shoulder, not specified as traumatic: Secondary | ICD-10-CM | POA: Insufficient documentation

## 2020-03-06 DIAGNOSIS — M79672 Pain in left foot: Secondary | ICD-10-CM | POA: Diagnosis not present

## 2020-03-06 DIAGNOSIS — M19072 Primary osteoarthritis, left ankle and foot: Secondary | ICD-10-CM | POA: Diagnosis not present

## 2020-03-06 DIAGNOSIS — M25572 Pain in left ankle and joints of left foot: Secondary | ICD-10-CM | POA: Diagnosis not present

## 2020-03-06 DIAGNOSIS — L84 Corns and callosities: Secondary | ICD-10-CM | POA: Diagnosis not present

## 2020-06-10 ENCOUNTER — Ambulatory Visit (INDEPENDENT_AMBULATORY_CARE_PROVIDER_SITE_OTHER): Payer: Medicare Other

## 2020-06-10 DIAGNOSIS — Z0000 Encounter for general adult medical examination without abnormal findings: Secondary | ICD-10-CM

## 2020-06-10 NOTE — Patient Instructions (Addendum)
Mr. Alexander Castillo , Thank you for taking time to come for your Medicare Wellness Visit. I appreciate your ongoing commitment to your health goals. Please review the following plan we discussed and let me know if I can assist you in the future.   Screening recommendations/referrals: Colonoscopy: no repeat due to age Recommended yearly ophthalmology/optometry visit for glaucoma screening and checkup Recommended yearly dental visit for hygiene and checkup  Vaccinations: Influenza vaccine: due 07/12/2020 Pneumococcal vaccine: completed Tdap vaccine: 07/16/2018; due every 10 years Shingles vaccine: never done   Covid-19: completed  Advanced directives: Please bring a copy of your health care power of attorney and living will to the office at your convenience.  Conditions/risks identified: Please continue to do your personal lifestyle choices by: daily care of teeth and gums, regular physical activity (goal should be 5 days a week for 30 minutes), eat a healthy diet, avoid tobacco and drug use, limiting any alcohol intake, taking a low-dose aspirin (if not allergic or have been advised by your provider otherwise) and taking vitamins and minerals as recommended by your provider. Continue doing brain stimulating activities (puzzles, reading, adult coloring books, staying active) to keep memory sharp. Continue to eat heart healthy diet (full of fruits, vegetables, whole grains, lean protein, water--limit salt, fat, and sugar intake) and increase physical activity as tolerated.  Next appointment: Please schedule your next Medicare Wellness Visit with your Nurse Health Advisor in 1 year.  Preventive Care 41 Years and Older, Male Preventive care refers to lifestyle choices and visits with your health care provider that can promote health and wellness. What does preventive care include?  A yearly physical exam. This is also called an annual well check.  Dental exams once or twice a year.  Routine eye exams.  Ask your health care provider how often you should have your eyes checked.  Personal lifestyle choices, including:  Daily care of your teeth and gums.  Regular physical activity.  Eating a healthy diet.  Avoiding tobacco and drug use.  Limiting alcohol use.  Practicing safe sex.  Taking low doses of aspirin every day.  Taking vitamin and mineral supplements as recommended by your health care provider. What happens during an annual well check? The services and screenings done by your health care provider during your annual well check will depend on your age, overall health, lifestyle risk factors, and family history of disease. Counseling  Your health care provider may ask you questions about your:  Alcohol use.  Tobacco use.  Drug use.  Emotional well-being.  Home and relationship well-being.  Sexual activity.  Eating habits.  History of falls.  Memory and ability to understand (cognition).  Work and work Statistician. Screening  You may have the following tests or measurements:  Height, weight, and BMI.  Blood pressure.  Lipid and cholesterol levels. These may be checked every 5 years, or more frequently if you are over 31 years old.  Skin check.  Lung cancer screening. You may have this screening every year starting at age 16 if you have a 30-pack-year history of smoking and currently smoke or have quit within the past 15 years.  Fecal occult blood test (FOBT) of the stool. You may have this test every year starting at age 82.  Flexible sigmoidoscopy or colonoscopy. You may have a sigmoidoscopy every 5 years or a colonoscopy every 10 years starting at age 54.  Prostate cancer screening. Recommendations will vary depending on your family history and other risks.  Hepatitis C  blood test.  Hepatitis B blood test.  Sexually transmitted disease (STD) testing.  Diabetes screening. This is done by checking your blood sugar (glucose) after you have not  eaten for a while (fasting). You may have this done every 1-3 years.  Abdominal aortic aneurysm (AAA) screening. You may need this if you are a current or former smoker.  Osteoporosis. You may be screened starting at age 76 if you are at high risk. Talk with your health care provider about your test results, treatment options, and if necessary, the need for more tests. Vaccines  Your health care provider may recommend certain vaccines, such as:  Influenza vaccine. This is recommended every year.  Tetanus, diphtheria, and acellular pertussis (Tdap, Td) vaccine. You may need a Td booster every 10 years.  Zoster vaccine. You may need this after age 46.  Pneumococcal 13-valent conjugate (PCV13) vaccine. One dose is recommended after age 22.  Pneumococcal polysaccharide (PPSV23) vaccine. One dose is recommended after age 37. Talk to your health care provider about which screenings and vaccines you need and how often you need them. This information is not intended to replace advice given to you by your health care provider. Make sure you discuss any questions you have with your health care provider. Document Released: 12/25/2015 Document Revised: 08/17/2016 Document Reviewed: 09/29/2015 Elsevier Interactive Patient Education  2017 [place] Prevention in the Home Falls can cause injuries. They can happen to people of all ages. There are many things you can do to make your home safe and to help prevent falls. What can I do on the outside of my home?  Regularly fix the edges of walkways and driveways and fix any cracks.  Remove anything that might make you trip as you walk through a door, such as a raised step or threshold.  Trim any bushes or trees on the path to your home.  Use bright outdoor lighting.  Clear any walking paths of anything that might make someone trip, such as rocks or tools.  Regularly check to see if handrails are loose or broken. Make sure that both sides  of any steps have handrails.  Any raised decks and porches should have guardrails on the edges.  Have any leaves, snow, or ice cleared regularly.  Use sand or salt on walking paths during winter.  Clean up any spills in your garage right away. This includes oil or grease spills. What can I do in the bathroom?  Use night lights.  Install grab bars by the toilet and in the tub and shower. Do not use towel bars as grab bars.  Use non-skid mats or decals in the tub or shower.  If you need to sit down in the shower, use a plastic, non-slip stool.  Keep the floor dry. Clean up any water that spills on the floor as soon as it happens.  Remove soap buildup in the tub or shower regularly.  Attach bath mats securely with double-sided non-slip rug tape.  Do not have throw rugs and other things on the floor that can make you trip. What can I do in the bedroom?  Use night lights.  Make sure that you have a light by your bed that is easy to reach.  Do not use any sheets or blankets that are too big for your bed. They should not hang down onto the floor.  Have a firm chair that has side arms. You can use this for support while you get dressed.  Do not have throw rugs and other things on the floor that can make you trip. What can I do in the kitchen?  Clean up any spills right away.  Avoid walking on wet floors.  Keep items that you use a lot in easy-to-reach places.  If you need to reach something above you, use a strong step stool that has a grab bar.  Keep electrical cords out of the way.  Do not use floor polish or wax that makes floors slippery. If you must use wax, use non-skid floor wax.  Do not have throw rugs and other things on the floor that can make you trip. What can I do with my stairs?  Do not leave any items on the stairs.  Make sure that there are handrails on both sides of the stairs and use them. Fix handrails that are broken or loose. Make sure that  handrails are as long as the stairways.  Check any carpeting to make sure that it is firmly attached to the stairs. Fix any carpet that is loose or worn.  Avoid having throw rugs at the top or bottom of the stairs. If you do have throw rugs, attach them to the floor with carpet tape.  Make sure that you have a light switch at the top of the stairs and the bottom of the stairs. If you do not have them, ask someone to add them for you. What else can I do to help prevent falls?  Wear shoes that:  Do not have high heels.  Have rubber bottoms.  Are comfortable and fit you well.  Are closed at the toe. Do not wear sandals.  If you use a stepladder:  Make sure that it is fully opened. Do not climb a closed stepladder.  Make sure that both sides of the stepladder are locked into place.  Ask someone to hold it for you, if possible.  Clearly mark and make sure that you can see:  Any grab bars or handrails.  First and last steps.  Where the edge of each step is.  Use tools that help you move around (mobility aids) if they are needed. These include:  Canes.  Walkers.  Scooters.  Crutches.  Turn on the lights when you go into a dark area. Replace any light bulbs as soon as they burn out.  Set up your furniture so you have a clear path. Avoid moving your furniture around.  If any of your floors are uneven, fix them.  If there are any pets around you, be aware of where they are.  Review your medicines with your doctor. Some medicines can make you feel dizzy. This can increase your chance of falling. Ask your doctor what other things that you can do to help prevent falls. This information is not intended to replace advice given to you by your health care provider. Make sure you discuss any questions you have with your health care provider. Document Released: 09/24/2009 Document Revised: 05/05/2016 Document Reviewed: 01/02/2015 Elsevier Interactive Patient Education  2017  [employer].

## 2020-06-10 NOTE — Progress Notes (Signed)
I connected with Alexander Castillo today by telephone and verified that I am speaking with the correct person using two identifiers. Location patient: home Location provider: work Persons participating in the virtual visit: Hermon [employer] and [employer]. Sir Mallis, LPN  I discussed the limitations, risks, security and privacy concerns of performing an evaluation and management service by telephone and the availability of in person appointments. I also discussed with the patient that there may be a patient responsible charge related to this service. The patient expressed understanding and verbally consented to this telephonic visit.    Interactive audio and video telecommunications were attempted between this provider and patient, however failed, due to patient having technical difficulties OR patient did not have access to video capability.  We continued and completed visit with audio only.  Some vital signs may be absent or patient reported.   Time Spent with patient on telephone encounter: 25 minutes   Subjective:   Alexander Castillo is a 79 y.o. male who presents for Medicare Annual/Subsequent preventive examination.  Review of Systems    No ROS. Medicare Wellness Virtual Visit. Additional risk factors are reflected in social history. Cardiac Risk Factors include: advanced age (>97men, >39 women);male gender     Objective:    There were no vitals filed for this visit. There is no height or weight on file to calculate BMI.  Advanced Directives 06/10/2020 01/11/2019 01/23/2018 01/23/2018 01/17/2018 06/13/2016 06/13/2016  Does Patient Have a Medical Advance Directive? Yes Yes Yes Yes Yes Yes Yes  Type of Paramedic of [employer];Living will [place];Living will Living will Living will Living will [place];Living will [employer];Living will  Does patient want to make changes to medical advance directive? No -  Patient declined - No - Patient declined No - Patient declined No - Patient declined - No - Patient declined  Copy of [place] in Chart? No - copy requested No - copy requested - - - No - copy requested No - copy requested    Current Medications (verified) No outpatient encounter medications on file as of 06/10/2020.   No facility-administered encounter medications on file as of 06/10/2020.    Allergies (verified) Patient has no known allergies.   History: Past Medical History:  Diagnosis Date  . BENIGN PROSTATIC HYPERTROPHY 03/10/2008  . BENIGN PROSTATIC HYPERTROPHY, HX OF 03/10/2008  . CHEST PAIN 03/10/2008   at pre-op 01-17-18: per patient had cardiac eval at that time was sent for test (uaware type) ; reports nothing was abnormal; no recurrence of chest pain   . DEGENERATIVE JOINT DISEASE 03/10/2008  . EAR PAIN, LEFT 12/09/2008  . HYPERLIPIDEMIA 03/10/2008  . PSA, INCREASED 03/16/2009  . RASH-NONVESICULAR 03/16/2009   Past Surgical History:  Procedure Laterality Date  . back surgury  1998   lower  . EYE SURGERY Bilateral    lens replacement for cataracts  . hip replacement left Left 2002  . s/p left knee arthroscopy  yrs ago  . TOTAL HIP ARTHROPLASTY Right 06/13/2016   Procedure: RIGHT TOTAL HIP ARTHROPLASTY ANTERIOR APPROACH;  Surgeon: Paralee Cancel, MD;  Location: [facility];  Service: Orthopedics;  Laterality: Right;  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  . TOTAL KNEE ARTHROPLASTY Right 01/23/2018   Procedure: RIGHT TOTAL KNEE ARTHROPLASTY;  Surgeon: Paralee Cancel, MD;  Location: [facility];  Service: Orthopedics;  Laterality: Right;  70 mins  . transurethral resection of prostate     Family History  Problem Relation Age of Onset  . Dementia Mother    Social History   Socioeconomic History  . Marital status: Widowed    Spouse name: Not on file  . Number of children: 2  . Years of education: Not on file  . Highest education level: Not on file  Occupational History  .  Occupation: retired Chief Strategy Officer   Tobacco Use  . Smoking status: Never Smoker  . Smokeless tobacco: Former Network engineer  . Vaping Use: Never used  Substance and Sexual Activity  . Alcohol use: No  . Drug use: No  . Sexual activity: Not Currently  Other Topics Concern  . Not on file  Social History Narrative  . Not on file   Social Determinants of Health   Financial Resource Strain: Low Risk   . Difficulty of Paying Living Expenses: Not hard at all  Food Insecurity: No Food Insecurity  . Worried About Charity fundraiser in the Last Year: Never true  . Ran Out of Food in the Last Year: Never true  Transportation Needs: No Transportation Needs  . Lack of Transportation (Medical): No  . Lack of Transportation (Non-Medical): No  Physical Activity: Sufficiently Active  . Days of Exercise per Week: 4 days  . Minutes of Exercise per Session: 60 min  Stress: No Stress Concern Present  . Feeling of Stress : Not at all  Social Connections: Unknown  . Frequency of Communication with Friends and Family: More than three times a week  . Frequency of Social Gatherings with Friends and Family: More than three times a week  . Attends Religious Services: Not on file  . Active Member of Clubs or Organizations: Not on file  . Attends Archivist Meetings: Not on file  . Marital Status: Not on file    Tobacco Counseling Counseling given: Not Answered   Clinical Intake:  Pre-visit preparation completed: Yes  Pain : No/denies pain     Nutritional Risks: None Diabetes: No  How often do you need to have someone help you when you read instructions, pamphlets, or other written materials from your doctor or pharmacy?: 1 - Never What is the last grade level you completed in school?: High School Graduate  Diabetic? no  Interpreter Needed?: No  Information entered by :: Sandford Diop N. Dolphus Linch, LPN   Activities of Daily Living In your present state of health, do you have any  difficulty performing the following activities: 06/10/2020  Hearing? N  Vision? N  Difficulty concentrating or making decisions? N  Walking or climbing stairs? N  Dressing or bathing? N  Doing errands, shopping? N  Preparing Food and eating ? N  Using the Toilet? N  In the past six months, have you accidently leaked urine? N  Do you have problems with loss of bowel control? N  Managing your Medications? N  Managing your Finances? N  Housekeeping or managing your Housekeeping? N  Some recent data might be hidden    Patient Care Team: Biagio Borg, MD as PCP - General  Indicate any recent Medical Services you may have received from other than [facility] providers in the past year (date may be approximate).     Assessment:   This is a routine wellness examination for [place].  Hearing/Vision screen No exam data present  Dietary issues and exercise activities discussed: Current Exercise Habits: Structured exercise class;Home exercise routine ([facility]es to [facility] 4 days a week, gardening and fishing 3 days a week),  Type of exercise: walking;strength training/weights;stretching, Time (Minutes): 60, Frequency (Times/Week): 3, Weekly Exercise (Minutes/Week): 180, Intensity: Moderate, Exercise limited by: None identified  Goals    . Patient Stated     Maintain current health status, stay as independent as possible.      Depression Screen PHQ 2/9 Scores 06/10/2020 07/18/2019 01/11/2019 07/16/2018 07/07/2015 07/03/2014  PHQ - 2 Score 0 0 0 0 0 0  PHQ- 9 Score - - 0 0 - -    Fall Risk Fall Risk  06/10/2020 07/18/2019 01/11/2019 07/16/2018 07/13/2017  Falls in the past year? 0 0 0 No No  Number falls in past yr: 0 - - - -  Injury with Fall? 0 - - - -  Risk for fall due to : No Fall Risks - - - -  Follow up Falls evaluation completed - - - -    Any stairs in or around the home? No  If so, are there any without handrails? No  Home free of loose throw rugs in walkways, pet beds, electrical cords, etc? Yes    Adequate lighting in your home to reduce risk of falls? Yes   ASSISTIVE DEVICES UTILIZED TO PREVENT FALLS:  Life alert? No  Use of a cane, walker or w/c? No  Grab bars in the bathroom? Yes  Shower chair or bench in shower? No  Elevated toilet seat or a handicapped toilet? No   TIMED UP AND GO:  Was the test performed? No .  Length of time to ambulate 10 feet: 0 sec.   Gait steady and fast without use of assistive device  Cognitive Function: MMSE - Mini Mental State Exam 01/11/2019  Orientation to time 5  Orientation to Place 5  Registration 3  Attention/ Calculation 5  Recall 1  Language- name 2 objects 2  Language- repeat 1  Language- follow 3 step command 3  Language- read & follow direction 1  Write a sentence 1  Copy design 1  Total score 28        Immunizations Immunization History  Administered Date(s) Administered  . Influenza Whole 09/11/2008, 09/11/2009  . Influenza,inj,Quad PF,6+ Mos 09/11/2014  . Influenza-Unspecified 10/12/2018  . PFIZER SARS-COV-2 Vaccination 12/28/2019, 01/16/2020  . Pneumococcal Conjugate-13 07/03/2014  . Pneumococcal Polysaccharide-23 03/12/2004, 03/16/2009  . Td 12/12/2004, 12/13/2007  . Tdap 07/16/2018  . Zoster 06/18/2012    TDAP status: Up to date Flu Vaccine status: Up to date Pneumococcal vaccine status: Up to date Covid-19 vaccine status: Completed vaccines  Qualifies for Shingles Vaccine? Yes   Zostavax completed No   Shingrix Completed?: No.    Education has been provided regarding the importance of this vaccine. Patient has been advised to call insurance company to determine out of pocket expense if they have not yet received this vaccine. Advised may also receive vaccine at local pharmacy or Health Dept. Verbalized acceptance and understanding.  Screening Tests Health Maintenance  Topic Date Due  . INFLUENZA VACCINE  07/12/2020  . TETANUS/TDAP  07/16/2028  . COVID-19 Vaccine  Completed  . PNA vac Low Risk  Adult  Completed    Health Maintenance  There are no preventive care reminders to display for this patient.  Colorectal cancer screening: No longer required.   Lung Cancer Screening: (Low Dose CT Chest recommended if Age 64-80 years, 30 pack-year currently smoking OR have quit w/in 15years.) does not qualify.   Lung Cancer Screening Referral: no  Additional Screening:  Hepatitis C Screening: does not qualify; Completed no  Vision Screening: Recommended annual ophthalmology exams for early detection of glaucoma and other disorders of the eye. Is the patient up to date with their annual eye exam?  Yes  Who is the provider or what is the name of the office in which the patient attends annual eye exams? [facility] If pt is not established with a provider, would they like to be referred to a provider to establish care? No .   Dental Screening: Recommended annual dental exams for proper oral hygiene  Community Resource Referral / Chronic Care Management: CRR required this visit?  No   CCM required this visit?  No      Plan:     I have personally reviewed and noted the following in the patient's chart:   . Medical and social history . Use of alcohol, tobacco or illicit drugs  . Current medications and supplements . Functional ability and status . Nutritional status . Physical activity . Advanced directives . List of other physicians . Hospitalizations, surgeries, and ER visits in previous 12 months . Vitals . Screenings to include cognitive, depression, and falls . Referrals and appointments  In addition, I have reviewed and discussed with patient certain preventive protocols, quality metrics, and best practice recommendations. A written personalized care plan for preventive services as well as general preventive health recommendations were provided to patient.     Sheral Flow, LPN   06/30/7217   Nurse Notes:  Patient is cogitatively intact. Patient  stated that he has no issues with gait and does not use any assistive devices. There were no vitals filed for this visit. There is no height or weight on file to calculate BMI.

## 2020-07-20 ENCOUNTER — Encounter: Payer: Medicare Other | Admitting: Internal Medicine

## 2020-08-07 ENCOUNTER — Encounter: Payer: Medicare Other | Admitting: Internal Medicine

## 2020-08-10 ENCOUNTER — Other Ambulatory Visit (INDEPENDENT_AMBULATORY_CARE_PROVIDER_SITE_OTHER): Payer: Medicare Other

## 2020-08-10 DIAGNOSIS — Z0000 Encounter for general adult medical examination without abnormal findings: Secondary | ICD-10-CM

## 2020-08-10 LAB — URINALYSIS, ROUTINE W REFLEX MICROSCOPIC
Bilirubin Urine: NEGATIVE
Hgb urine dipstick: NEGATIVE
Ketones, ur: NEGATIVE
Leukocytes,Ua: NEGATIVE
Nitrite: NEGATIVE
RBC / HPF: NONE SEEN (ref 0–?)
Specific Gravity, Urine: 1.025 (ref 1.000–1.030)
Total Protein, Urine: NEGATIVE
Urine Glucose: NEGATIVE
Urobilinogen, UA: 0.2 (ref 0.0–1.0)
pH: 6 (ref 5.0–8.0)

## 2020-08-10 LAB — HEPATIC FUNCTION PANEL
ALT: 18 U/L (ref 0–53)
AST: 29 U/L (ref 0–37)
Albumin: 4.2 g/dL (ref 3.5–5.2)
Alkaline Phosphatase: 42 U/L (ref 39–117)
Bilirubin, Direct: 0.1 mg/dL (ref 0.0–0.3)
Total Bilirubin: 0.8 mg/dL (ref 0.2–1.2)
Total Protein: 6.5 g/dL (ref 6.0–8.3)

## 2020-08-10 LAB — CBC WITH DIFFERENTIAL/PLATELET
Basophils Absolute: 0 10*3/uL (ref 0.0–0.1)
Basophils Relative: 0.8 % (ref 0.0–3.0)
Eosinophils Absolute: 0.1 10*3/uL (ref 0.0–0.7)
Eosinophils Relative: 1.1 % (ref 0.0–5.0)
HCT: 43.4 % (ref 39.0–52.0)
Hemoglobin: 14.4 g/dL (ref 13.0–17.0)
Lymphocytes Relative: 24 % (ref 12.0–46.0)
Lymphs Abs: 1.4 10*3/uL (ref 0.7–4.0)
MCHC: 33.3 g/dL (ref 30.0–36.0)
MCV: 95.3 fl (ref 78.0–100.0)
Monocytes Absolute: 0.5 10*3/uL (ref 0.1–1.0)
Monocytes Relative: 8.3 % (ref 3.0–12.0)
Neutro Abs: 3.7 10*3/uL (ref 1.4–7.7)
Neutrophils Relative %: 65.8 % (ref 43.0–77.0)
Platelets: 220 10*3/uL (ref 150.0–400.0)
RBC: 4.56 Mil/uL (ref 4.22–5.81)
RDW: 14 % (ref 11.5–15.5)
WBC: 5.7 10*3/uL (ref 4.0–10.5)

## 2020-08-10 LAB — BASIC METABOLIC PANEL
BUN: 17 mg/dL (ref 6–23)
CO2: 27 mEq/L (ref 19–32)
Calcium: 9.4 mg/dL (ref 8.4–10.5)
Chloride: 106 mEq/L (ref 96–112)
Creatinine, Ser: 0.91 mg/dL (ref 0.40–1.50)
GFR: 79.85 mL/min (ref >60.00)
Glucose, Bld: 88 mg/dL (ref 70–99)
Potassium: 3.7 mEq/L (ref 3.5–5.1)
Sodium: 141 mEq/L (ref 135–145)

## 2020-08-10 LAB — LIPID PANEL
Cholesterol: 153 mg/dL (ref 0–200)
HDL: 53.4 mg/dL (ref >39.00)
LDL Cholesterol: 86 mg/dL (ref 0–99)
NonHDL: 99.43
Total CHOL/HDL Ratio: 3
Triglycerides: 68 mg/dL (ref 0.0–149.0)
VLDL: 13.6 mg/dL (ref 0.0–40.0)

## 2020-08-10 LAB — TSH: TSH: 3.25 u[IU]/mL (ref 0.35–4.50)

## 2020-08-13 ENCOUNTER — Encounter: Payer: Self-pay | Admitting: Internal Medicine

## 2020-08-13 ENCOUNTER — Other Ambulatory Visit: Payer: Self-pay

## 2020-08-13 ENCOUNTER — Ambulatory Visit (INDEPENDENT_AMBULATORY_CARE_PROVIDER_SITE_OTHER): Payer: Medicare Other | Admitting: Internal Medicine

## 2020-08-13 VITALS — BP 130/70 | HR 77 | Temp 97.8°F | Ht 69.0 in | Wt 170.0 lb

## 2020-08-13 DIAGNOSIS — Z23 Encounter for immunization: Secondary | ICD-10-CM | POA: Diagnosis not present

## 2020-08-13 DIAGNOSIS — Z0000 Encounter for general adult medical examination without abnormal findings: Secondary | ICD-10-CM

## 2020-08-13 MED ORDER — MELOXICAM 7.5 MG PO TABS
ORAL_TABLET | ORAL | 3 refills | Status: DC
Start: 2020-08-13 — End: 2020-12-25

## 2020-08-13 NOTE — Patient Instructions (Signed)

## 2020-08-13 NOTE — Progress Notes (Signed)
Subjective:    Patient ID: Alexander Castillo, male    DOB: Jun 20, 1941, 79 y.o.   MRN: [id number]  HPI  Here for wellness and f/u;  Overall doing ok;  Pt denies Chest pain, worsening SOB, DOE, wheezing, orthopnea, PND, worsening LE edema, palpitations, dizziness or syncope.  Pt denies neurological change such as new headache, facial or extremity weakness.  Pt denies polydipsia, polyuria, or low sugar symptoms. Pt states overall good compliance with treatment and medications, good tolerability, and has been trying to follow appropriate diet.  Pt denies worsening depressive symptoms, suicidal ideation or panic. No fever, night sweats, wt loss, loss of appetite, or other constitutional symptoms.  Pt states good ability with ADL's, has low fall risk, home safety reviewed and adequate, no other significant changes in hearing or vision, and active with frequent fishing, but o/w has ongoing chronic left hip pain s/p replacement possibly needing a new replcement, but also endstage left ankle djd as well needing replacement, for which he has been putting this off Past Medical History:  Diagnosis Date  . BENIGN PROSTATIC HYPERTROPHY 03/10/2008  . BENIGN PROSTATIC HYPERTROPHY, HX OF 03/10/2008  . CHEST PAIN 03/10/2008   at pre-op 01-17-18: per patient had cardiac eval at that time was sent for test (uaware type) ; reports nothing was abnormal; no recurrence of chest pain   . DEGENERATIVE JOINT DISEASE 03/10/2008  . EAR PAIN, LEFT 12/09/2008  . HYPERLIPIDEMIA 03/10/2008  . PSA, INCREASED 03/16/2009  . RASH-NONVESICULAR 03/16/2009   Past Surgical History:  Procedure Laterality Date  . back surgury  1998   lower  . EYE SURGERY Bilateral    lens replacement for cataracts  . hip replacement left Left 2002  . s/p left knee arthroscopy  yrs ago  . TOTAL HIP ARTHROPLASTY Right 06/13/2016   Procedure: RIGHT TOTAL HIP ARTHROPLASTY ANTERIOR APPROACH;  Surgeon: Paralee Cancel, MD;  Location: [facility];  Service: Orthopedics;   Laterality: Right;  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  . TOTAL KNEE ARTHROPLASTY Right 01/23/2018   Procedure: RIGHT TOTAL KNEE ARTHROPLASTY;  Surgeon: Paralee Cancel, MD;  Location: [facility];  Service: Orthopedics;  Laterality: Right;  70 mins  . transurethral resection of prostate      reports that he has never smoked. He has quit using smokeless tobacco. He reports that he does not drink alcohol and does not use drugs. family history includes Dementia in his mother. No Known Allergies Current Outpatient Medications on File Prior to Visit  Medication Sig Dispense Refill  . Influenza Vac High-Dose Quad (FLUZONE HIGH-DOSE QUADRIVALENT) 0.7 ML SUSY Fluzone High-Dose Quad 2020-21 (PF) 240 mcg/0.7 mL IM syringe  PHARMACY ADMINISTERED     No current facility-administered medications on file prior to visit.   Review of Systems All otherwise neg per pt    Objective:   Physical Exam BP 130/70 (BP Location: Left Arm, Patient Position: Sitting, Cuff Size: Large)   Pulse 77   Temp 97.8 F (36.6 C) (Oral)   Ht 5\' 9"  (1.753 m)   Wt 170 lb (77.1 kg)   SpO2 98%   BMI 25.10 kg/m  VS noted,  Constitutional: Pt appears in NAD HENT: Head: NCAT.  Right Ear: External ear normal.  Left Ear: External ear normal.  Eyes: . Pupils are equal, round, and reactive to light. Conjunctivae and EOM are normal Nose: without d/c or deformity Neck: Neck supple. Gross normal ROM Cardiovascular: Normal rate and regular rhythm.   Pulmonary/Chest: Effort normal and breath sounds without  rales or wheezing.  Abd:  Soft, NT, ND, + BS, no organomegaly Neurological: Pt is alert. At baseline orientation, motor grossly intact Skin: Skin is warm. No rashes, other new lesions, no LE edema Psychiatric: Pt behavior is normal without agitation  All otherwise neg per pt. Lab Results  Component Value Date   WBC 5.7 08/10/2020   HGB 14.4 08/10/2020   HCT 43.4 08/10/2020   PLT 220.0 08/10/2020   GLUCOSE 88 08/10/2020   CHOL 153  08/10/2020   TRIG 68.0 08/10/2020   HDL 53.40 08/10/2020   LDLCALC 86 08/10/2020   ALT 18 08/10/2020   AST 29 08/10/2020   NA 141 08/10/2020   K 3.7 08/10/2020   CL 106 08/10/2020   CREATININE 0.91 08/10/2020   BUN 17 08/10/2020   CO2 27 08/10/2020   TSH 3.25 08/10/2020   PSA 1.95 07/12/2019      Assessment & Plan:

## 2020-08-13 NOTE — Assessment & Plan Note (Signed)

## 2020-09-04 DIAGNOSIS — M25552 Pain in left hip: Secondary | ICD-10-CM | POA: Diagnosis not present

## 2020-09-04 DIAGNOSIS — T8484XA Pain due to internal orthopedic prosthetic devices, implants and grafts, initial encounter: Secondary | ICD-10-CM | POA: Diagnosis not present

## 2020-09-07 ENCOUNTER — Other Ambulatory Visit: Payer: Self-pay | Admitting: Orthopedic Surgery

## 2020-09-07 ENCOUNTER — Other Ambulatory Visit (HOSPITAL_COMMUNITY): Payer: Self-pay | Admitting: Orthopedic Surgery

## 2020-09-07 DIAGNOSIS — Z96642 Presence of left artificial hip joint: Secondary | ICD-10-CM

## 2020-09-22 ENCOUNTER — Encounter (HOSPITAL_COMMUNITY)
Admission: RE | Admit: 2020-09-22 | Discharge: 2020-09-22 | Disposition: A | Discharge status: Home / Self Care | Payer: Medicare Other | Source: Ambulatory Visit | Attending: Orthopedic Surgery | Admitting: Orthopedic Surgery

## 2020-09-22 ENCOUNTER — Other Ambulatory Visit: Payer: Self-pay

## 2020-09-22 DIAGNOSIS — Z96642 Presence of left artificial hip joint: Secondary | ICD-10-CM | POA: Diagnosis not present

## 2020-09-22 DIAGNOSIS — M25552 Pain in left hip: Secondary | ICD-10-CM | POA: Diagnosis not present

## 2020-09-22 MED ORDER — TECHNETIUM TC 99M MEDRONATE IV KIT
21.2000 | PACK | Freq: Once | INTRAVENOUS | Status: AC | PRN
  Administered 2020-09-22: 21.2 via INTRAVENOUS

## 2020-10-07 DIAGNOSIS — M25552 Pain in left hip: Secondary | ICD-10-CM | POA: Diagnosis not present

## 2020-10-07 DIAGNOSIS — Z96642 Presence of left artificial hip joint: Secondary | ICD-10-CM | POA: Diagnosis not present

## 2020-11-13 DIAGNOSIS — M19072 Primary osteoarthritis, left ankle and foot: Secondary | ICD-10-CM | POA: Diagnosis not present

## 2020-11-17 ENCOUNTER — Other Ambulatory Visit (HOSPITAL_COMMUNITY): Payer: Self-pay | Admitting: Orthopedic Surgery

## 2020-11-17 DIAGNOSIS — L812 Freckles: Secondary | ICD-10-CM | POA: Diagnosis not present

## 2020-11-17 DIAGNOSIS — D225 Melanocytic nevi of trunk: Secondary | ICD-10-CM | POA: Diagnosis not present

## 2020-11-17 DIAGNOSIS — L821 Other seborrheic keratosis: Secondary | ICD-10-CM | POA: Diagnosis not present

## 2020-11-17 DIAGNOSIS — L720 Epidermal cyst: Secondary | ICD-10-CM | POA: Diagnosis not present

## 2020-11-17 DIAGNOSIS — L570 Actinic keratosis: Secondary | ICD-10-CM | POA: Diagnosis not present

## 2020-11-23 DIAGNOSIS — M25572 Pain in left ankle and joints of left foot: Secondary | ICD-10-CM | POA: Diagnosis not present

## 2020-12-24 DIAGNOSIS — H353111 Nonexudative age-related macular degeneration, right eye, early dry stage: Secondary | ICD-10-CM | POA: Diagnosis not present

## 2020-12-24 DIAGNOSIS — H31091 Other chorioretinal scars, right eye: Secondary | ICD-10-CM | POA: Diagnosis not present

## 2020-12-24 DIAGNOSIS — H02831 Dermatochalasis of right upper eyelid: Secondary | ICD-10-CM | POA: Diagnosis not present

## 2020-12-24 DIAGNOSIS — H26493 Other secondary cataract, bilateral: Secondary | ICD-10-CM | POA: Diagnosis not present

## 2020-12-24 DIAGNOSIS — H02834 Dermatochalasis of left upper eyelid: Secondary | ICD-10-CM | POA: Diagnosis not present

## 2020-12-25 ENCOUNTER — Encounter (HOSPITAL_BASED_OUTPATIENT_CLINIC_OR_DEPARTMENT_OTHER): Payer: Self-pay | Admitting: Orthopedic Surgery

## 2020-12-25 ENCOUNTER — Other Ambulatory Visit: Payer: Self-pay

## 2020-12-28 ENCOUNTER — Other Ambulatory Visit (HOSPITAL_COMMUNITY): Payer: Medicare Other

## 2020-12-29 ENCOUNTER — Encounter (HOSPITAL_BASED_OUTPATIENT_CLINIC_OR_DEPARTMENT_OTHER)
Admission: RE | Admit: 2020-12-29 | Discharge: 2020-12-29 | Disposition: A | Discharge status: Home / Self Care | Payer: Medicare Other | Source: Ambulatory Visit | Attending: Orthopedic Surgery | Admitting: Orthopedic Surgery

## 2020-12-29 ENCOUNTER — Other Ambulatory Visit (HOSPITAL_COMMUNITY)
Admission: RE | Admit: 2020-12-29 | Discharge: 2020-12-29 | Disposition: A | Discharge status: Home / Self Care | Payer: Medicare Other | Source: Ambulatory Visit | Attending: Orthopedic Surgery | Admitting: Orthopedic Surgery

## 2020-12-29 DIAGNOSIS — Z01812 Encounter for preprocedural laboratory examination: Secondary | ICD-10-CM | POA: Insufficient documentation

## 2020-12-29 DIAGNOSIS — X58XXXS Exposure to other specified factors, sequela: Secondary | ICD-10-CM | POA: Diagnosis not present

## 2020-12-29 DIAGNOSIS — M19072 Primary osteoarthritis, left ankle and foot: Secondary | ICD-10-CM | POA: Diagnosis present

## 2020-12-29 DIAGNOSIS — M6702 Short Achilles tendon (acquired), left ankle: Secondary | ICD-10-CM | POA: Diagnosis not present

## 2020-12-29 DIAGNOSIS — Z20822 Contact with and (suspected) exposure to covid-19: Secondary | ICD-10-CM | POA: Diagnosis not present

## 2020-12-29 DIAGNOSIS — T1490XS Injury, unspecified, sequela: Secondary | ICD-10-CM | POA: Diagnosis not present

## 2020-12-29 DIAGNOSIS — M12572 Traumatic arthropathy, left ankle and foot: Secondary | ICD-10-CM | POA: Diagnosis not present

## 2020-12-29 LAB — CBC
HCT: 40.8 % (ref 39.0–52.0)
Hemoglobin: 13.3 g/dL (ref 13.0–17.0)
MCH: 31.5 pg (ref 26.0–34.0)
MCHC: 32.6 g/dL (ref 30.0–36.0)
MCV: 96.7 fL (ref 80.0–100.0)
Platelets: 170 10*3/uL (ref 150–400)
RBC: 4.22 MIL/uL (ref 4.22–5.81)
RDW: 13.9 % (ref 11.5–15.5)
WBC: 4.8 10*3/uL (ref 4.0–10.5)
nRBC: 0 % (ref 0.0–0.2)

## 2020-12-29 LAB — SARS CORONAVIRUS 2 (TAT 6-24 HRS): SARS Coronavirus 2: NEGATIVE

## 2020-12-29 LAB — BASIC METABOLIC PANEL
Anion gap: 8 (ref 5–15)
BUN: 15 mg/dL (ref 8–23)
CO2: 28 mmol/L (ref 22–32)
Calcium: 8.6 mg/dL — ABNORMAL LOW (ref 8.9–10.3)
Chloride: 102 mmol/L (ref 98–111)
Creatinine, Ser: 1.01 mg/dL (ref 0.61–1.24)
GFR, Estimated: >60 mL/min (ref >60)
Glucose, Bld: 79 mg/dL (ref 70–99)
Potassium: 4.3 mmol/L (ref 3.5–5.1)
Sodium: 138 mmol/L (ref 135–145)

## 2020-12-29 LAB — SURGICAL PCR SCREEN
MRSA, PCR: NEGATIVE
Staphylococcus aureus: NEGATIVE

## 2020-12-29 NOTE — Progress Notes (Signed)

## 2020-12-30 NOTE — Anesthesia Preprocedure Evaluation (Addendum)
Anesthesia Evaluation  Patient identified by MRN, date of birth, ID band Patient awake    Reviewed: Allergy & Precautions, NPO status , Patient's Chart, lab work & pertinent test results  Airway Mallampati: II  TM Distance: >3 FB Neck ROM: Full    Dental  (+) Dental Advisory Given   Pulmonary neg pulmonary ROS,    breath sounds clear to auscultation       Cardiovascular negative cardio ROS   Rhythm:Regular Rate:Normal     Neuro/Psych PSYCHIATRIC DISORDERS Depression negative neurological ROS     GI/Hepatic negative GI ROS, Neg liver ROS,   Endo/Other  negative endocrine ROS  Renal/GU negative Renal ROS     Musculoskeletal  (+) Arthritis ,   Abdominal   Peds  Hematology negative hematology ROS (+)   Anesthesia Other Findings   Reproductive/Obstetrics                            Anesthesia Physical Anesthesia Plan  ASA: II  Anesthesia Plan: General   Post-op Pain Management: GA combined w/ Regional for post-op pain   Induction: Intravenous  PONV Risk Score and Plan: 2 and Ondansetron and Dexamethasone  Airway Management Planned: LMA  Additional Equipment:   Intra-op Plan:   Post-operative Plan: Extubation in OR  Informed Consent: I have reviewed the patients History and Physical, chart, labs and discussed the procedure including the risks, benefits and alternatives for the proposed anesthesia with the patient or authorized representative who has indicated his/her understanding and acceptance.     Dental advisory given  Plan Discussed with: Anesthesiologist  Anesthesia Plan Comments:        Anesthesia Quick Evaluation

## 2020-12-31 ENCOUNTER — Ambulatory Visit (HOSPITAL_BASED_OUTPATIENT_CLINIC_OR_DEPARTMENT_OTHER): Payer: Medicare Other | Admitting: Anesthesiology

## 2020-12-31 ENCOUNTER — Ambulatory Visit (HOSPITAL_BASED_OUTPATIENT_CLINIC_OR_DEPARTMENT_OTHER)
Admission: RE | Admit: 2020-12-31 | Discharge: 2020-12-31 | Disposition: A | Discharge status: Home / Self Care | Payer: Medicare Other | Attending: Orthopedic Surgery | Admitting: Orthopedic Surgery

## 2020-12-31 ENCOUNTER — Encounter (HOSPITAL_BASED_OUTPATIENT_CLINIC_OR_DEPARTMENT_OTHER): Payer: Self-pay | Admitting: Orthopedic Surgery

## 2020-12-31 ENCOUNTER — Other Ambulatory Visit: Payer: Self-pay

## 2020-12-31 ENCOUNTER — Encounter (HOSPITAL_BASED_OUTPATIENT_CLINIC_OR_DEPARTMENT_OTHER)
Admission: RE | Disposition: A | Discharge status: Home / Self Care | Payer: Self-pay | Source: Home / Self Care | Attending: Orthopedic Surgery

## 2020-12-31 ENCOUNTER — Ambulatory Visit (HOSPITAL_COMMUNITY): Payer: Medicare Other

## 2020-12-31 DIAGNOSIS — M19072 Primary osteoarthritis, left ankle and foot: Secondary | ICD-10-CM | POA: Diagnosis present

## 2020-12-31 DIAGNOSIS — E785 Hyperlipidemia, unspecified: Secondary | ICD-10-CM | POA: Diagnosis not present

## 2020-12-31 DIAGNOSIS — M12572 Traumatic arthropathy, left ankle and foot: Secondary | ICD-10-CM | POA: Diagnosis not present

## 2020-12-31 DIAGNOSIS — Z419 Encounter for procedure for purposes other than remedying health state, unspecified: Secondary | ICD-10-CM

## 2020-12-31 DIAGNOSIS — T1490XS Injury, unspecified, sequela: Secondary | ICD-10-CM | POA: Insufficient documentation

## 2020-12-31 DIAGNOSIS — G8918 Other acute postprocedural pain: Secondary | ICD-10-CM | POA: Diagnosis not present

## 2020-12-31 DIAGNOSIS — X58XXXS Exposure to other specified factors, sequela: Secondary | ICD-10-CM | POA: Insufficient documentation

## 2020-12-31 DIAGNOSIS — Z471 Aftercare following joint replacement surgery: Secondary | ICD-10-CM | POA: Diagnosis not present

## 2020-12-31 DIAGNOSIS — Z96662 Presence of left artificial ankle joint: Secondary | ICD-10-CM | POA: Diagnosis not present

## 2020-12-31 DIAGNOSIS — M6702 Short Achilles tendon (acquired), left ankle: Secondary | ICD-10-CM | POA: Diagnosis not present

## 2020-12-31 DIAGNOSIS — M19172 Post-traumatic osteoarthritis, left ankle and foot: Secondary | ICD-10-CM | POA: Diagnosis not present

## 2020-12-31 HISTORY — PX: TOTAL ANKLE ARTHROPLASTY: SHX811

## 2020-12-31 HISTORY — DX: Unspecified osteoarthritis, unspecified site: M19.90

## 2020-12-31 HISTORY — PX: ACHILLES TENDON LENGTHENING: SHX6455

## 2020-12-31 SURGERY — ARTHROPLASTY, ANKLE, TOTAL
Anesthesia: General | Site: Leg Lower | Laterality: Left

## 2020-12-31 MED ORDER — PROPOFOL 10 MG/ML IV BOLUS
INTRAVENOUS | Status: AC
  Filled 2020-12-31: qty 20

## 2020-12-31 MED ORDER — CELECOXIB 200 MG PO CAPS
ORAL_CAPSULE | ORAL | Status: AC
  Filled 2020-12-31: qty 1

## 2020-12-31 MED ORDER — VANCOMYCIN HCL 500 MG IV SOLR
INTRAVENOUS | Status: AC
  Filled 2020-12-31: qty 500

## 2020-12-31 MED ORDER — LACTATED RINGERS IV SOLN: INTRAVENOUS | Status: DC

## 2020-12-31 MED ORDER — ONDANSETRON HCL 4 MG/2ML IJ SOLN
INTRAMUSCULAR | Status: AC
  Filled 2020-12-31: qty 12

## 2020-12-31 MED ORDER — ACETAMINOPHEN 500 MG PO TABS
ORAL_TABLET | ORAL | Status: AC
  Filled 2020-12-31: qty 2

## 2020-12-31 MED ORDER — DEXAMETHASONE SODIUM PHOSPHATE 10 MG/ML IJ SOLN
INTRAMUSCULAR | Status: DC | PRN
  Administered 2020-12-31: 4 mg via INTRAVENOUS

## 2020-12-31 MED ORDER — DEXAMETHASONE SODIUM PHOSPHATE 10 MG/ML IJ SOLN
INTRAMUSCULAR | Status: AC
  Filled 2020-12-31: qty 3

## 2020-12-31 MED ORDER — ACETAMINOPHEN 500 MG PO TABS
1000.0000 mg | ORAL_TABLET | Freq: Once | ORAL | Status: AC
  Administered 2020-12-31: 1000 mg via ORAL

## 2020-12-31 MED ORDER — LIDOCAINE 2% (20 MG/ML) 5 ML SYRINGE
INTRAMUSCULAR | Status: AC
  Filled 2020-12-31: qty 20

## 2020-12-31 MED ORDER — PHENYLEPHRINE 40 MCG/ML (10ML) SYRINGE FOR IV PUSH (FOR BLOOD PRESSURE SUPPORT)
PREFILLED_SYRINGE | INTRAVENOUS | Status: AC
  Filled 2020-12-31: qty 20

## 2020-12-31 MED ORDER — BUPIVACAINE-EPINEPHRINE (PF) 0.5% -1:200000 IJ SOLN
INTRAMUSCULAR | Status: AC
  Filled 2020-12-31: qty 30

## 2020-12-31 MED ORDER — BUPIVACAINE LIPOSOME 1.3 % IJ SUSP
INTRAMUSCULAR | Status: DC | PRN
  Administered 2020-12-31: 10 mL via PERINEURAL

## 2020-12-31 MED ORDER — PHENYLEPHRINE 40 MCG/ML (10ML) SYRINGE FOR IV PUSH (FOR BLOOD PRESSURE SUPPORT)
PREFILLED_SYRINGE | INTRAVENOUS | Status: AC
  Filled 2020-12-31: qty 10

## 2020-12-31 MED ORDER — CEFAZOLIN SODIUM-DEXTROSE 2-4 GM/100ML-% IV SOLN
2.0000 g | INTRAVENOUS | Status: AC
  Administered 2020-12-31: 2 g via INTRAVENOUS

## 2020-12-31 MED ORDER — PROPOFOL 10 MG/ML IV BOLUS
INTRAVENOUS | Status: DC | PRN
  Administered 2020-12-31: 150 mg via INTRAVENOUS

## 2020-12-31 MED ORDER — VANCOMYCIN HCL 500 MG IV SOLR
INTRAVENOUS | Status: DC | PRN
  Administered 2020-12-31: 500 mg via TOPICAL

## 2020-12-31 MED ORDER — ONDANSETRON HCL 4 MG/2ML IJ SOLN
INTRAMUSCULAR | Status: DC | PRN
  Administered 2020-12-31: 4 mg via INTRAVENOUS

## 2020-12-31 MED ORDER — EPHEDRINE 5 MG/ML INJ
INTRAVENOUS | Status: AC
  Filled 2020-12-31: qty 20

## 2020-12-31 MED ORDER — SODIUM CHLORIDE 0.9 % IV SOLN: INTRAVENOUS | Status: DC

## 2020-12-31 MED ORDER — FENTANYL CITRATE (PF) 100 MCG/2ML IJ SOLN
50.0000 ug | Freq: Once | INTRAMUSCULAR | Status: AC
  Administered 2020-12-31: 50 ug via INTRAVENOUS

## 2020-12-31 MED ORDER — CEFAZOLIN SODIUM-DEXTROSE 2-4 GM/100ML-% IV SOLN
INTRAVENOUS | Status: AC
  Filled 2020-12-31: qty 100

## 2020-12-31 MED ORDER — OXYCODONE HCL 5 MG PO TABS
5.0000 mg | ORAL_TABLET | ORAL | 0 refills | Status: AC | PRN
Start: 2020-12-31 — End: 2021-01-05

## 2020-12-31 MED ORDER — DOCUSATE SODIUM 100 MG PO CAPS: 100.0000 mg | ORAL_CAPSULE | Freq: Two times a day (BID) | ORAL | 0 refills | Status: DC

## 2020-12-31 MED ORDER — CELECOXIB 200 MG PO CAPS
200.0000 mg | ORAL_CAPSULE | Freq: Once | ORAL | Status: AC
  Administered 2020-12-31: 200 mg via ORAL

## 2020-12-31 MED ORDER — 0.9 % SODIUM CHLORIDE (POUR BTL) OPTIME
TOPICAL | Status: DC | PRN
  Administered 2020-12-31: 400 mL

## 2020-12-31 MED ORDER — EPHEDRINE 5 MG/ML INJ
INTRAVENOUS | Status: AC
  Filled 2020-12-31: qty 10

## 2020-12-31 MED ORDER — BUPIVACAINE HCL (PF) 0.25 % IJ SOLN
INTRAMUSCULAR | Status: DC | PRN
  Administered 2020-12-31: 20 mL

## 2020-12-31 MED ORDER — SENNA 8.6 MG PO TABS: 2.0000 | ORAL_TABLET | Freq: Two times a day (BID) | ORAL | 0 refills | Status: DC

## 2020-12-31 MED ORDER — MIDAZOLAM HCL 2 MG/2ML IJ SOLN
INTRAMUSCULAR | Status: AC
  Filled 2020-12-31: qty 2

## 2020-12-31 MED ORDER — ASPIRIN EC 81 MG PO TBEC: 81.0000 mg | DELAYED_RELEASE_TABLET | Freq: Two times a day (BID) | ORAL | 0 refills | Status: DC

## 2020-12-31 MED ORDER — ROPIVACAINE HCL 5 MG/ML IJ SOLN
INTRAMUSCULAR | Status: DC | PRN
  Administered 2020-12-31: 20 mL via PERINEURAL

## 2020-12-31 MED ORDER — FENTANYL CITRATE (PF) 100 MCG/2ML IJ SOLN
25.0000 ug | INTRAMUSCULAR | Status: DC | PRN
Start: 2020-12-31 — End: 2020-12-31

## 2020-12-31 MED ORDER — SUCCINYLCHOLINE CHLORIDE 200 MG/10ML IV SOSY
PREFILLED_SYRINGE | INTRAVENOUS | Status: AC
  Filled 2020-12-31: qty 20

## 2020-12-31 MED ORDER — FENTANYL CITRATE (PF) 100 MCG/2ML IJ SOLN
INTRAMUSCULAR | Status: AC
  Filled 2020-12-31: qty 2

## 2020-12-31 MED ORDER — AMISULPRIDE (ANTIEMETIC) 5 MG/2ML IV SOLN: 10.0000 mg | Freq: Once | INTRAVENOUS | Status: DC | PRN

## 2020-12-31 MED ORDER — PROPOFOL 500 MG/50ML IV EMUL
INTRAVENOUS | Status: DC | PRN
  Administered 2020-12-31: 25 ug/kg/min via INTRAVENOUS

## 2020-12-31 SURGICAL SUPPLY — 86 items
APL PRP STRL LF DISP 70% ISPRP (MISCELLANEOUS) ×2
BANDAGE ESMARK 6X9 LF (GAUZE/BANDAGES/DRESSINGS) IMPLANT
BLADE KM SAW (BLADE) ×1 IMPLANT
BLADE OSC (BLADE) IMPLANT
BLADE RECIP (BLADE) IMPLANT
BLADE RECIPRO TAPERED (BLADE) ×3 IMPLANT
BLADE SAW OSC ANKLE 8X63X1.19 (BLADE) ×1 IMPLANT
BLADE SAW RECIP ANKLE 8X50X1 (PIN) IMPLANT
BLADE SURG 15 STRL LF DISP TIS (BLADE) ×6 IMPLANT
BLADE SURG 15 STRL SS (BLADE) ×9
BNDG CMPR 9X6 STRL LF SNTH (GAUZE/BANDAGES/DRESSINGS)
BNDG COHESIVE 4X5 TAN STRL (GAUZE/BANDAGES/DRESSINGS) ×2 IMPLANT
BNDG ELASTIC 4X5.8 VLCR STR LF (GAUZE/BANDAGES/DRESSINGS) ×3 IMPLANT
BNDG ELASTIC 6X5.8 VLCR STR LF (GAUZE/BANDAGES/DRESSINGS) IMPLANT
BNDG ESMARK 6X9 LF (GAUZE/BANDAGES/DRESSINGS)
CHLORAPREP W/TINT 26 (MISCELLANEOUS) ×3 IMPLANT
CLIP LOCKING ANKLE SZ3 (Clip) ×1 IMPLANT
COVER BACK TABLE 60X90IN (DRAPES) ×6 IMPLANT
COVER MAYO STAND STRL (DRAPES) IMPLANT
COVER WAND RF STERILE (DRAPES) IMPLANT
CUFF TOURN SGL QUICK 24 (TOURNIQUET CUFF) ×3
CUFF TOURN SGL QUICK 34 (TOURNIQUET CUFF)
CUFF TRNQT CYL 24X4X16.5-23 (TOURNIQUET CUFF) IMPLANT
CUFF TRNQT CYL 34X4.125X (TOURNIQUET CUFF) IMPLANT
DECANTER SPIKE VIAL GLASS SM (MISCELLANEOUS) IMPLANT
DRAPE C-ARM 42X72 X-RAY (DRAPES) ×3 IMPLANT
DRAPE C-ARMOR (DRAPES) ×3 IMPLANT
DRAPE EXTREMITY T 121X128X90 (DISPOSABLE) ×3 IMPLANT
DRAPE U-SHAPE 47X51 STRL (DRAPES) ×3 IMPLANT
DRESSING MEPILEX FLEX 4X4 (GAUZE/BANDAGES/DRESSINGS) ×2 IMPLANT
DRSG MEPILEX BORDER 4X8 (GAUZE/BANDAGES/DRESSINGS) IMPLANT
DRSG MEPILEX FLEX 4X4 (GAUZE/BANDAGES/DRESSINGS) ×3
DRSG MEPITEL 4X7.2 (GAUZE/BANDAGES/DRESSINGS) ×3 IMPLANT
DRSG PAD ABDOMINAL 8X10 ST (GAUZE/BANDAGES/DRESSINGS) ×6 IMPLANT
ELECT REM PT RETURN 9FT ADLT (ELECTROSURGICAL) ×3
ELECTRODE REM PT RTRN 9FT ADLT (ELECTROSURGICAL) ×2 IMPLANT
GAUZE SPONGE 4X4 12PLY STRL (GAUZE/BANDAGES/DRESSINGS) ×3 IMPLANT
GLOVE BIO SURGEON STRL SZ8 (GLOVE) ×3 IMPLANT
GLOVE ECLIPSE 8.0 STRL XLNG CF (GLOVE) ×3 IMPLANT
GLOVE SRG 8 PF TXTR STRL LF DI (GLOVE) ×4 IMPLANT
GLOVE SURG SS PI 7.0 STRL IVOR (GLOVE) ×1 IMPLANT
GLOVE SURG UNDER POLY LF SZ7 (GLOVE) ×2 IMPLANT
GLOVE SURG UNDER POLY LF SZ8 (GLOVE) ×9
GOWN STRL REUS W/ TWL LRG LVL3 (GOWN DISPOSABLE) ×2 IMPLANT
GOWN STRL REUS W/ TWL XL LVL3 (GOWN DISPOSABLE) ×2 IMPLANT
GOWN STRL REUS W/TWL LRG LVL3 (GOWN DISPOSABLE) ×3
GOWN STRL REUS W/TWL XL LVL3 (GOWN DISPOSABLE) ×6 IMPLANT
IMPL TALAR PC VT SZ 3 LT (Ankle) IMPLANT
IMPLANT TALAR PC VT SZ 3 LT (Ankle) ×3 IMPLANT
INSERT TIB FB ANKLE SZ 3X7 LT (Ankle) ×1 IMPLANT
NEEDLE HYPO 22GX1.5 SAFETY (NEEDLE) IMPLANT
NS IRRIG 1000ML POUR BTL (IV SOLUTION) ×3 IMPLANT
PACK BASIN DAY SURGERY FS (CUSTOM PROCEDURE TRAY) ×3 IMPLANT
PAD CAST 4YDX4 CTTN HI CHSV (CAST SUPPLIES) ×4 IMPLANT
PADDING CAST ABS 4INX4YD NS (CAST SUPPLIES)
PADDING CAST ABS COTTON 4X4 ST (CAST SUPPLIES) IMPLANT
PADDING CAST COTTON 4X4 STRL (CAST SUPPLIES) ×6
PADDING CAST COTTON 6X4 STRL (CAST SUPPLIES) ×3 IMPLANT
PENCIL SMOKE EVACUATOR (MISCELLANEOUS) ×3 IMPLANT
PIN POUCH TALAR VANTAGE 2.0 (PIN) ×1 IMPLANT
PIN POUCH TALAR VANTAGE 2.5 (PIN) ×1 IMPLANT
PIN POUCH TALAR VANTAGE 3.5 (PIN) ×2 IMPLANT
PLATE TIB FB ANKLE SZ 3 LT (Ankle) ×1 IMPLANT
SANITIZER HAND PURELL 535ML FO (MISCELLANEOUS) ×3 IMPLANT
SCOTCHCAST PLUS 3X4 WHITE (CAST SUPPLIES) ×2 IMPLANT
SCOTCHCAST PLUS 4X4 WHITE (CAST SUPPLIES) ×1 IMPLANT
SHEET MEDIUM DRAPE 40X70 STRL (DRAPES) ×3 IMPLANT
SLEEVE SCD COMPRESS KNEE MED (MISCELLANEOUS) ×3 IMPLANT
SPONGE LAP 18X18 RF (DISPOSABLE) ×4 IMPLANT
STOCKINETTE 6  STRL (DRAPES) ×3
STOCKINETTE 6 STRL (DRAPES) ×2 IMPLANT
SUCTION FRAZIER HANDLE 10FR (MISCELLANEOUS) ×3
SUCTION TUBE FRAZIER 10FR DISP (MISCELLANEOUS) IMPLANT
SURGILUBE 2OZ TUBE FLIPTOP (MISCELLANEOUS) ×3 IMPLANT
SUT ETHILON 3 0 PS 1 (SUTURE) ×6 IMPLANT
SUT MNCRL AB 3-0 PS2 18 (SUTURE) ×6 IMPLANT
SUT VIC AB 0 CT1 27 (SUTURE) ×3
SUT VIC AB 0 CT1 27XBRD ANBCTR (SUTURE) ×2 IMPLANT
SUT VIC AB 2-0 SH 27 (SUTURE)
SUT VIC AB 2-0 SH 27XBRD (SUTURE) IMPLANT
SYR 20ML LL LF (SYRINGE) IMPLANT
SYR BULB IRRIG 60ML STRL (SYRINGE) ×3 IMPLANT
TOWEL GREEN STERILE FF (TOWEL DISPOSABLE) ×6 IMPLANT
TUBE CONNECTING 20X1/4 (TUBING) ×3 IMPLANT
UNDERPAD 30X36 HEAVY ABSORB (UNDERPADS AND DIAPERS) ×3 IMPLANT
YANKAUER SUCT BULB TIP NO VENT (SUCTIONS) ×3 IMPLANT

## 2020-12-31 NOTE — Op Note (Signed)
12/31/2020  9:36 AM  PATIENT:  Alexander Castillo  80 y.o. male  PRE-OPERATIVE DIAGNOSIS: 1.  Left ankle post traumatic arthritis      2.  Short left achilles tendon    POST-OPERATIVE DIAGNOSIS:  Same  Procedure(s): 1.  Left total ankle replacement 2.  Percutaneous Left Achilles Tendon Lengthening  SURGEON:  Wylene Simmer, MD  ASSISTANT: Mechele Claude, PA-C  ANESTHESIA:   General, regional  EBL:  minimal   TOURNIQUET:   Total Tourniquet Time Documented: Thigh (Left) - 99 minutes Total: Thigh (Left) - 99 minutes  COMPLICATIONS:  None apparent  DISPOSITION:  Extubated, awake and stable to recovery.  INDICATION FOR PROCEDURE: The patient is an 80 year old male without significant past medical history.  He has a long history of left ankle pain due to posttraumatic arthritis.  He presents now for left total ankle replacement having failed all nonoperative treatment to date.  The risks and benefits of the alternative treatment options have been discussed in detail.  The patient wishes to proceed with surgery and specifically understands risks of bleeding, infection, nerve damage, blood clots, need for additional surgery, amputation and death.  PROCEDURE IN DETAIL:  After pre operative consent was obtained, and the correct operative site was identified, the patient was brought to the operating room and placed supine on the OR table.  Anesthesia was administered.  Pre-operative antibiotics were administered.  A surgical timeout was taken.  The left lower extremity was prepped and draped in standard sterile fashion with a tourniquet around the thigh.  The extremity was elevated and the tourniquet was inflated to 250 mmHg.  A longitudinal incision was made over the anterior ankle over the extensor houses longus tendon.  The dissection was carried down through the subcutaneous tissues.  The extensor retinaculum was incised over the EHL and released proximally and distally.  The interval between the  tibialis anterior and extensor houses longus was then developed taking care to protect the neurovascular bundle.  The anterior joint capsule was elevated sharply medially and laterally.  The oscillating saw was used to make relieving cuts in the medial and lateral gutters.  Osteophytes were then removed with a curved osteotome.  A guidepin was then inserted at the tibial tubercle percutaneously in line with the sword in the medial gutter.  The external alignment guide was then applied and seated in line with the tibial crest.  An AP radiograph confirmed appropriate alignment.  The proximal hole in the guide was then drilled and provisionally pinned with rotation set in line with the medial sword in the flag at the cutting block.  The varus valgus alignment was set using an AP radiograph.  And the cutting block was pinned proximally.  Lateral view was then obtained.  The angel wing was used to set the slope and resection height appropriately.  The distal portion of the cutting block was then pinned.  An AP view was obtained in the medial lateral position was set.  The block was then pinned.  The reciprocating saw was used to cut the medial gutter.  The oscillating saw was used to cut the distal tibia.  Care was taken to protect soft tissue structures posterior to the ankle.  Cutting block was then removed.  The distal bone was morselized and removed piecemeal.  The talar cutting guide was then applied and tensioned appropriately.  A lateral view was obtained showing appropriate alignment of the talar cut.  The block was pinned and soft tissues protected.  The oscillating saw was used to cut the proximal talar cut.  Bone was removed.  The tibia was measured as a size 3.  The size 3 talar lollipop was applied.  A radiograph showed appropriate position.  The lollipop was pinned and then removed.  The talar cutting guide was applied and securely pinned.  The 4 talar cuts were then made and the cutting guide removed.  The  anterior bone was removed with a rondure.  The cut surfaces of bone were smoothed with a curved rasp.  The wound was then irrigated copiously.  The talar trial implant was inserted and was noted to fit appropriately.  It was secured with a screw.  The tibial trial was then inserted.  A lateral radiograph confirmed appropriate depth of the trial.  The trial was pinned.  Trial polywas inserted.  Rotation was set on the talar component and the lug holes drilled.  The talar trial was removed along with the polyspacer.  The 3 peripheral peg holes were punched in the tibial trial.  The central hole was then punched.  The wound was irrigated copiously take care to remove all bone debris.  Vancomycin powder was sprinkled on the talar implant and it was impacted in position.  The protective sleeve was then inserted and the tibial implant inserted after sprinkling with vancomycin powder.  It was impacted in position.  A 7 mm trial polywas inserted.  The deltoid ligament was released medially.  Medial and lateral balance was then appropriate.  The polyspacer was removed and replaced with the final 7 mm polyethylene implant.  It was inserted and then the locking clip inserted.  AP, mortise and lateral radiographs confirmed appropriate position of all hardware and appropriate alignment of the ankle replacement implants.  Medial and lateral gutters were cleaned of soft tissues and impinging bone.  The wound was irrigated copiously and sprinkled with vancomycin powder.  The anterior capsule was repaired with 0 Vicryl.  The extensor retinaculum was repaired with 0 Vicryl.  Subcutaneous tissues were approximated with Monocryl.  The skin was closed with a running 3-0 nylon.  Approximately the guidepin was removed and the wound irrigated and closed with nylon.  The ankle was noted to be tight in dorsiflexion.  A percutaneous tendo Achilles lengthening was performed.  This allowed 20 degrees of dorsiflexion with the knee extended.   Sterile dressings were applied followed by a well-padded short leg splint.  The tourniquet was released after application of the dressings.  The patient was awakened from anesthesia and transported to the recovery room in stable condition.   FOLLOW UP PLAN: Nonweightbearing on the left lower extremity.  Aspirin for DVT prophylaxis.  Follow-up with me in the office in 3 weeks for suture removal and conversion to a cam boot if the soft tissues are looking healthy.      Mechele Claude PA-C was present and scrubbed for the duration of the operative case. His assistance was essential in positioning the patient, prepping and draping, gaining and maintaining exposure, performing the operation, closing and dressing the wounds and applying the splint.

## 2020-12-31 NOTE — Transfer of Care (Signed)
Immediate Anesthesia Transfer of Care Note  Patient: Alexander Castillo  Procedure(s) Performed: Left total ankle replacement (Left Ankle) Achilles Tendon Lengthening (Left Leg Lower)  Patient Location: PACU  Anesthesia Type:GA combined with regional for post-op pain  Level of Consciousness: drowsy and patient cooperative  Airway & Oxygen Therapy: Patient Spontanous Breathing and Patient connected to face mask oxygen  Post-op Assessment: Report given to RN and Post -op Vital signs reviewed and stable  Post vital signs: Reviewed and stable  Last Vitals:  Vitals Value Taken Time  BP    Temp    Pulse 67 12/31/20 0936  Resp    SpO2 97 % 12/31/20 0936  Vitals shown include unvalidated device data.  Last Pain:  Vitals:   12/31/20 0647  TempSrc: Oral  PainSc: 0-No pain         Complications: No complications documented.

## 2020-12-31 NOTE — Progress Notes (Signed)
Assisted Dr. Rob Fitzgerald with left, ultrasound guided, popliteal/saphenous block. Side rails up, monitors on throughout procedure. See vital signs in flow sheet. Tolerated Procedure well. 

## 2020-12-31 NOTE — Anesthesia Postprocedure Evaluation (Signed)
Anesthesia Post Note  Patient: Alexander Castillo  Procedure(s) Performed: Left total ankle replacement (Left Ankle) Percutaneous Achilles Tendon Lengthening (Left Leg Lower)     Patient location during evaluation: PACU Anesthesia Type: General Level of consciousness: sedated Pain management: pain level controlled Vital Signs Assessment: post-procedure vital signs reviewed and stable Respiratory status: spontaneous breathing and respiratory function stable Cardiovascular status: stable Postop Assessment: no apparent nausea or vomiting Anesthetic complications: no   No complications documented.  Last Vitals:  Vitals:   12/31/20 1114 12/31/20 1115  BP:    Pulse:    Resp:    Temp:    SpO2: 90% 99%    Last Pain:  Vitals:   12/31/20 1105  TempSrc:   PainSc: 0-No pain                 Brian Kocourek DANIEL

## 2020-12-31 NOTE — Anesthesia Procedure Notes (Signed)
Procedure Name: LMA Insertion Date/Time: 12/31/2020 7:35 AM Performed by: Signe Colt, CRNA Pre-anesthesia Checklist: Patient identified, Emergency Drugs available, Suction available and Patient being monitored Patient Re-evaluated:Patient Re-evaluated prior to induction Oxygen Delivery Method: Circle System Utilized Preoxygenation: Pre-oxygenation with 100% oxygen Induction Type: IV induction Ventilation: Mask ventilation without difficulty LMA: LMA inserted LMA Size: 4.0 Number of attempts: 1 Airway Equipment and Method: bite block Placement Confirmation: positive ETCO2 Tube secured with: Tape Dental Injury: Teeth and Oropharynx as per pre-operative assessment

## 2020-12-31 NOTE — Discharge Instructions (Addendum)
Alexander Simmer, MD [facility]  Please read the following information regarding your care after surgery.  Medications  You only need a prescription for the narcotic pain medicine (ex. oxycodone, Percocet, Norco).  All of the other medicines listed below are available over the counter. X Aleve 2 pills twice a day for the first 3 days after surgery. X acetominophen (Tylenol) 650 mg every 4-6 hours as you need for minor to moderate pain X oxycodone as prescribed for severe pain  Narcotic pain medicine (ex. oxycodone, Percocet, Vicodin) will cause constipation.  To prevent this problem, take the following medicines while you are taking any pain medicine. X docusate sodium (Colace) 100 mg twice a day X senna (Senokot) 2 tablets twice a day  X To help prevent blood clots, take a baby aspirin (81 mg) twice a day for 3 weeks after surgery.  You should also get up every hour while you are awake to move around.    Weight Bearing X Do not bear any weight on the operated leg or foot.  Cast / Splint / Dressing X Keep your splint, cast or dressing clean and dry.  Don't put anything (coat hanger, pencil, etc) down inside of it.  If it gets damp, use a hair dryer on the cool setting to dry it.  If it gets soaked, call the office to schedule an appointment for a cast change.   After your dressing, cast or splint is removed; you may shower, but do not soak or scrub the wound.  Allow the water to run over it, and then gently pat it dry.  Swelling It is normal for you to have swelling where you had surgery.  To reduce swelling and pain, keep your toes above your nose for at least 3 days after surgery.  It may be necessary to keep your foot or leg elevated for several weeks.  If it hurts, it should be elevated.  Follow Up Call my office at [phone] when you are discharged from the hospital or surgery center to schedule an appointment to be seen two weeks after surgery.  Call my office at [phone] if  you develop a fever >101.5 F, nausea, vomiting, bleeding from the surgical site or severe pain.     Next dose of Tylenol can be given at 1:00pm if needed. Next dose of NSAIDS (Ibuprofen/Motrin/Aleve) can be given at 3:00pm if needed.  Post Anesthesia Home Care Instructions  Activity: Get plenty of rest for the remainder of the day. A responsible individual must stay with you for 24 hours following the procedure.  For the next 24 hours, DO NOT: -Drive a car -Paediatric nurse -Drink alcoholic beverages -Take any medication unless instructed by your physician -Make any legal decisions or sign important papers.  Meals: Start with liquid foods such as gelatin or soup. Progress to regular foods as tolerated. Avoid greasy, spicy, heavy foods. If nausea and/or vomiting occur, drink only clear liquids until the nausea and/or vomiting subsides. Call your physician if vomiting continues.  Special Instructions/Symptoms: Your throat may feel dry or sore from the anesthesia or the breathing tube placed in your throat during surgery. If this causes discomfort, gargle with warm salt water. The discomfort should disappear within 24 hours.      Regional Anesthesia Blocks  1. Numbness or the inability to move the "blocked" extremity may last from 3-48 hours after placement. The length of time depends on the medication injected and your individual response to the medication. If the numbness  is not going away after 48 hours, call your surgeon.  2. The extremity that is blocked will need to be protected until the numbness is gone and the  Strength has returned. Because you cannot feel it, you will need to take extra care to avoid injury. Because it may be weak, you may have difficulty moving it or using it. You may not know what position it is in without looking at it while the block is in effect.  3. For blocks in the legs and feet, returning to weight bearing and walking needs to be done carefully. You  will need to wait until the numbness is entirely gone and the strength has returned. You should be able to move your leg and foot normally before you try and bear weight or walk. You will need someone to be with you when you first try to ensure you do not fall and possibly risk injury.  4. Bruising and tenderness at the needle site are common side effects and will resolve in a few days.  5. Persistent numbness or new problems with movement should be communicated to the surgeon or the [facility] [phone] [place] [phone]).  Information for Discharge Teaching: EXPAREL (bupivacaine liposome injectable suspension)   Your surgeon or anesthesiologist gave you EXPAREL(bupivacaine) to help control your pain after surgery.   EXPAREL is a local anesthetic that provides pain relief by numbing the tissue around the surgical site.  EXPAREL is designed to release pain medication over time and can control pain for up to 72 hours.  Depending on how you respond to EXPAREL, you may require less pain medication during your recovery.  Possible side effects:  Temporary loss of sensation or ability to move in the area where bupivacaine was injected.  Nausea, vomiting, constipation  Rarely, numbness and tingling in your mouth or lips, lightheadedness, or anxiety may occur.  Call your doctor right away if you think you may be experiencing any of these sensations, or if you have other questions regarding possible side effects.  Follow all other discharge instructions given to you by your surgeon or nurse. Eat a healthy diet and drink plenty of water or other fluids.  If you return to the hospital for any reason within 96 hours following the administration of EXPAREL, it is important for health care providers to know that you have received this anesthetic. A teal colored band has been placed on your arm with the date, time and amount of EXPAREL you have received in order  to alert and inform your health care providers. Please leave this armband in place for the full 96 hours following administration, and then you may remove the band.

## 2020-12-31 NOTE — Anesthesia Procedure Notes (Signed)
Anesthesia Regional Block: Adductor canal block   Pre-Anesthetic Checklist: ,, timeout performed, Correct Patient, Correct Site, Correct Laterality, Correct Procedure, Correct Position, site marked, Risks and benefits discussed,  Surgical consent,  Pre-op evaluation,  At surgeon's request and post-op pain management  Laterality: Left  Prep: chloraprep       Needles:  Injection technique: Single-shot  Needle Type: Echogenic Needle     Needle Length: 9cm  Needle Gauge: 21     Additional Needles:   Procedures:,,,, ultrasound used (permanent image in chart),,,,  Narrative:  Start time: 12/31/2020 7:07 AM End time: 12/31/2020 7:11 AM Injection made incrementally with aspirations every 5 mL.  Performed by: Personally  Anesthesiologist: Suzette Battiest, MD

## 2020-12-31 NOTE — Anesthesia Procedure Notes (Signed)
Anesthesia Regional Block: Popliteal block   Pre-Anesthetic Checklist: ,, timeout performed, Correct Patient, Correct Site, Correct Laterality, Correct Procedure, Correct Position, site marked, Risks and benefits discussed,  Surgical consent,  Pre-op evaluation,  At surgeon's request and post-op pain management  Laterality: Left  Prep: chloraprep       Needles:  Injection technique: Single-shot  Needle Type: Echogenic Needle     Needle Length: 9cm  Needle Gauge: 21     Additional Needles:   Procedures:,,,, ultrasound used (permanent image in chart),,,,  Narrative:  Start time: 12/31/2020 7:00 AM End time: 12/31/2020 7:07 AM Injection made incrementally with aspirations every 5 mL.  Performed by: Personally  Anesthesiologist: Suzette Battiest, MD

## 2020-12-31 NOTE — H&P (Signed)
Alexander Castillo is an 80 y.o. male.   Chief Complaint: left ankle pain HPI: The patient is an 80 year old male who has a long history of left ankle pain.  He has posttraumatic arthritis and has failed nonoperative treatment to date.  He presents now for left total ankle replacement and possible heel cord lengthening.  Past Medical History:  Diagnosis Date  . BENIGN PROSTATIC HYPERTROPHY 03/10/2008  . BENIGN PROSTATIC HYPERTROPHY, HX OF 03/10/2008  . CHEST PAIN 03/10/2008   at pre-op 01-17-18: per patient had cardiac eval at that time was sent for test (uaware type) ; reports nothing was abnormal; no recurrence of chest pain   . DEGENERATIVE JOINT DISEASE 03/10/2008  . EAR PAIN, LEFT 12/09/2008  . HYPERLIPIDEMIA 03/10/2008  . Osteoarthritis    left ankle  . PSA, INCREASED 03/16/2009  . RASH-NONVESICULAR 03/16/2009    Past Surgical History:  Procedure Laterality Date  . back surgury  1998   lower  . EYE SURGERY Bilateral    lens replacement for cataracts  . hip replacement left Left 2002  . JOINT REPLACEMENT Right    knee 2019, left hip 2017  . s/p left knee arthroscopy  yrs ago  . TOTAL HIP ARTHROPLASTY Right 06/13/2016   Procedure: RIGHT TOTAL HIP ARTHROPLASTY ANTERIOR APPROACH;  Surgeon: Paralee Cancel, MD;  Location: [facility];  Service: Orthopedics;  Laterality: Right;  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  . TOTAL KNEE ARTHROPLASTY Right 01/23/2018   Procedure: RIGHT TOTAL KNEE ARTHROPLASTY;  Surgeon: Paralee Cancel, MD;  Location: [facility];  Service: Orthopedics;  Laterality: Right;  70 mins  . transurethral resection of prostate      Family History  Problem Relation Age of Onset  . Dementia Mother    Social History:  reports that he has never smoked. He has quit using smokeless tobacco. He reports that he does not drink alcohol and does not use drugs.  Allergies: No Known Allergies  No medications prior to admission.    Results for orders placed or performed during the hospital encounter of  12/31/20 (from the past 48 hour(s))  Basic metabolic panel per protocol     Status: Abnormal   Collection Time: 12/29/20 10:14 AM  Result Value Ref Range   Sodium 138 135 - 145 mmol/L   Potassium 4.3 3.5 - 5.1 mmol/L   Chloride 102 98 - 111 mmol/L   CO2 28 22 - 32 mmol/L   Glucose, Bld 79 70 - 99 mg/dL    Comment: Glucose reference range applies only to samples taken after fasting for at least 8 hours.   BUN 15 8 - 23 mg/dL   Creatinine, Ser 1.01 0.61 - 1.24 mg/dL   Calcium 8.6 (L) 8.9 - 10.3 mg/dL   GFR, Estimated >60 >60 mL/min    Comment: (NOTE) Calculated using the CKD-EPI Creatinine Equation (2021)    Anion gap 8 5 - 15    Comment: Performed at [facility] [address], [place], [place] [address]  CBC per protocol     Status: None   Collection Time: 12/29/20 10:14 AM  Result Value Ref Range   WBC 4.8 4.0 - 10.5 K/uL   RBC 4.22 4.22 - 5.81 MIL/uL   Hemoglobin 13.3 13.0 - 17.0 g/dL   HCT 40.8 39.0 - 52.0 %   MCV 96.7 80.0 - 100.0 fL   MCH 31.5 26.0 - 34.0 pg   MCHC 32.6 30.0 - 36.0 g/dL   RDW 13.9 11.5 - 15.5 %  Platelets 170 150 - 400 K/uL   nRBC 0.0 0.0 - 0.2 %    Comment: Performed at [facility], [place] [address], [place], [place] [address]  Surgical pcr screen     Status: None   Collection Time: 12/29/20 10:14 AM   Specimen: Nasal Mucosa; Nasal Swab  Result Value Ref Range   MRSA, PCR NEGATIVE NEGATIVE   Staphylococcus aureus NEGATIVE NEGATIVE    Comment: (NOTE) The Xpert SA Assay (FDA approved for NASAL specimens in patients 46 years of age and older), is one component of a comprehensive surveillance program. It is not intended to diagnose infection nor to guide or monitor treatment. Performed at [facility], [place] [address], [place], [place] [address]    No results found.  Review of Systems no recent fever, chills, nausea, vomiting or changes in his appetite  Blood pressure 128/63, pulse (!) 56, temperature 98.6 F (37 C),  temperature source Oral, resp. rate 17, height 5\' 9"  (1.753 m), weight 77.6 kg, SpO2 100 %. Physical Exam  Well-nourished well-developed man in no apparent distress.  Alert and oriented x4.  Normal mood and affect.  Gait is antalgic on the left.  Left ankle has healthy skin and palpable pulses.  No lymphadenopathy.  Intact sensibility to light touch dorsally and plantarly at the forefoot.  5 out of 5 strength in plantarflexion and dorsiflexion of the ankle and toes.   Assessment/Plan Left ankle arthritis -to the operating room today for left total ankle replacement and possible heel cord lengthening.  The risks and benefits of the alternative treatment options have been discussed in detail.  The patient wishes to proceed with surgery and specifically understands risks of bleeding, infection, nerve damage, blood clots, need for additional surgery, amputation and death.   Wylene Simmer, MD 01/29/2021, 7:22 AM

## 2021-01-01 ENCOUNTER — Encounter (HOSPITAL_BASED_OUTPATIENT_CLINIC_OR_DEPARTMENT_OTHER): Payer: Self-pay | Admitting: Orthopedic Surgery

## 2021-01-21 DIAGNOSIS — M19072 Primary osteoarthritis, left ankle and foot: Secondary | ICD-10-CM | POA: Diagnosis not present

## 2021-02-12 ENCOUNTER — Encounter (INDEPENDENT_AMBULATORY_CARE_PROVIDER_SITE_OTHER): Payer: Medicare Other | Admitting: Ophthalmology

## 2021-02-12 ENCOUNTER — Other Ambulatory Visit: Payer: Self-pay

## 2021-02-12 DIAGNOSIS — H4312 Vitreous hemorrhage, left eye: Secondary | ICD-10-CM | POA: Diagnosis not present

## 2021-02-12 DIAGNOSIS — H31091 Other chorioretinal scars, right eye: Secondary | ICD-10-CM | POA: Diagnosis not present

## 2021-02-12 DIAGNOSIS — H43813 Vitreous degeneration, bilateral: Secondary | ICD-10-CM

## 2021-02-12 DIAGNOSIS — H26493 Other secondary cataract, bilateral: Secondary | ICD-10-CM | POA: Diagnosis not present

## 2021-02-12 DIAGNOSIS — H33301 Unspecified retinal break, right eye: Secondary | ICD-10-CM

## 2021-02-12 DIAGNOSIS — H353111 Nonexudative age-related macular degeneration, right eye, early dry stage: Secondary | ICD-10-CM | POA: Diagnosis not present

## 2021-02-12 DIAGNOSIS — M19072 Primary osteoarthritis, left ankle and foot: Secondary | ICD-10-CM | POA: Diagnosis not present

## 2021-02-12 DIAGNOSIS — H43811 Vitreous degeneration, right eye: Secondary | ICD-10-CM | POA: Diagnosis not present

## 2021-02-21 IMAGING — RF DG ANKLE COMPLETE 3+V*L*
1 series · 3 of 3 positions shown · non-contrast
Comparison: None.

CLINICAL DATA: Total ankle replacement.

EXAM:
DG C-ARM 1-60 MIN; LEFT ANKLE COMPLETE - 3+ VIEW
FLUOROSCOPY TIME:  Fluoroscopy Time:  1 minutes 6 seconds.
Reported radiation: 2.45 mGy.

[Series 1: run · 3 of 3 slices shown]
[im 1/3]
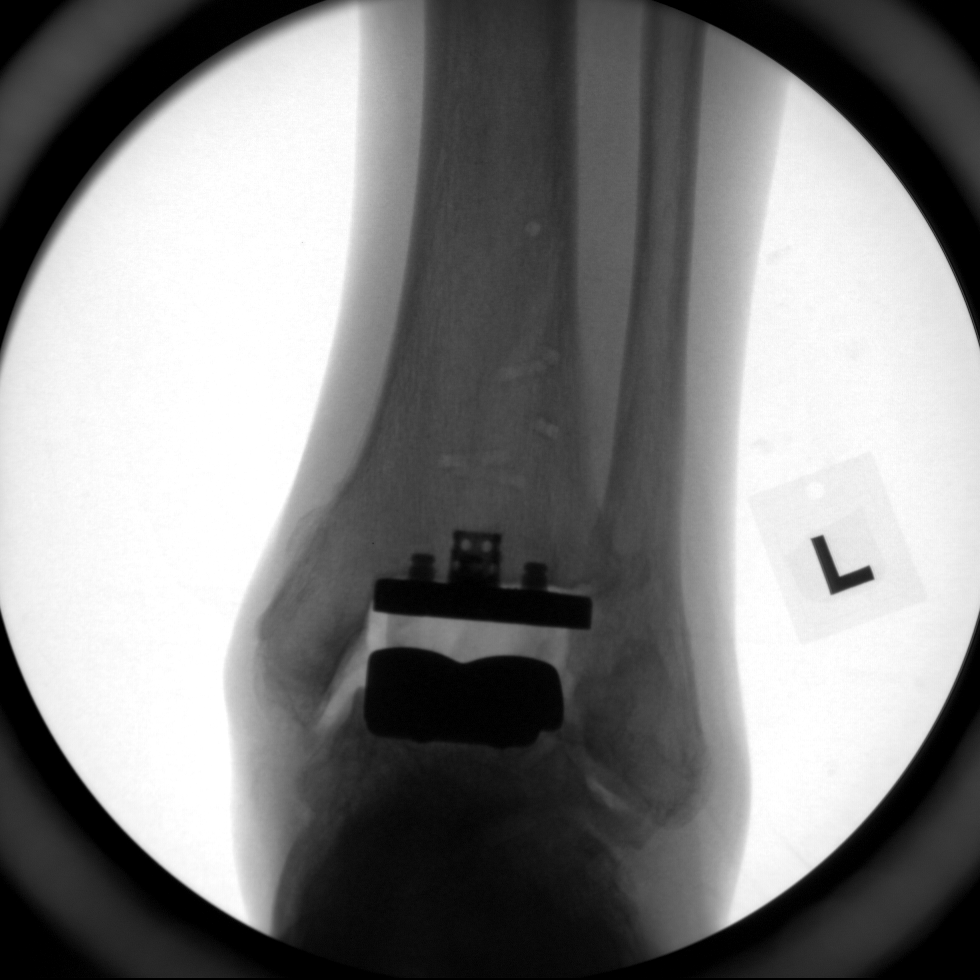
[im 2/3]
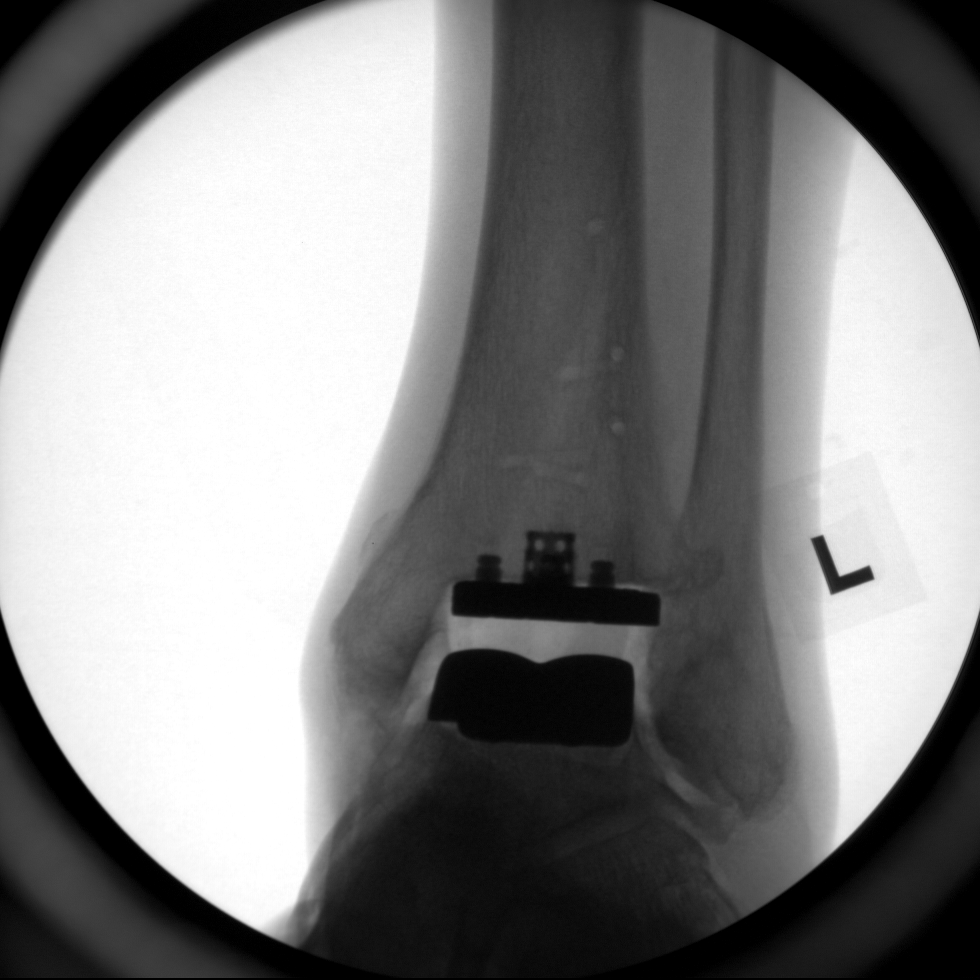
[im 3/3]
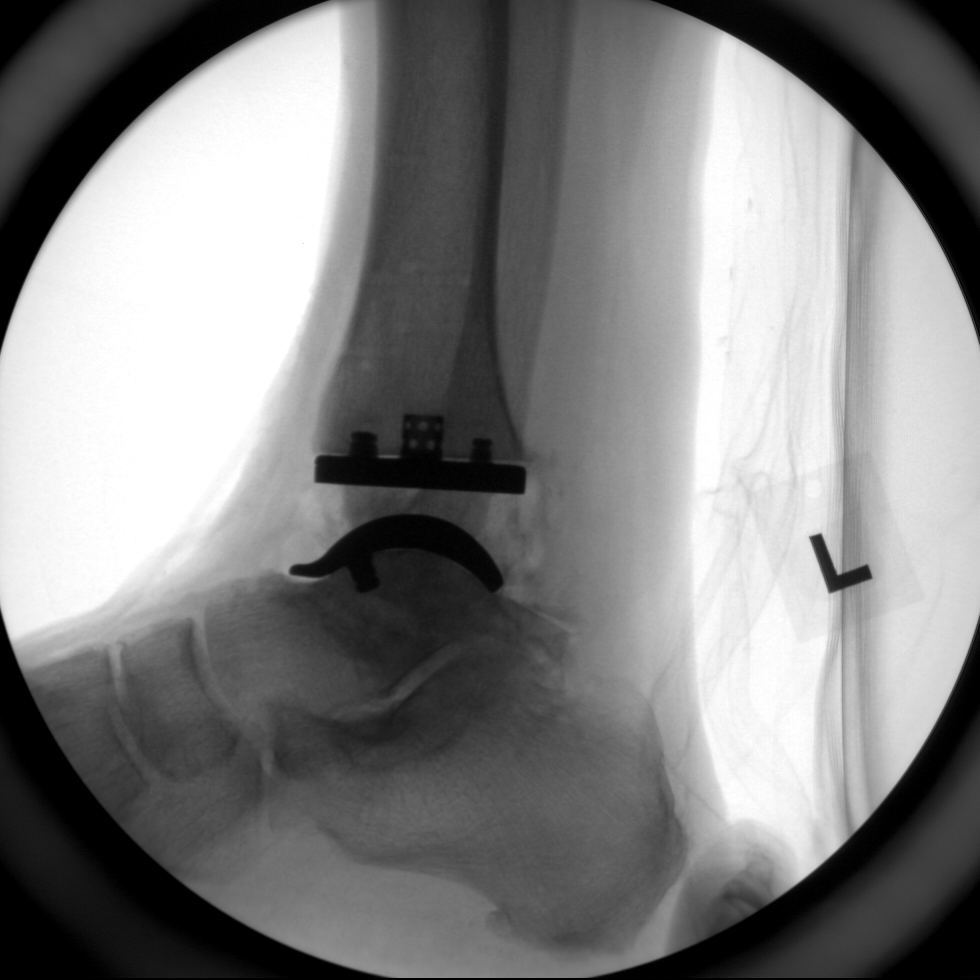

[3 of 3 positions shown; findings below may reference images not displayed]

FINDINGS: Three C-arm fluoroscopic images were obtained intraoperatively and
submitted for post operative interpretation. These images
demonstrate postsurgical changes of total ankle replacement. No
unexpected findings. Please see the performing provider's procedural
report for further detail.
IMPRESSION: Intraoperative fluoroscopic imaging, as detailed above.

## 2021-02-21 IMAGING — RF DG C-ARM 1-60 MIN
1 series · 3 of 3 positions shown · non-contrast
Comparison: None.

CLINICAL DATA: Total ankle replacement.

EXAM:
DG C-ARM 1-60 MIN; LEFT ANKLE COMPLETE - 3+ VIEW
FLUOROSCOPY TIME:  Fluoroscopy Time:  1 minutes 6 seconds.
Reported radiation: 2.45 mGy.

[Series 1: run · 3 of 3 slices shown]
[im 1/3]
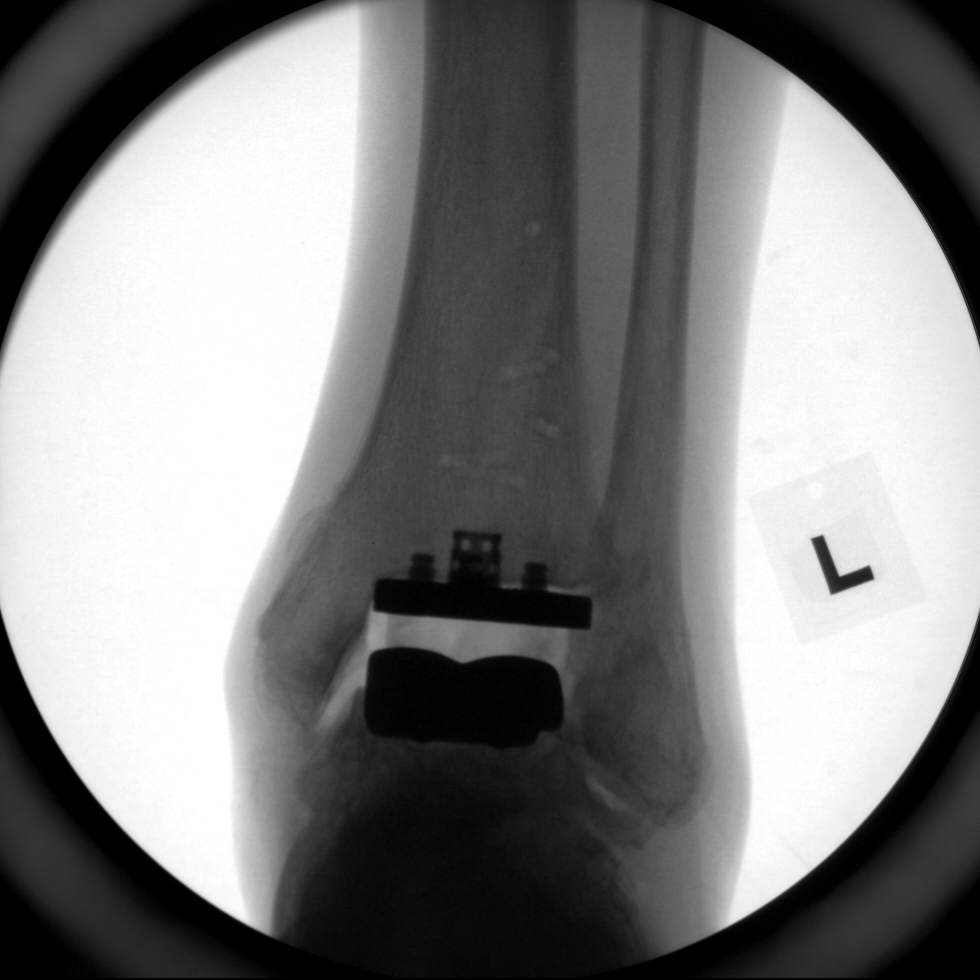
[im 2/3]
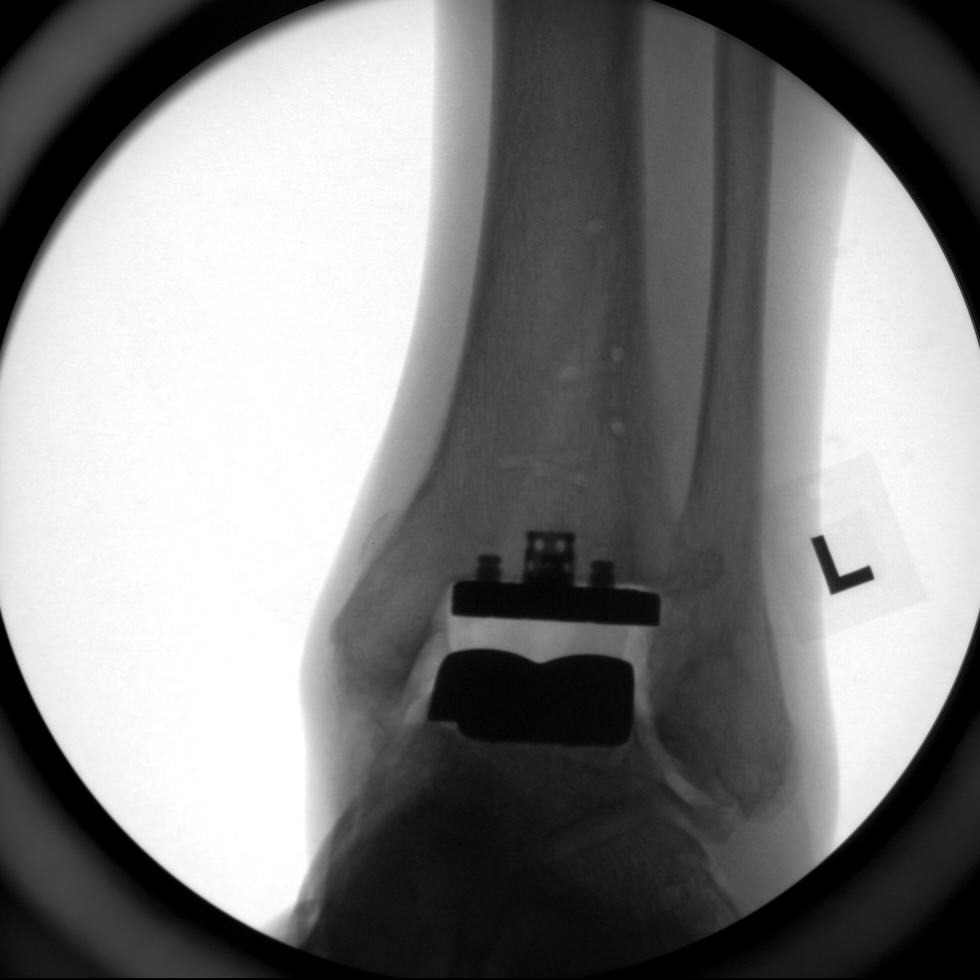
[im 3/3]
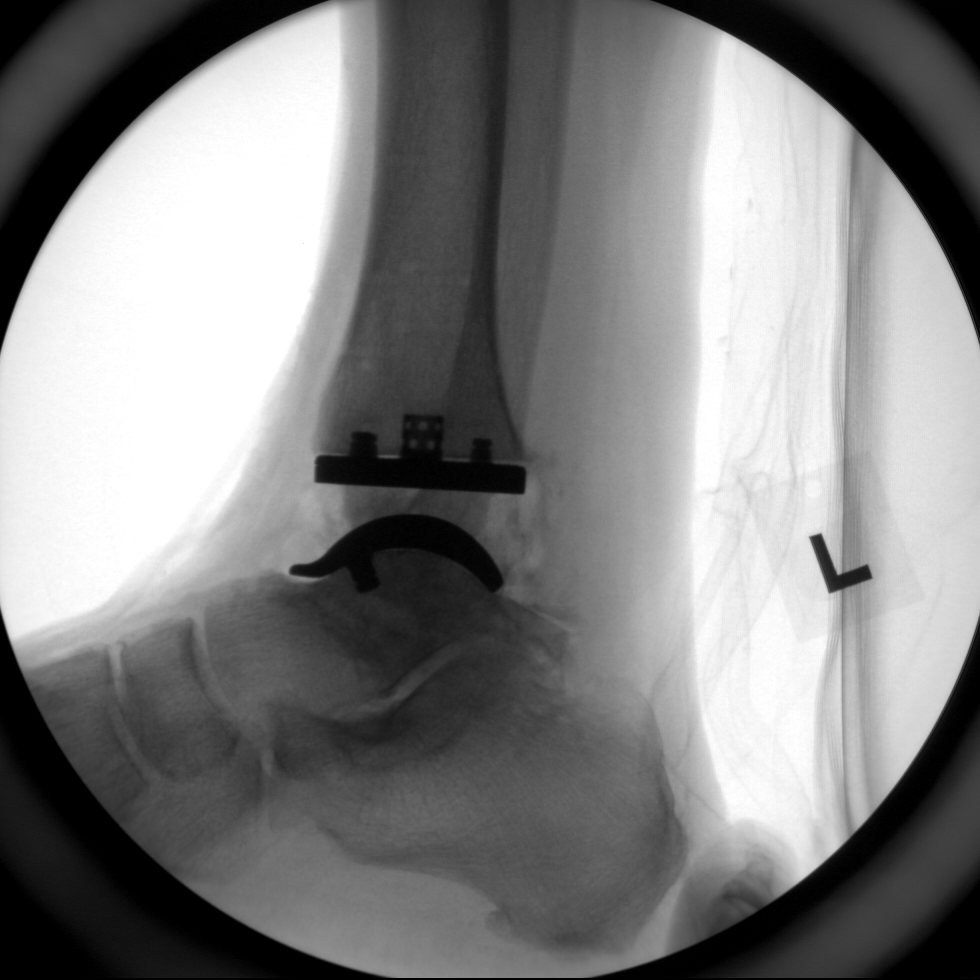

[3 of 3 positions shown; findings below may reference images not displayed]

FINDINGS: Three C-arm fluoroscopic images were obtained intraoperatively and
submitted for post operative interpretation. These images
demonstrate postsurgical changes of total ankle replacement. No
unexpected findings. Please see the performing provider's procedural
report for further detail.
IMPRESSION: Intraoperative fluoroscopic imaging, as detailed above.

## 2021-02-23 DIAGNOSIS — M25572 Pain in left ankle and joints of left foot: Secondary | ICD-10-CM | POA: Diagnosis not present

## 2021-03-03 DIAGNOSIS — M25572 Pain in left ankle and joints of left foot: Secondary | ICD-10-CM | POA: Diagnosis not present

## 2021-03-15 ENCOUNTER — Encounter (INDEPENDENT_AMBULATORY_CARE_PROVIDER_SITE_OTHER): Payer: Medicare Other | Admitting: Ophthalmology

## 2021-03-15 ENCOUNTER — Other Ambulatory Visit: Payer: Self-pay

## 2021-03-15 DIAGNOSIS — H33301 Unspecified retinal break, right eye: Secondary | ICD-10-CM | POA: Diagnosis not present

## 2021-03-15 DIAGNOSIS — H43813 Vitreous degeneration, bilateral: Secondary | ICD-10-CM

## 2021-03-15 DIAGNOSIS — H35033 Hypertensive retinopathy, bilateral: Secondary | ICD-10-CM | POA: Diagnosis not present

## 2021-03-15 DIAGNOSIS — I10 Essential (primary) hypertension: Secondary | ICD-10-CM | POA: Diagnosis not present

## 2021-05-27 ENCOUNTER — Telehealth: Payer: Self-pay | Admitting: Internal Medicine

## 2021-05-27 NOTE — Telephone Encounter (Signed)
LVM for pt to rtn my call to schedule AWV with [facility]. Please schedule this appt if pt calls the office.  °

## 2021-06-11 ENCOUNTER — Ambulatory Visit (INDEPENDENT_AMBULATORY_CARE_PROVIDER_SITE_OTHER): Payer: Medicare Other

## 2021-06-11 DIAGNOSIS — Z0000 Encounter for general adult medical examination without abnormal findings: Secondary | ICD-10-CM | POA: Diagnosis not present

## 2021-06-11 NOTE — Patient Instructions (Signed)
Alexander Castillo , Thank you for taking time to come for your Medicare Wellness Visit. I appreciate your ongoing commitment to your health goals. Please review the following plan we discussed and let me know if I can assist you in the future.   Screening recommendations/referrals: Colonoscopy: no repeat due to age Recommended yearly ophthalmology/optometry visit for glaucoma screening and checkup Recommended yearly dental visit for hygiene and checkup  Vaccinations: Influenza vaccine: 08/13/2020 Pneumococcal vaccine: 03/16/2009, 07/03/2014 Tdap vaccine: 07/16/2018; due every 10 years Shingles vaccine: 06/09/2021   Covid-19: 12/28/2019, 01/16/2020, 10/09/2020  Advanced directives: Please bring a copy of your health care power of attorney and living will to the office at your convenience.  Conditions/risks identified: Yes.  Goals: Recommend to drink at least 6-8 8oz glasses of water per day.   Recommend to exercise for at least 150 minutes per week.   Recommend to remove any items from the home that may cause slips or trips.   Recommend to decrease portion sizes by eating 3 small healthy meals and at least 2 healthy snacks per day.   Recommend to begin DASH diet as directed below   Next appointment: Please schedule your next Medicare Wellness Visit with your Nurse Health Advisor in 1 year by calling [phone].  Preventive Care 65 Years and Older, Male Preventive care refers to lifestyle choices and visits with your health care provider that can promote health and wellness. What does preventive care include? A yearly physical exam. This is also called an annual well check. Dental exams once or twice a year. Routine eye exams. Ask your health care provider how often you should have your eyes checked. Personal lifestyle choices, including: Daily care of your teeth and gums. Regular physical activity. Eating a healthy diet. Avoiding tobacco and drug use. Limiting alcohol use. Practicing  safe sex. Taking low doses of aspirin every day. Taking vitamin and mineral supplements as recommended by your health care provider. What happens during an annual well check? The services and screenings done by your health care provider during your annual well check will depend on your age, overall health, lifestyle risk factors, and family history of disease. Counseling  Your health care provider may ask you questions about your: Alcohol use. Tobacco use. Drug use. Emotional well-being. Home and relationship well-being. Sexual activity. Eating habits. History of falls. Memory and ability to understand (cognition). Work and work Statistician. Screening  You may have the following tests or measurements: Height, weight, and BMI. Blood pressure. Lipid and cholesterol levels. These may be checked every 5 years, or more frequently if you are over 21 years old. Skin check. Lung cancer screening. You may have this screening every year starting at age 44 if you have a 30-pack-year history of smoking and currently smoke or have quit within the past 15 years. Fecal occult blood test (FOBT) of the stool. You may have this test every year starting at age 34. Flexible sigmoidoscopy or colonoscopy. You may have a sigmoidoscopy every 5 years or a colonoscopy every 10 years starting at age 52. Prostate cancer screening. Recommendations will vary depending on your family history and other risks. Hepatitis C blood test. Hepatitis B blood test. Sexually transmitted disease (STD) testing. Diabetes screening. This is done by checking your blood sugar (glucose) after you have not eaten for a while (fasting). You may have this done every 1-3 years. Abdominal aortic aneurysm (AAA) screening. You may need this if you are a current or former smoker. Osteoporosis. You may be  screened starting at age 85 if you are at high risk. Talk with your health care provider about your test results, treatment options, and  if necessary, the need for more tests. Vaccines  Your health care provider may recommend certain vaccines, such as: Influenza vaccine. This is recommended every year. Tetanus, diphtheria, and acellular pertussis (Tdap, Td) vaccine. You may need a Td booster every 10 years. Zoster vaccine. You may need this after age 16. Pneumococcal 13-valent conjugate (PCV13) vaccine. One dose is recommended after age 90. Pneumococcal polysaccharide (PPSV23) vaccine. One dose is recommended after age 59. Talk to your health care provider about which screenings and vaccines you need and how often you need them. This information is not intended to replace advice given to you by your health care provider. Make sure you discuss any questions you have with your health care provider. Document Released: 12/25/2015 Document Revised: 08/17/2016 Document Reviewed: 09/29/2015 Elsevier Interactive Patient Education  2017 [place] Prevention in the Home Falls can cause injuries. They can happen to people of all ages. There are many things you can do to make your home safe and to help prevent falls. What can I do on the outside of my home? Regularly fix the edges of walkways and driveways and fix any cracks. Remove anything that might make you trip as you walk through a door, such as a raised step or threshold. Trim any bushes or trees on the path to your home. Use bright outdoor lighting. Clear any walking paths of anything that might make someone trip, such as rocks or tools. Regularly check to see if handrails are loose or broken. Make sure that both sides of any steps have handrails. Any raised decks and porches should have guardrails on the edges. Have any leaves, snow, or ice cleared regularly. Use sand or salt on walking paths during winter. Clean up any spills in your garage right away. This includes oil or grease spills. What can I do in the bathroom? Use night lights. Install grab bars by the  toilet and in the tub and shower. Do not use towel bars as grab bars. Use non-skid mats or decals in the tub or shower. If you need to sit down in the shower, use a plastic, non-slip stool. Keep the floor dry. Clean up any water that spills on the floor as soon as it happens. Remove soap buildup in the tub or shower regularly. Attach bath mats securely with double-sided non-slip rug tape. Do not have throw rugs and other things on the floor that can make you trip. What can I do in the bedroom? Use night lights. Make sure that you have a light by your bed that is easy to reach. Do not use any sheets or blankets that are too big for your bed. They should not hang down onto the floor. Have a firm chair that has side arms. You can use this for support while you get dressed. Do not have throw rugs and other things on the floor that can make you trip. What can I do in the kitchen? Clean up any spills right away. Avoid walking on wet floors. Keep items that you use a lot in easy-to-reach places. If you need to reach something above you, use a strong step stool that has a grab bar. Keep electrical cords out of the way. Do not use floor polish or wax that makes floors slippery. If you must use wax, use non-skid floor wax. Do not have  throw rugs and other things on the floor that can make you trip. What can I do with my stairs? Do not leave any items on the stairs. Make sure that there are handrails on both sides of the stairs and use them. Fix handrails that are broken or loose. Make sure that handrails are as long as the stairways. Check any carpeting to make sure that it is firmly attached to the stairs. Fix any carpet that is loose or worn. Avoid having throw rugs at the top or bottom of the stairs. If you do have throw rugs, attach them to the floor with carpet tape. Make sure that you have a light switch at the top of the stairs and the bottom of the stairs. If you do not have them, ask someone  to add them for you. What else can I do to help prevent falls? Wear shoes that: Do not have high heels. Have rubber bottoms. Are comfortable and fit you well. Are closed at the toe. Do not wear sandals. If you use a stepladder: Make sure that it is fully opened. Do not climb a closed stepladder. Make sure that both sides of the stepladder are locked into place. Ask someone to hold it for you, if possible. Clearly mark and make sure that you can see: Any grab bars or handrails. First and last steps. Where the edge of each step is. Use tools that help you move around (mobility aids) if they are needed. These include: Canes. Walkers. Scooters. Crutches. Turn on the lights when you go into a dark area. Replace any light bulbs as soon as they burn out. Set up your furniture so you have a clear path. Avoid moving your furniture around. If any of your floors are uneven, fix them. If there are any pets around you, be aware of where they are. Review your medicines with your doctor. Some medicines can make you feel dizzy. This can increase your chance of falling. Ask your doctor what other things that you can do to help prevent falls. This information is not intended to replace advice given to you by your health care provider. Make sure you discuss any questions you have with your health care provider. Document Released: 09/24/2009 Document Revised: 05/05/2016 Document Reviewed: 01/02/2015 Elsevier Interactive Patient Education  2017 [employer].

## 2021-06-11 NOTE — Progress Notes (Addendum)
I connected with [name] today by telephone and verified that I am speaking with the correct person using two identifiers. Location patient: home Location provider: work Persons participating in the virtual visit: Bryen Hinderman and Lisette Abu, LPN.   I discussed the limitations, risks, security and privacy concerns of performing an evaluation and management service by telephone and the availability of in person appointments. I also discussed with the patient that there may be a patient responsible charge related to this service. The patient expressed understanding and verbally consented to this telephonic visit.    Interactive audio and video telecommunications were attempted between this provider and patient, however failed, due to patient having technical difficulties OR patient did not have access to video capability.  We continued and completed visit with audio only.  Some vital signs may be absent or patient reported.   Time Spent with patient on telephone encounter: 30 minutes  Subjective:   Alexander Castillo is a 80 y.o. male who presents for Medicare Annual/Subsequent preventive examination.  Review of Systems     Cardiac Risk Factors include: advanced age (>16men, >64 women);male gender     Objective:    There were no vitals filed for this visit. There is no height or weight on file to calculate BMI.  Advanced Directives 06/11/2021 12/31/2020 12/25/2020 06/10/2020 01/11/2019 01/23/2018 01/23/2018  Does Patient Have a Medical Advance Directive? Yes Yes Yes Yes Yes Yes Yes  Type of Paramedic of [place];Living will [place];Living will [place];Living will [place];Living will [place];Living will Living will Living will  Does patient want to make changes to medical advance directive? No - Patient declined No - Patient declined No - Patient declined No - Patient  declined - No - Patient declined No - Patient declined  Copy of [place] in Chart? No - copy requested No - copy requested No - copy requested No - copy requested No - copy requested - -    Current Medications (verified) Outpatient Encounter Medications as of 06/11/2021  Medication Sig   aspirin EC 81 MG tablet Take 1 tablet (81 mg total) by mouth 2 (two) times daily. (Patient not taking: Reported on 06/11/2021)   docusate sodium (COLACE) 100 MG capsule Take 1 capsule (100 mg total) by mouth 2 (two) times daily. While taking narcotic pain medicine. (Patient not taking: Reported on 06/11/2021)   senna (SENOKOT) 8.6 MG TABS tablet Take 2 tablets (17.2 mg total) by mouth 2 (two) times daily. (Patient not taking: Reported on 06/11/2021)   No facility-administered encounter medications on file as of 06/11/2021.    Allergies (verified) Patient has no known allergies.   History: Past Medical History:  Diagnosis Date   BENIGN PROSTATIC HYPERTROPHY 03/10/2008   BENIGN PROSTATIC HYPERTROPHY, HX OF 03/10/2008   CHEST PAIN 03/10/2008   at pre-op 01-17-18: per patient had cardiac eval at that time was sent for test (uaware type) ; reports nothing was abnormal; no recurrence of chest pain    DEGENERATIVE JOINT DISEASE 03/10/2008   EAR PAIN, LEFT 12/09/2008   HYPERLIPIDEMIA 03/10/2008   Osteoarthritis    left ankle   PSA, INCREASED 03/16/2009   RASH-NONVESICULAR 03/16/2009   Past Surgical History:  Procedure Laterality Date   ACHILLES TENDON LENGTHENING Left 12/31/2020   Procedure: Percutaneous Achilles Tendon Lengthening;  Surgeon: Wylene Simmer, MD;  Location: [facility];  Service: Orthopedics;  Laterality: Left;   back surgury  1998   lower   EYE SURGERY Bilateral    lens replacement for cataracts   hip replacement left Left 2002   JOINT REPLACEMENT Right    knee 2019, left hip 2017   s/p left knee arthroscopy  yrs ago   TOTAL ANKLE ARTHROPLASTY Left 12/31/2020    Procedure: Left total ankle replacement;  Surgeon: Wylene Simmer, MD;  Location: [facility];  Service: Orthopedics;  Laterality: Left;   TOTAL HIP ARTHROPLASTY Right 06/13/2016   Procedure: RIGHT TOTAL HIP ARTHROPLASTY ANTERIOR APPROACH;  Surgeon: Paralee Cancel, MD;  Location: [facility];  Service: Orthopedics;  Laterality: Right;  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   TOTAL KNEE ARTHROPLASTY Right 01/23/2018   Procedure: RIGHT TOTAL KNEE ARTHROPLASTY;  Surgeon: Paralee Cancel, MD;  Location: [facility];  Service: Orthopedics;  Laterality: Right;  70 mins   transurethral resection of prostate     Family History  Problem Relation Age of Onset   Dementia Mother    Social History   Socioeconomic History   Marital status: Widowed    Spouse name: Not on file   Number of children: 2   Years of education: Not on file   Highest education level: Not on file  Occupational History   Occupation: retired Chief Strategy Officer   Tobacco Use   Smoking status: Never   Smokeless tobacco: Former  Scientific laboratory technician Use: Never used  Substance and Sexual Activity   Alcohol use: No   Drug use: No   Sexual activity: Not Currently  Other Topics Concern   Not on file  Social History Narrative   Not on file   Social Determinants of Health   Financial Resource Strain: Low Risk    Difficulty of Paying Living Expenses: Not hard at all  Food Insecurity: No Food Insecurity   Worried About Charity fundraiser in the Last Year: Never true   Arboriculturist in the Last Year: Never true  Transportation Needs: No Transportation Needs   Lack of Transportation (Medical): No   Lack of Transportation (Non-Medical): No  Physical Activity: Sufficiently Active   Days of Exercise per Week: 5 days   Minutes of Exercise per Session: 30 min  Stress: No Stress Concern Present   Feeling of Stress : Not at all  Social Connections: Socially Isolated   Frequency of Communication with Friends and Family: More than three times a week    Frequency of Social Gatherings with Friends and Family: Once a week   Attends Religious Services: Never   Marine scientist or Organizations: No   Attends Archivist Meetings: Never   Marital Status: Widowed    Tobacco Counseling Counseling given: Not Answered   Clinical Intake:  Pre-visit preparation completed: Yes  Pain : No/denies pain     Nutritional Risks: None Diabetes: No  How often do you need to have someone help you when you read instructions, pamphlets, or other written materials from your doctor or pharmacy?: 1 - Never What is the last grade level you completed in school?: 12th grade; [employer]  Diabetic? no  Interpreter Needed?: No  Information entered by :: Lisette Abu, LPN   Activities of Daily Living In your present state of health, do you have any difficulty performing the following activities: 06/11/2021 12/31/2020  Hearing? N N  Vision? N N  Difficulty concentrating or making decisions? N N  Walking or climbing stairs? N N  Dressing or bathing? N N  Doing  errands, shopping? N -  Preparing Food and eating ? N -  Using the Toilet? N -  In the past six months, have you accidently leaked urine? N -  Do you have problems with loss of bowel control? N -  Managing your Medications? N -  Managing your Finances? N -  Housekeeping or managing your Housekeeping? N -  Some recent data might be hidden    Patient Care Team: Biagio Borg, MD as PCP - General Calvert Cantor, MD as Consulting Physician (Ophthalmology)  Indicate any recent Medical Services you may have received from other than [facility] providers in the past year (date may be approximate).     Assessment:   This is a routine wellness examination for [place].  Hearing/Vision screen Hearing Screening - Comments:: Patient denied any hearing difficulty. Vision Screening - Comments:: No glasses.  Eye exams done once a year by [facility]  Dietary issues and exercise  activities discussed: Current Exercise Habits: Structured exercise class, Type of exercise: treadmill;strength training/weights;walking;Other - see comments (loves to fish; gym 4 times a week), Time (Minutes): 30, Frequency (Times/Week): 5, Weekly Exercise (Minutes/Week): 150, Intensity: Moderate, Exercise limited by: orthopedic condition(s)   Goals Addressed   None   Depression Screen PHQ 2/9 Scores 06/11/2021 08/13/2020 08/13/2020 06/10/2020 07/18/2019 01/11/2019 07/16/2018  PHQ - 2 Score 0 0 0 0 0 0 0  PHQ- 9 Score - - 0 - - 0 0    Fall Risk Fall Risk  06/11/2021 08/13/2020 08/13/2020 06/10/2020 07/18/2019  Falls in the past year? 0 0 0 0 0  Number falls in past yr: 0 - 0 0 -  Injury with Fall? 0 - 0 0 -  Risk for fall due to : No Fall Risks - No Fall Risks No Fall Risks -  Follow up Falls evaluation completed - Falls evaluation completed Falls evaluation completed -    FALL RISK PREVENTION PERTAINING TO THE HOME:  Any stairs in or around the home? No  If so, are there any without handrails? No  Home free of loose throw rugs in walkways, pet beds, electrical cords, etc? Yes  Adequate lighting in your home to reduce risk of falls? Yes   ASSISTIVE DEVICES UTILIZED TO PREVENT FALLS:  Life alert? No  Use of a cane, walker or w/c? No  Grab bars in the bathroom? Yes  Shower chair or bench in shower? Yes  Elevated toilet seat or a handicapped toilet? Yes   TIMED UP AND GO:  Was the test performed? No .  Length of time to ambulate 10 feet: 0 sec.   Gait steady and fast without use of assistive device  Cognitive Function: MMSE - Mini Mental State Exam 01/11/2019  Orientation to time 5  Orientation to Place 5  Registration 3  Attention/ Calculation 5  Recall 1  Language- name 2 objects 2  Language- repeat 1  Language- follow 3 step command 3  Language- read & follow direction 1  Write a sentence 1  Copy design 1  Total score 28        Immunizations Immunization History  Administered  Date(s) Administered   Influenza Whole 09/11/2008, 09/11/2009   Influenza,inj,Quad PF,6+ Mos 09/11/2014   Influenza-Unspecified 10/12/2018   PFIZER(Purple Top)SARS-COV-2 Vaccination 12/28/2019, 01/16/2020, 10/09/2020   Pneumococcal Conjugate-13 07/03/2014   Pneumococcal Polysaccharide-23 03/12/2004, 03/16/2009   Td 12/12/2004, 12/13/2007   Tdap 07/16/2018   Zoster Recombinat (Shingrix) 06/08/2021   Zoster, Live 06/18/2012    TDAP  status: Up to date  Flu Vaccine status: Up to date  Pneumococcal vaccine status: Up to date  Covid-19 vaccine status: Completed vaccines  Qualifies for Shingles Vaccine? Yes   Zostavax completed Yes   Shingrix Completed?: Yes  Screening Tests Health Maintenance  Topic Date Due   COVID-19 Vaccine (4 - Booster for Pfizer series) 02/08/2021   INFLUENZA VACCINE  07/12/2021   Zoster Vaccines- Shingrix (2 of 2) 08/03/2021   TETANUS/TDAP  07/16/2028   PNA vac Low Risk Adult  Completed   HPV VACCINES  Aged Out    Health Maintenance  Health Maintenance Due  Topic Date Due   COVID-19 Vaccine (4 - Booster for Pfizer series) 02/08/2021    Colorectal cancer screening: No longer required.   Lung Cancer Screening: (Low Dose CT Chest recommended if Age 31-80 years, 30 pack-year currently smoking OR have quit w/in 15years.) does not qualify.   Lung Cancer Screening Referral: no  Additional Screening:  Hepatitis C Screening: does not qualify; Completed no  Vision Screening: Recommended annual ophthalmology exams for early detection of glaucoma and other disorders of the eye. Is the patient up to date with their annual eye exam?  Yes  Who is the provider or what is the name of the office in which the patient attends annual eye exams? [facility] If pt is not established with a provider, would they like to be referred to a provider to establish care? No .   Dental Screening: Recommended annual dental exams for proper oral hygiene  Community  Resource Referral / Chronic Care Management: CRR required this visit?  No   CCM required this visit?  No      Plan:     I have personally reviewed and noted the following in the patient's chart:   Medical and social history Use of alcohol, tobacco or illicit drugs  Current medications and supplements including opioid prescriptions. Patient is not currently taking opioid prescriptions. Functional ability and status Nutritional status Physical activity Advanced directives List of other physicians Hospitalizations, surgeries, and ER visits in previous 12 months Vitals Screenings to include cognitive, depression, and falls Referrals and appointments  In addition, I have reviewed and discussed with patient certain preventive protocols, quality metrics, and best practice recommendations. A written personalized care plan for preventive services as well as general preventive health recommendations were provided to patient.     Sheral Flow, LPN   12/14/2438   Nurse Notes:  Patient is cogitatively intact. There were no vitals filed for this visit. There is no height or weight on file to calculate BMI.  Medical screening examination/treatment/procedure(s) were performed by non-physician practitioner and as supervising physician I was immediately available for consultation/collaboration.  I agree with above. Cathlean Cower, MD

## 2021-07-02 DIAGNOSIS — Z96662 Presence of left artificial ankle joint: Secondary | ICD-10-CM | POA: Diagnosis not present

## 2021-07-13 DIAGNOSIS — M79642 Pain in left hand: Secondary | ICD-10-CM | POA: Insufficient documentation

## 2021-07-14 DIAGNOSIS — M19031 Primary osteoarthritis, right wrist: Secondary | ICD-10-CM | POA: Diagnosis not present

## 2021-07-14 DIAGNOSIS — M25531 Pain in right wrist: Secondary | ICD-10-CM | POA: Diagnosis not present

## 2021-08-09 ENCOUNTER — Telehealth: Payer: Self-pay

## 2021-08-09 DIAGNOSIS — E538 Deficiency of other specified B group vitamins: Secondary | ICD-10-CM

## 2021-08-09 DIAGNOSIS — Z0000 Encounter for general adult medical examination without abnormal findings: Secondary | ICD-10-CM

## 2021-08-09 DIAGNOSIS — R972 Elevated prostate specific antigen [PSA]: Secondary | ICD-10-CM

## 2021-08-09 DIAGNOSIS — E559 Vitamin D deficiency, unspecified: Secondary | ICD-10-CM

## 2021-08-09 NOTE — Telephone Encounter (Signed)
Patient requesting lab work be put in for Wednesday   Please contact to notified the orders are in for [place] [phone]

## 2021-08-10 ENCOUNTER — Telehealth: Payer: Self-pay | Admitting: Internal Medicine

## 2021-08-10 DIAGNOSIS — E538 Deficiency of other specified B group vitamins: Secondary | ICD-10-CM

## 2021-08-10 DIAGNOSIS — E559 Vitamin D deficiency, unspecified: Secondary | ICD-10-CM

## 2021-08-10 DIAGNOSIS — Z0000 Encounter for general adult medical examination without abnormal findings: Secondary | ICD-10-CM

## 2021-08-10 NOTE — Telephone Encounter (Signed)
Patient would like for lab orders to be put in so he can have this done before his appt  Please let patient know he is able to go have this done

## 2021-08-10 NOTE — Addendum Note (Signed)
Addended by: Biagio Borg on: 08/10/2021 06:40 PM   Modules accepted: Orders

## 2021-08-10 NOTE — Telephone Encounter (Signed)
Ok this is done 

## 2021-08-11 ENCOUNTER — Other Ambulatory Visit (INDEPENDENT_AMBULATORY_CARE_PROVIDER_SITE_OTHER): Payer: Medicare Other

## 2021-08-11 DIAGNOSIS — E538 Deficiency of other specified B group vitamins: Secondary | ICD-10-CM

## 2021-08-11 DIAGNOSIS — E559 Vitamin D deficiency, unspecified: Secondary | ICD-10-CM

## 2021-08-11 DIAGNOSIS — Z0000 Encounter for general adult medical examination without abnormal findings: Secondary | ICD-10-CM | POA: Diagnosis not present

## 2021-08-11 DIAGNOSIS — R972 Elevated prostate specific antigen [PSA]: Secondary | ICD-10-CM

## 2021-08-11 LAB — CBC WITH DIFFERENTIAL/PLATELET
Basophils Absolute: 0.1 10*3/uL (ref 0.0–0.1)
Basophils Relative: 1 % (ref 0.0–3.0)
Eosinophils Absolute: 0 10*3/uL (ref 0.0–0.7)
Eosinophils Relative: 0.8 % (ref 0.0–5.0)
HCT: 43.1 % (ref 39.0–52.0)
Hemoglobin: 14.2 g/dL (ref 13.0–17.0)
Lymphocytes Relative: 23.8 % (ref 12.0–46.0)
Lymphs Abs: 1.4 10*3/uL (ref 0.7–4.0)
MCHC: 33 g/dL (ref 30.0–36.0)
MCV: 92.1 fl (ref 78.0–100.0)
Monocytes Absolute: 0.6 10*3/uL (ref 0.1–1.0)
Monocytes Relative: 9.5 % (ref 3.0–12.0)
Neutro Abs: 3.9 10*3/uL (ref 1.4–7.7)
Neutrophils Relative %: 64.9 % (ref 43.0–77.0)
Platelets: 209 10*3/uL (ref 150.0–400.0)
RBC: 4.67 Mil/uL (ref 4.22–5.81)
RDW: 13.9 % (ref 11.5–15.5)
WBC: 6 10*3/uL (ref 4.0–10.5)

## 2021-08-11 LAB — HEPATIC FUNCTION PANEL
ALT: 23 U/L (ref 0–53)
AST: 33 U/L (ref 0–37)
Albumin: 4.3 g/dL (ref 3.5–5.2)
Alkaline Phosphatase: 46 U/L (ref 39–117)
Bilirubin, Direct: 0.2 mg/dL (ref 0.0–0.3)
Total Bilirubin: 0.9 mg/dL (ref 0.2–1.2)
Total Protein: 6.5 g/dL (ref 6.0–8.3)

## 2021-08-11 LAB — BASIC METABOLIC PANEL
BUN: 16 mg/dL (ref 6–23)
CO2: 27 mEq/L (ref 19–32)
Calcium: 9.5 mg/dL (ref 8.4–10.5)
Chloride: 106 mEq/L (ref 96–112)
Creatinine, Ser: 0.89 mg/dL (ref 0.40–1.50)
GFR: 79.78 mL/min (ref >60.00)
Glucose, Bld: 81 mg/dL (ref 70–99)
Potassium: 4.1 mEq/L (ref 3.5–5.1)
Sodium: 140 mEq/L (ref 135–145)

## 2021-08-11 LAB — URINALYSIS, ROUTINE W REFLEX MICROSCOPIC
Bilirubin Urine: NEGATIVE
Hgb urine dipstick: NEGATIVE
Ketones, ur: NEGATIVE
Leukocytes,Ua: NEGATIVE
Nitrite: NEGATIVE
RBC / HPF: NONE SEEN
Specific Gravity, Urine: >=1.03 — AB (ref 1.000–1.030)
Total Protein, Urine: NEGATIVE
Urine Glucose: NEGATIVE
Urobilinogen, UA: 0.2 (ref 0.0–1.0)
pH: 6 (ref 5.0–8.0)

## 2021-08-11 LAB — LIPID PANEL
Cholesterol: 157 mg/dL (ref 0–200)
HDL: 52.5 mg/dL (ref >39.00)
LDL Cholesterol: 88 mg/dL (ref 0–99)
NonHDL: 104.33
Total CHOL/HDL Ratio: 3
Triglycerides: 83 mg/dL (ref 0.0–149.0)
VLDL: 16.6 mg/dL (ref 0.0–40.0)

## 2021-08-11 LAB — VITAMIN B12: Vitamin B-12: 95 pg/mL — ABNORMAL LOW (ref 211–911)

## 2021-08-11 LAB — TSH: TSH: 2.91 u[IU]/mL (ref 0.35–5.50)

## 2021-08-11 LAB — VITAMIN D 25 HYDROXY (VIT D DEFICIENCY, FRACTURES): VITD: 44.21 ng/mL (ref 30.00–100.00)

## 2021-08-11 LAB — PSA: PSA: 2.07 ng/mL (ref 0.10–4.00)

## 2021-08-11 NOTE — Telephone Encounter (Signed)
Ok labs ordered 

## 2021-08-11 NOTE — Telephone Encounter (Signed)
Pt states he has had labs drawn already.

## 2021-08-17 ENCOUNTER — Other Ambulatory Visit: Payer: Self-pay

## 2021-08-17 ENCOUNTER — Encounter: Payer: Self-pay | Admitting: Internal Medicine

## 2021-08-17 ENCOUNTER — Ambulatory Visit (INDEPENDENT_AMBULATORY_CARE_PROVIDER_SITE_OTHER): Payer: Medicare Other | Admitting: Internal Medicine

## 2021-08-17 VITALS — BP 132/68 | HR 67 | Temp 98.7°F | Ht 69.0 in | Wt 169.0 lb

## 2021-08-17 DIAGNOSIS — M7122 Synovial cyst of popliteal space [Baker], left knee: Secondary | ICD-10-CM

## 2021-08-17 DIAGNOSIS — Z0001 Encounter for general adult medical examination with abnormal findings: Secondary | ICD-10-CM

## 2021-08-17 DIAGNOSIS — E538 Deficiency of other specified B group vitamins: Secondary | ICD-10-CM

## 2021-08-17 DIAGNOSIS — F32A Depression, unspecified: Secondary | ICD-10-CM

## 2021-08-17 DIAGNOSIS — E7800 Pure hypercholesterolemia, unspecified: Secondary | ICD-10-CM

## 2021-08-17 DIAGNOSIS — E559 Vitamin D deficiency, unspecified: Secondary | ICD-10-CM

## 2021-08-17 DIAGNOSIS — R972 Elevated prostate specific antigen [PSA]: Secondary | ICD-10-CM | POA: Diagnosis not present

## 2021-08-17 MED ORDER — VITAMIN B-12 1000 MCG PO TABS: 1000.0000 ug | ORAL_TABLET | Freq: Every day | ORAL | 3 refills | Status: DC

## 2021-08-17 NOTE — Patient Instructions (Signed)
Please see Dr Doran Durand about the left knee Baker's cyst next month as you mentioned  Please take all new medication as we mentioned - B12 1000 mcg per day (OTC)  Please continue all other medications as before, and refills have been done if requested.  Please have the pharmacy call with any other refills you may need.  Please continue your efforts at being more active, low cholesterol diet, and weight control.  You are otherwise up to date with prevention measures today.  Please keep your appointments with your specialists as you may have planned  Please make an Appointment to return for your 1 year visit, or sooner if needed, with Lab testing by Appointment as well, to be done about 3-5 days before at the [facility] (so this is for TWO appointments - please see the scheduling desk as you leave)  Due to the ongoing Covid 19 pandemic, our lab now requires an appointment for any labs done at our office.  If you need labs done and do not have an appointment, please call our office ahead of time to schedule before presenting to the lab for your testing.

## 2021-08-17 NOTE — Progress Notes (Signed)
Patient ID: Alexander Castillo, male   DOB: [birth year], 80 y.o.   MRN: [id number]         Chief Complaint:: wellness exam and left knee pain posteriorly, low b12, elevated psa, hld, depression       HPI:  Alexander Castillo is a 80 y.o. male here for wellness exam; declines covid booster, shingrix, flu shot, ow up to date with preventive referrals and immunizations                        Also c/o 1 mo gradually worsening post left knee pain with a mild bulging tender, intermitent, worse to bend the knee, better to extend the leg, nothing else makes better or worse.  Not taking B12.  Denies urinary symptoms such as dysuria, frequency, urgency, flank pain, hematuria or n/v, fever, chills.  Trying to follow lower chol diet  Denies worsening depressive symptoms, suicidal ideation, or panic;  Pt denies chest pain, increased sob or doe, wheezing, orthopnea, PND, increased LE swelling, palpitations, dizziness or syncope.   Pt denies polydipsia, polyuria, or new focal neuro s/s.   Pt denies fever, wt loss, night sweats, loss of appetite, or other constitutional symptoms  No other new complaints   Wt Readings from Last 3 Encounters:  08/17/21 169 lb (76.7 kg)  12/31/20 171 lb 1.2 oz (77.6 kg)  08/13/20 170 lb (77.1 kg)   BP Readings from Last 3 Encounters:  08/17/21 132/68  12/31/20 125/64  08/13/20 130/70   Immunization History  Administered Date(s) Administered   Influenza Whole 09/11/2008, 09/11/2009   Influenza, High Dose Seasonal PF 08/26/2014, 09/08/2015, 08/23/2016, 09/18/2017, 08/30/2018, 09/12/2019   Influenza,inj,Quad PF,6+ Mos 09/11/2014   Influenza-Unspecified 10/12/2018   PFIZER(Purple Top)SARS-COV-2 Vaccination 12/28/2019, 01/16/2020, 10/09/2020   Pneumococcal Conjugate-13 07/03/2014   Pneumococcal Polysaccharide-23 03/12/2004, 03/16/2009   Td 12/12/2004, 12/13/2007   Tdap 07/16/2018   Zoster Recombinat (Shingrix) 06/08/2021   Zoster, Live 06/18/2012   There are no preventive care  reminders to display for this patient.     Past Medical History:  Diagnosis Date   BENIGN PROSTATIC HYPERTROPHY 03/10/2008   BENIGN PROSTATIC HYPERTROPHY, HX OF 03/10/2008   CHEST PAIN 03/10/2008   at pre-op 01-17-18: per patient had cardiac eval at that time was sent for test (uaware type) ; reports nothing was abnormal; no recurrence of chest pain    DEGENERATIVE JOINT DISEASE 03/10/2008   EAR PAIN, LEFT 12/09/2008   HYPERLIPIDEMIA 03/10/2008   Osteoarthritis    left ankle   PSA, INCREASED 03/16/2009   RASH-NONVESICULAR 03/16/2009   Past Surgical History:  Procedure Laterality Date   ACHILLES TENDON LENGTHENING Left 12/31/2020   Procedure: Percutaneous Achilles Tendon Lengthening;  Surgeon: Wylene Simmer, MD;  Location: [facility];  Service: Orthopedics;  Laterality: Left;   back [place]   lower   EYE SURGERY Bilateral    lens replacement for cataracts   hip replacement left Left 2002   JOINT REPLACEMENT Right    knee 2019, left hip 2017   s/p left knee arthroscopy  yrs ago   TOTAL ANKLE ARTHROPLASTY Left 12/31/2020   Procedure: Left total ankle replacement;  Surgeon: Wylene Simmer, MD;  Location: [facility];  Service: Orthopedics;  Laterality: Left;   TOTAL HIP ARTHROPLASTY Right 06/13/2016   Procedure: RIGHT TOTAL HIP ARTHROPLASTY ANTERIOR APPROACH;  Surgeon: Paralee Cancel, MD;  [facility];  Service: Orthopedics;  Laterality: Right;  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  TOTAL KNEE ARTHROPLASTY Right 01/23/2018   Procedure: RIGHT TOTAL KNEE ARTHROPLASTY;  Surgeon: Paralee Cancel, MD;  [facility];  Service: Orthopedics;  Laterality: Right;  70 mins   transurethral resection of prostate      reports that he has never smoked. He has quit using smokeless tobacco. He reports that he does not drink alcohol and does not use drugs. family history includes Dementia in his mother. No Known Allergies Current Outpatient Medications on File Prior to Visit   Medication Sig Dispense Refill   aspirin EC 81 MG tablet Take 1 tablet (81 mg total) by mouth 2 (two) times daily. (Patient not taking: No sig reported) 84 tablet 0   docusate sodium (COLACE) 100 MG capsule Take 1 capsule (100 mg total) by mouth 2 (two) times daily. While taking narcotic pain medicine. (Patient not taking: No sig reported) 30 capsule 0   senna (SENOKOT) 8.6 MG TABS tablet Take 2 tablets (17.2 mg total) by mouth 2 (two) times daily. (Patient not taking: No sig reported) 30 tablet 0   No current facility-administered medications on file prior to visit.        ROS:  All others reviewed and negative.  Objective        PE:  BP 132/68 (BP Location: Right Arm, Patient Position: Sitting, Cuff Size: Normal)   Pulse 67   Temp 98.7 F (37.1 C) (Oral)   Ht '5\' 9"'$  (1.753 m)   Wt 169 lb (76.7 kg)   SpO2 98%   BMI 24.96 kg/m                 Constitutional: Pt appears in NAD               HENT: Head: NCAT.                Right Ear: External ear normal.                 Left Ear: External ear normal.                Eyes: . Pupils are equal, round, and reactive to light. Conjunctivae and EOM are normal               Nose: without d/c or deformity               Neck: Neck supple. Gross normal ROM               Cardiovascular: Normal rate and regular rhythm.                 Pulmonary/Chest: Effort normal and breath sounds without rales or wheezing.                Abd:  Soft, NT, ND, + BS, no organomegaly               Left post knee with > golf ball sized mild tender soft/firm mass               Neurological: Pt is alert. At baseline orientation, motor grossly intact               Skin: Skin is warm. No rashes, no other new lesions, LE edema - none               Psychiatric: Pt behavior is normal without agitation   Micro: none  Cardiac tracings I have personally interpreted today:  none  Pertinent Radiological findings (summarize):  none   Lab Results  Component Value Date    WBC 6.0 08/11/2021   HGB 14.2 08/11/2021   HCT 43.1 08/11/2021   PLT 209.0 08/11/2021   GLUCOSE 81 08/11/2021   CHOL 157 08/11/2021   TRIG 83.0 08/11/2021   HDL 52.50 08/11/2021   LDLCALC 88 08/11/2021   ALT 23 08/11/2021   AST 33 08/11/2021   NA 140 08/11/2021   K 4.1 08/11/2021   CL 106 08/11/2021   CREATININE 0.89 08/11/2021   BUN 16 08/11/2021   CO2 27 08/11/2021   TSH 2.91 08/11/2021   PSA 2.07 08/11/2021   Assessment/Plan:  Alexander Castillo is a 80 y.o. White or Caucasian [1] male with  has a past medical history of BENIGN PROSTATIC HYPERTROPHY (03/10/2008), BENIGN PROSTATIC HYPERTROPHY, HX OF (03/10/2008), CHEST PAIN (03/10/2008), DEGENERATIVE JOINT DISEASE (03/10/2008), EAR PAIN, LEFT (12/09/2008), HYPERLIPIDEMIA (03/10/2008), Osteoarthritis, PSA, INCREASED (03/16/2009), and RASH-NONVESICULAR (03/16/2009).  B12 deficiency Lab Results  Component Value Date   VITAMINB12 95 (L) 08/11/2021   Low, to start oral replacement - b12 1000 mcg qd   Encounter for well adult exam with abnormal findings Age and sex appropriate education and counseling updated with regular exercise and diet Referrals for preventative services - none needed Immunizations addressed - declines covid booster, shingrix, flu shot Smoking counseling  - none needed Evidence for depression or other mood disorder - none significant Most recent labs reviewed. I have personally reviewed and have noted: 1) the patient's medical and social history 2) The patient's current medications and supplements 3) The patient's height, weight, and BMI have been recorded in the chart   PSA, INCREASED Asympt, pt requests psa  Hyperlipidemia Lab Results  Component Value Date   [place] 88 08/11/2021   Stable, pt to continue current low chol diet, declines statin   Depression Stable overall, declines need for further med tx at this time  Baker cyst, left Mild symptomatic, recent worsening, pt educated and to f/u with  ortho/dr hewitt as planned  Followup: Return in about 1 year (around 08/17/2022).  Cathlean Cower, MD 08/22/2021 2:09 PM [facility]

## 2021-08-17 NOTE — Assessment & Plan Note (Signed)
Lab Results  Component Value Date   VITAMINB12 95 (L) 08/11/2021   Low, to start oral replacement - b12 1000 mcg qd

## 2021-08-22 ENCOUNTER — Encounter: Payer: Self-pay | Admitting: Internal Medicine

## 2021-08-22 NOTE — Assessment & Plan Note (Signed)
Asympt, pt requests psa

## 2021-08-22 NOTE — Assessment & Plan Note (Signed)
Stable overall, declines need for further med tx at this time

## 2021-08-22 NOTE — Assessment & Plan Note (Signed)
Lab Results  Component Value Date   LDLCALC 88 08/11/2021   Stable, pt to continue current low chol diet, declines statin

## 2021-08-22 NOTE — Assessment & Plan Note (Signed)
Mild symptomatic, recent worsening, pt educated and to f/u with ortho/dr hewitt as planned

## 2021-08-22 NOTE — Assessment & Plan Note (Signed)
Age and sex appropriate education and counseling updated with regular exercise and diet Referrals for preventative services - none needed Immunizations addressed - declines covid booster, shingrix, flu shot Smoking counseling  - none needed Evidence for depression or other mood disorder - none significant Most recent labs reviewed. I have personally reviewed and have noted: 1) the patient's medical and social history 2) The patient's current medications and supplements 3) The patient's height, weight, and BMI have been recorded in the chart  

## 2021-09-08 DIAGNOSIS — M6740 Ganglion, unspecified site: Secondary | ICD-10-CM | POA: Diagnosis not present

## 2021-09-08 DIAGNOSIS — L720 Epidermal cyst: Secondary | ICD-10-CM | POA: Diagnosis not present

## 2021-10-13 DIAGNOSIS — M19031 Primary osteoarthritis, right wrist: Secondary | ICD-10-CM | POA: Diagnosis not present

## 2021-10-13 DIAGNOSIS — M67431 Ganglion, right wrist: Secondary | ICD-10-CM | POA: Diagnosis not present

## 2021-10-13 DIAGNOSIS — M25531 Pain in right wrist: Secondary | ICD-10-CM | POA: Diagnosis not present

## 2021-10-13 DIAGNOSIS — M13831 Other specified arthritis, right wrist: Secondary | ICD-10-CM | POA: Diagnosis not present

## 2021-10-14 DIAGNOSIS — M19031 Primary osteoarthritis, right wrist: Secondary | ICD-10-CM | POA: Insufficient documentation

## 2021-10-14 DIAGNOSIS — M65839 Other synovitis and tenosynovitis, unspecified forearm: Secondary | ICD-10-CM | POA: Insufficient documentation

## 2021-10-21 DIAGNOSIS — L720 Epidermal cyst: Secondary | ICD-10-CM | POA: Diagnosis not present

## 2021-10-21 DIAGNOSIS — L570 Actinic keratosis: Secondary | ICD-10-CM | POA: Diagnosis not present

## 2021-10-21 DIAGNOSIS — L821 Other seborrheic keratosis: Secondary | ICD-10-CM | POA: Diagnosis not present

## 2021-10-21 DIAGNOSIS — Z85820 Personal history of malignant melanoma of skin: Secondary | ICD-10-CM | POA: Diagnosis not present

## 2021-10-21 DIAGNOSIS — L812 Freckles: Secondary | ICD-10-CM | POA: Diagnosis not present

## 2021-12-10 DIAGNOSIS — M1811 Unilateral primary osteoarthritis of first carpometacarpal joint, right hand: Secondary | ICD-10-CM | POA: Diagnosis not present

## 2021-12-10 DIAGNOSIS — M659 Synovitis and tenosynovitis, unspecified: Secondary | ICD-10-CM | POA: Diagnosis not present

## 2021-12-10 DIAGNOSIS — M65131 Other infective (teno)synovitis, right wrist: Secondary | ICD-10-CM | POA: Diagnosis not present

## 2021-12-10 DIAGNOSIS — M19031 Primary osteoarthritis, right wrist: Secondary | ICD-10-CM | POA: Diagnosis not present

## 2021-12-20 DIAGNOSIS — H43812 Vitreous degeneration, left eye: Secondary | ICD-10-CM | POA: Diagnosis not present

## 2021-12-20 DIAGNOSIS — H26493 Other secondary cataract, bilateral: Secondary | ICD-10-CM | POA: Diagnosis not present

## 2021-12-20 DIAGNOSIS — H353111 Nonexudative age-related macular degeneration, right eye, early dry stage: Secondary | ICD-10-CM | POA: Diagnosis not present

## 2021-12-20 DIAGNOSIS — H02831 Dermatochalasis of right upper eyelid: Secondary | ICD-10-CM | POA: Diagnosis not present

## 2021-12-22 DIAGNOSIS — Z4789 Encounter for other orthopedic aftercare: Secondary | ICD-10-CM | POA: Diagnosis not present

## 2021-12-31 DIAGNOSIS — Z96651 Presence of right artificial knee joint: Secondary | ICD-10-CM | POA: Diagnosis not present

## 2021-12-31 DIAGNOSIS — M1712 Unilateral primary osteoarthritis, left knee: Secondary | ICD-10-CM | POA: Diagnosis not present

## 2021-12-31 DIAGNOSIS — M25562 Pain in left knee: Secondary | ICD-10-CM | POA: Diagnosis not present

## 2022-01-03 DIAGNOSIS — M19072 Primary osteoarthritis, left ankle and foot: Secondary | ICD-10-CM | POA: Diagnosis not present

## 2022-01-06 DIAGNOSIS — Z4789 Encounter for other orthopedic aftercare: Secondary | ICD-10-CM | POA: Diagnosis not present

## 2022-01-06 DIAGNOSIS — M25531 Pain in right wrist: Secondary | ICD-10-CM | POA: Diagnosis not present

## 2022-01-13 DIAGNOSIS — M25531 Pain in right wrist: Secondary | ICD-10-CM | POA: Diagnosis not present

## 2022-01-21 DIAGNOSIS — M13831 Other specified arthritis, right wrist: Secondary | ICD-10-CM | POA: Diagnosis not present

## 2022-02-03 DIAGNOSIS — M13831 Other specified arthritis, right wrist: Secondary | ICD-10-CM | POA: Diagnosis not present

## 2022-02-03 DIAGNOSIS — Z4789 Encounter for other orthopedic aftercare: Secondary | ICD-10-CM | POA: Diagnosis not present

## 2022-02-07 DIAGNOSIS — M13831 Other specified arthritis, right wrist: Secondary | ICD-10-CM | POA: Diagnosis not present

## 2022-02-10 DIAGNOSIS — M13831 Other specified arthritis, right wrist: Secondary | ICD-10-CM | POA: Diagnosis not present

## 2022-02-14 DIAGNOSIS — M13831 Other specified arthritis, right wrist: Secondary | ICD-10-CM | POA: Diagnosis not present

## 2022-02-17 DIAGNOSIS — M25531 Pain in right wrist: Secondary | ICD-10-CM | POA: Diagnosis not present

## 2022-02-21 DIAGNOSIS — M13831 Other specified arthritis, right wrist: Secondary | ICD-10-CM | POA: Diagnosis not present

## 2022-02-24 DIAGNOSIS — M25531 Pain in right wrist: Secondary | ICD-10-CM | POA: Diagnosis not present

## 2022-02-28 DIAGNOSIS — M25531 Pain in right wrist: Secondary | ICD-10-CM | POA: Diagnosis not present

## 2022-03-03 DIAGNOSIS — M25531 Pain in right wrist: Secondary | ICD-10-CM | POA: Diagnosis not present

## 2022-03-07 DIAGNOSIS — M25531 Pain in right wrist: Secondary | ICD-10-CM | POA: Diagnosis not present

## 2022-03-10 DIAGNOSIS — G5602 Carpal tunnel syndrome, left upper limb: Secondary | ICD-10-CM | POA: Diagnosis not present

## 2022-03-10 DIAGNOSIS — R200 Anesthesia of skin: Secondary | ICD-10-CM | POA: Diagnosis not present

## 2022-03-10 DIAGNOSIS — M79642 Pain in left hand: Secondary | ICD-10-CM | POA: Diagnosis not present

## 2022-03-10 DIAGNOSIS — Z4789 Encounter for other orthopedic aftercare: Secondary | ICD-10-CM | POA: Diagnosis not present

## 2022-03-12 DIAGNOSIS — G5602 Carpal tunnel syndrome, left upper limb: Secondary | ICD-10-CM | POA: Insufficient documentation

## 2022-03-12 DIAGNOSIS — R200 Anesthesia of skin: Secondary | ICD-10-CM | POA: Insufficient documentation

## 2022-03-18 DIAGNOSIS — G5602 Carpal tunnel syndrome, left upper limb: Secondary | ICD-10-CM | POA: Diagnosis not present

## 2022-03-23 DIAGNOSIS — Z4789 Encounter for other orthopedic aftercare: Secondary | ICD-10-CM | POA: Diagnosis not present

## 2022-03-23 DIAGNOSIS — G5602 Carpal tunnel syndrome, left upper limb: Secondary | ICD-10-CM | POA: Diagnosis not present

## 2022-04-26 DIAGNOSIS — G5602 Carpal tunnel syndrome, left upper limb: Secondary | ICD-10-CM | POA: Diagnosis not present

## 2022-05-11 DIAGNOSIS — M79642 Pain in left hand: Secondary | ICD-10-CM | POA: Diagnosis not present

## 2022-07-11 ENCOUNTER — Ambulatory Visit (INDEPENDENT_AMBULATORY_CARE_PROVIDER_SITE_OTHER): Payer: Medicare Other

## 2022-07-11 DIAGNOSIS — Z0000 Encounter for general adult medical examination without abnormal findings: Secondary | ICD-10-CM

## 2022-07-11 NOTE — Patient Instructions (Signed)
Alexander Castillo , Thank you for taking time to come for your Medicare Wellness Visit. I appreciate your ongoing commitment to your health goals. Please review the following plan we discussed and let me know if I can assist you in the future.   Screening recommendations/referrals: Colonoscopy: no longer required  Recommended yearly ophthalmology/optometry visit for glaucoma screening and checkup Recommended yearly dental visit for hygiene and checkup  Vaccinations: Influenza vaccine: completed  Pneumococcal vaccine: completed  Tdap vaccine: 07/16/2018 Shingles vaccine: completed     Advanced directives: yes   Conditions/risks identified: none   Next appointment: none   Preventive Care 12 Years and Older, Male Preventive care refers to lifestyle choices and visits with your health care provider that can promote health and wellness. What does preventive care include? A yearly physical exam. This is also called an annual well check. Dental exams once or twice a year. Routine eye exams. Ask your health care provider how often you should have your eyes checked. Personal lifestyle choices, including: Daily care of your teeth and gums. Regular physical activity. Eating a healthy diet. Avoiding tobacco and drug use. Limiting alcohol use. Practicing safe sex. Taking low doses of aspirin every day. Taking vitamin and mineral supplements as recommended by your health care provider. What happens during an annual well check? The services and screenings done by your health care provider during your annual well check will depend on your age, overall health, lifestyle risk factors, and family history of disease. Counseling  Your health care provider may ask you questions about your: Alcohol use. Tobacco use. Drug use. Emotional well-being. Home and relationship well-being. Sexual activity. Eating habits. History of falls. Memory and ability to understand (cognition). Work and work  Statistician. Screening  You may have the following tests or measurements: Height, weight, and BMI. Blood pressure. Lipid and cholesterol levels. These may be checked every 5 years, or more frequently if you are over 40 years old. Skin check. Lung cancer screening. You may have this screening every year starting at age 27 if you have a 30-pack-year history of smoking and currently smoke or have quit within the past 15 years. Fecal occult blood test (FOBT) of the stool. You may have this test every year starting at age 58. Flexible sigmoidoscopy or colonoscopy. You may have a sigmoidoscopy every 5 years or a colonoscopy every 10 years starting at age 79. Prostate cancer screening. Recommendations will vary depending on your family history and other risks. Hepatitis C blood test. Hepatitis B blood test. Sexually transmitted disease (STD) testing. Diabetes screening. This is done by checking your blood sugar (glucose) after you have not eaten for a while (fasting). You may have this done every 1-3 years. Abdominal aortic aneurysm (AAA) screening. You may need this if you are a current or former smoker. Osteoporosis. You may be screened starting at age 15 if you are at high risk. Talk with your health care provider about your test results, treatment options, and if necessary, the need for more tests. Vaccines  Your health care provider may recommend certain vaccines, such as: Influenza vaccine. This is recommended every year. Tetanus, diphtheria, and acellular pertussis (Tdap, Td) vaccine. You may need a Td booster every 10 years. Zoster vaccine. You may need this after age 68. Pneumococcal 13-valent conjugate (PCV13) vaccine. One dose is recommended after age 64. Pneumococcal polysaccharide (PPSV23) vaccine. One dose is recommended after age 92. Talk to your health care provider about which screenings and vaccines you need and how  often you need them. This information is not intended to replace  advice given to you by your health care provider. Make sure you discuss any questions you have with your health care provider. Document Released: 12/25/2015 Document Revised: 08/17/2016 Document Reviewed: 09/29/2015 Elsevier Interactive Patient Education  2017 [place] Prevention in the Home Falls can cause injuries. They can happen to people of all ages. There are many things you can do to make your home safe and to help prevent falls. What can I do on the outside of my home? Regularly fix the edges of walkways and driveways and fix any cracks. Remove anything that might make you trip as you walk through a door, such as a raised step or threshold. Trim any bushes or trees on the path to your home. Use bright outdoor lighting. Clear any walking paths of anything that might make someone trip, such as rocks or tools. Regularly check to see if handrails are loose or broken. Make sure that both sides of any steps have handrails. Any raised decks and porches should have guardrails on the edges. Have any leaves, snow, or ice cleared regularly. Use sand or salt on walking paths during winter. Clean up any spills in your garage right away. This includes oil or grease spills. What can I do in the bathroom? Use night lights. Install grab bars by the toilet and in the tub and shower. Do not use towel bars as grab bars. Use non-skid mats or decals in the tub or shower. If you need to sit down in the shower, use a plastic, non-slip stool. Keep the floor dry. Clean up any water that spills on the floor as soon as it happens. Remove soap buildup in the tub or shower regularly. Attach bath mats securely with double-sided non-slip rug tape. Do not have throw rugs and other things on the floor that can make you trip. What can I do in the bedroom? Use night lights. Make sure that you have a light by your bed that is easy to reach. Do not use any sheets or blankets that are too big for your bed.  They should not hang down onto the floor. Have a firm chair that has side arms. You can use this for support while you get dressed. Do not have throw rugs and other things on the floor that can make you trip. What can I do in the kitchen? Clean up any spills right away. Avoid walking on wet floors. Keep items that you use a lot in easy-to-reach places. If you need to reach something above you, use a strong step stool that has a grab bar. Keep electrical cords out of the way. Do not use floor polish or wax that makes floors slippery. If you must use wax, use non-skid floor wax. Do not have throw rugs and other things on the floor that can make you trip. What can I do with my stairs? Do not leave any items on the stairs. Make sure that there are handrails on both sides of the stairs and use them. Fix handrails that are broken or loose. Make sure that handrails are as long as the stairways. Check any carpeting to make sure that it is firmly attached to the stairs. Fix any carpet that is loose or worn. Avoid having throw rugs at the top or bottom of the stairs. If you do have throw rugs, attach them to the floor with carpet tape. Make sure that you have a  light switch at the top of the stairs and the bottom of the stairs. If you do not have them, ask someone to add them for you. What else can I do to help prevent falls? Wear shoes that: Do not have high heels. Have rubber bottoms. Are comfortable and fit you well. Are closed at the toe. Do not wear sandals. If you use a stepladder: Make sure that it is fully opened. Do not climb a closed stepladder. Make sure that both sides of the stepladder are locked into place. Ask someone to hold it for you, if possible. Clearly mark and make sure that you can see: Any grab bars or handrails. First and last steps. Where the edge of each step is. Use tools that help you move around (mobility aids) if they are needed. These  include: Canes. Walkers. Scooters. Crutches. Turn on the lights when you go into a dark area. Replace any light bulbs as soon as they burn out. Set up your furniture so you have a clear path. Avoid moving your furniture around. If any of your floors are uneven, fix them. If there are any pets around you, be aware of where they are. Review your medicines with your doctor. Some medicines can make you feel dizzy. This can increase your chance of falling. Ask your doctor what other things that you can do to help prevent falls. This information is not intended to replace advice given to you by your health care provider. Make sure you discuss any questions you have with your health care provider. Document Released: 09/24/2009 Document Revised: 05/05/2016 Document Reviewed: 01/02/2015 Elsevier Interactive Patient Education  2017 [employer].

## 2022-07-11 NOTE — Progress Notes (Signed)
Subjective:   Alexander Castillo is a 81 y.o. male who presents for an Subsequent Medicare Annual Wellness Visit.   I connected with Alexander Castillo  today by telephone and verified that I am speaking with the correct person using two identifiers. Location patient: home Location provider: work Persons participating in the virtual visit: patient, provider.   I discussed the limitations, risks, security and privacy concerns of performing an evaluation and management service by telephone and the availability of in person appointments. I also discussed with the patient that there may be a patient responsible charge related to this service. The patient expressed understanding and verbally consented to this telephonic visit.    Interactive audio and video telecommunications were attempted between this provider and patient, however failed, due to patient having technical difficulties OR patient did not have access to video capability.  We continued and completed visit with audio only.    Review of Systems     Cardiac Risk Factors include: advanced age (>39mn, >>8women);male gender     Objective:    Today's Vitals   There is no height or weight on file to calculate BMI.     07/11/2022    1:40 PM 06/11/2021   11:21 AM 12/31/2020    6:43 AM 12/25/2020   11:51 AM 06/10/2020    2:18 PM 01/11/2019    1:10 PM 01/23/2018    3:44 PM  Advanced Directives  Does Patient Have a Medical Advance Directive? Yes Yes Yes Yes Yes Yes Yes  Type of AParamedicof AOld HundredLiving will HOdinLiving will HColor[place] CityLiving will HWest [place]Living will HGood[place]Living will [place]loomdaleLiving will Living will  Does patient want to make changes to medical advance directive?  No - Patient declined No - Patient declined No - Patient declined No - Patient declined  No - Patient declined  Copy of  HDe [place] Chart? No - copy requested No - copy requested No - copy requested No - copy requested No - copy requested No - copy requested     Current Medications (verified) Outpatient Encounter Medications as of 07/11/2022  Medication Sig   aspirin EC 81 MG tablet Take 1 tablet (81 mg total) by mouth 2 (two) times daily. (Patient not taking: Reported on 06/11/2021)   docusate sodium (COLACE) 100 MG capsule Take 1 capsule (100 mg total) by mouth 2 (two) times daily. While taking narcotic pain medicine. (Patient not taking: Reported on 06/11/2021)   senna (SENOKOT) 8.6 MG TABS tablet Take 2 tablets (17.2 mg total) by mouth 2 (two) times daily. (Patient not taking: Reported on 06/11/2021)   vitamin B-12 (CYANOCOBALAMIN) 1000 MCG tablet Take 1 tablet (1,000 mcg total) by mouth daily. (Patient not taking: Reported on 07/11/2022)   No facility-administered encounter medications on file as of 07/11/2022.    Allergies (verified) Patient has no known allergies.   History: Past Medical History:  Diagnosis Date   BENIGN PROSTATIC HYPERTROPHY 03/10/2008   BENIGN PROSTATIC HYPERTROPHY, HX OF 03/10/2008   CHEST PAIN 03/10/2008   at pre-op 01-17-18: per patient had cardiac eval at that time was sent for test (uaware type) ; reports nothing was abnormal; no recurrence of chest pain    DEGENERATIVE JOINT DISEASE 03/10/2008   EAR PAIN, LEFT 12/09/2008   HYPERLIPIDEMIA 03/10/2008   Osteoarthritis    left ankle   PSA, INCREASED 03/16/2009   RASH-NONVESICULAR 03/16/2009   Past  Surgical History:  Procedure Laterality Date   ACHILLES TENDON LENGTHENING Left 12/31/2020   Procedure: Percutaneous Achilles Tendon Lengthening;  Surgeon: Wylene Simmer, MD;  Location: [facility];  Service: Orthopedics;  Laterality: Left;   back [place]   lower   EYE SURGERY Bilateral    lens replacement for cataracts   hip replacement left Left 2002   JOINT REPLACEMENT Right    knee 2019, left hip  2017   s/p left knee arthroscopy  yrs ago   TOTAL ANKLE ARTHROPLASTY Left 12/31/2020   Procedure: Left total ankle replacement;  Surgeon: Wylene Simmer, MD;  Location: [facility];  Service: Orthopedics;  Laterality: Left;   TOTAL HIP ARTHROPLASTY Right 06/13/2016   Procedure: RIGHT TOTAL HIP ARTHROPLASTY ANTERIOR APPROACH;  Surgeon: Paralee Cancel, MD;  Location: [facility];  Service: Orthopedics;  Laterality: Right;  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   TOTAL KNEE ARTHROPLASTY Right 01/23/2018   Procedure: RIGHT TOTAL KNEE ARTHROPLASTY;  Surgeon: Paralee Cancel, MD;  Location: [facility];  Service: Orthopedics;  Laterality: Right;  70 mins   transurethral resection of prostate     Family History  Problem Relation Age of Onset   Dementia Mother    Social History   Socioeconomic History   Marital status: Widowed    Spouse name: Not on file   Number of children: 2   Years of education: Not on file   Highest education level: Not on file  Occupational History   Occupation: retired Chief Strategy Officer   Tobacco Use   Smoking status: Never   Smokeless tobacco: Former  Scientific laboratory technician Use: Never used  Substance and Sexual Activity   Alcohol use: No   Drug use: No   Sexual activity: Not Currently  Other Topics Concern   Not on file  Social History Narrative   Not on file   Social Determinants of Health   Financial Resource Strain: Low Risk  (07/11/2022)   Overall Financial Resource Strain (CARDIA)    Difficulty of Paying Living Expenses: Not hard at all  Food Insecurity: No Food Insecurity (07/11/2022)   Hunger Vital Sign    Worried About Running Out of Food in the Last Year: Never true    [place] in the Last Year: Never true  Transportation Needs: No Transportation Needs (07/11/2022)   PRAPARE - Hydrologist (Medical): No    Lack of Transportation (Non-Medical): No  Physical Activity: Sufficiently Active (07/11/2022)   Exercise Vital Sign    Days of  Exercise per Week: 5 days    Minutes of Exercise per Session: 30 min  Stress: No Stress Concern Present (07/11/2022)   [place]    Feeling of Stress : Not at all  Social Connections: Socially Isolated (07/11/2022)   Social Connection and Isolation Panel [NHANES]    Frequency of Communication with Friends and Family: Three times a week    Frequency of Social Gatherings with Friends and Family: Three times a week    Attends Religious Services: Never    Active Member of Clubs or Organizations: No    Attends Archivist Meetings: Never    Marital Status: Widowed    Tobacco Counseling Counseling given: Not Answered   Clinical Intake:  Pre-visit preparation completed: Yes  Pain : No/denies pain     Nutritional Risks: None Diabetes: No  How often do you need to have  someone help you when you read instructions, pamphlets, or other written materials from your doctor or pharmacy?: 1 - Never What is the last grade level you completed in school?: [place]   Interpreter Needed?: No  Information entered by :: L.Wilson,LPN   Activities of Daily Living    07/11/2022    1:41 PM  In your present state of health, do you have any difficulty performing the following activities:  Hearing? 0  Vision? 0  Difficulty concentrating or making decisions? 0  Walking or climbing stairs? 0  Dressing or bathing? 0  Doing errands, shopping? 0  Preparing Food and eating ? N  Using the Toilet? N  In the past six months, have you accidently leaked urine? N  Do you have problems with loss of bowel control? N  Managing your Medications? N  Managing your Finances? N  Housekeeping or managing your Housekeeping? N    Patient Care Team: Biagio Borg, MD as PCP - Mel Almond, MD as Consulting Physician (Ophthalmology)  Indicate any recent Medical Services you may have received from other than [facility]  providers in the past year (date may be approximate).     Assessment:   This is a routine wellness examination for Abanda.  Hearing/Vision screen Vision Screening - Comments:: Annual eye exams   Dietary issues and exercise activities discussed: Current Exercise Habits: Home exercise routine, Type of exercise: walking, Time (Minutes): 30, Frequency (Times/Week): 5, Weekly Exercise (Minutes/Week): 150, Intensity: Mild, Exercise limited by: None identified   Goals Addressed   None    Depression Screen    07/11/2022    1:40 PM 08/17/2021    1:23 PM 08/17/2021    1:05 PM 06/11/2021   11:24 AM 08/13/2020    2:01 PM 08/13/2020    1:30 PM 06/10/2020    2:18 PM  PHQ 2/9 Scores  PHQ - 2 Score 0 0 0 0 0 0 0  PHQ- 9 Score      0     Fall Risk    07/11/2022    1:43 PM 07/11/2022    1:40 PM 08/17/2021    1:23 PM 08/17/2021    1:05 PM 06/11/2021   11:23 AM  [place] in the past year? 0 0 0 0 0  Number falls in past yr: 0 0 0 0 0  Injury with Fall? 0 0 0 0 0  Risk for fall due to :     No Fall Risks  Follow up Falls evaluation completed;Education provided Falls evaluation completed;Education provided   Falls evaluation completed    [place]:  Any stairs in or around the home? No  If so, are there any without handrails? No  Home free of loose throw rugs in walkways, pet beds, electrical cords, etc? Yes  Adequate lighting in your home to reduce risk of falls? Yes   ASSISTIVE DEVICES UTILIZED TO PREVENT FALLS:  Life alert? No  Use of a cane, walker or w/c? No  Grab bars in the bathroom? No  Shower chair or bench in shower? No  Elevated toilet seat or a handicapped toilet? No     Cognitive Function:  Normal cognitive status assessed by telephone conversation  by this Nurse Health Advisor. No abnormalities found.      01/11/2019    1:41 PM  MMSE - Mini Mental State Exam  Orientation to time 5  Orientation to Place 5  Registration  3   Attention/ Calculation 5  Recall 1  Language- name 2 objects 2  Language- repeat 1  Language- follow 3 step command 3  Language- read & follow direction 1  Write a sentence 1  Copy design 1  Total score 28        07/11/2022    1:43 PM  6CIT Screen  What Year? 0 points  What month? 0 points  What time? 0 points  Count back from 20 0 points  Months in reverse 0 points  Repeat phrase 0 points  Total Score 0 points    Immunizations Immunization History  Administered Date(s) Administered   Influenza Whole 09/11/2008, 09/11/2009   Influenza, High Dose Seasonal PF 08/26/2014, 09/08/2015, 08/23/2016, 09/18/2017, 08/30/2018, 09/12/2019   Influenza,inj,Quad PF,6+ Mos 09/11/2014   Influenza-Unspecified 10/12/2018   PFIZER(Purple Top)SARS-COV-2 Vaccination 12/28/2019, 01/16/2020, 10/09/2020   Pneumococcal Conjugate-13 07/03/2014   Pneumococcal Polysaccharide-23 03/12/2004, 03/16/2009   Td 12/12/2004, 12/13/2007   Tdap 07/16/2018   Zoster Recombinat (Shingrix) 06/08/2021   Zoster, Live 06/18/2012    TDAP status: Up to date  Flu Vaccine status: Up to date  Pneumococcal vaccine status: Up to date  Covid-19 vaccine status: Completed vaccines  Qualifies for Shingles Vaccine? Yes   Zostavax completed Yes   Shingrix Completed?: Yes  Screening Tests Health Maintenance  Topic Date Due   COVID-19 Vaccine (4 - Pfizer series) 12/04/2020   Zoster Vaccines- Shingrix (2 of 2) 08/03/2021   INFLUENZA VACCINE  07/12/2022   TETANUS/TDAP  07/16/2028   Pneumonia Vaccine 47+ Years old  Completed   HPV VACCINES  Aged Out    Health Maintenance  Health Maintenance Due  Topic Date Due   COVID-19 Vaccine (4 - Pfizer series) 12/04/2020   Zoster Vaccines- Shingrix (2 of 2) 08/03/2021    Colorectal cancer screening: No longer required.   Lung Cancer Screening: (Low Dose CT Chest recommended if Age 31-80 years, 30 pack-year currently smoking OR have quit w/in 15years.) does not  qualify.   Lung Cancer Screening Referral: n/a  Additional Screening:  Hepatitis C Screening: does not qualify;   Vision Screening: Recommended annual ophthalmology exams for early detection of glaucoma and other disorders of the eye. Is the patient up to date with their annual eye exam?  Yes  Who is the provider or what is the name of the office in which the patient attends annual eye exams? Dr\.Bing Plume  If pt is not established with a provider, would they like to be referred to a provider to establish care? No .   Dental Screening: Recommended annual dental exams for proper oral hygiene  Community Resource Referral / Chronic Care Management: CRR required this visit?  No   CCM required this visit?  No      Plan:     I have personally reviewed and noted the following in the patient's chart:   Medical and social history Use of alcohol, tobacco or illicit drugs  Current medications and supplements including opioid prescriptions. Patient is not currently taking opioid prescriptions. Functional ability and status Nutritional status Physical activity Advanced directives List of other physicians Hospitalizations, surgeries, and ER visits in previous 12 months Vitals Screenings to include cognitive, depression, and falls Referrals and appointments  In addition, I have reviewed and discussed with patient certain preventive protocols, quality metrics, and best practice recommendations. A written personalized care plan for preventive services as well as general preventive health recommendations were provided to patient.     Daphane Shepherd,  LPN   [id number]   Nurse Notes: none

## 2022-08-16 ENCOUNTER — Other Ambulatory Visit: Payer: Medicare Other

## 2022-08-16 ENCOUNTER — Other Ambulatory Visit (INDEPENDENT_AMBULATORY_CARE_PROVIDER_SITE_OTHER): Payer: Medicare Other

## 2022-08-16 DIAGNOSIS — E7800 Pure hypercholesterolemia, unspecified: Secondary | ICD-10-CM | POA: Diagnosis not present

## 2022-08-16 DIAGNOSIS — E538 Deficiency of other specified B group vitamins: Secondary | ICD-10-CM | POA: Diagnosis not present

## 2022-08-16 DIAGNOSIS — E559 Vitamin D deficiency, unspecified: Secondary | ICD-10-CM | POA: Diagnosis not present

## 2022-08-16 LAB — URINALYSIS, ROUTINE W REFLEX MICROSCOPIC
Bilirubin Urine: NEGATIVE
Hgb urine dipstick: NEGATIVE
Ketones, ur: NEGATIVE
Leukocytes,Ua: NEGATIVE
Nitrite: NEGATIVE
Specific Gravity, Urine: >=1.03 — AB (ref 1.000–1.030)
Total Protein, Urine: NEGATIVE
Urine Glucose: NEGATIVE
Urobilinogen, UA: 0.2 (ref 0.0–1.0)
pH: 5.5 (ref 5.0–8.0)

## 2022-08-16 LAB — HEPATIC FUNCTION PANEL
ALT: 24 U/L (ref 0–53)
AST: 32 U/L (ref 0–37)
Albumin: 4.2 g/dL (ref 3.5–5.2)
Alkaline Phosphatase: 50 U/L (ref 39–117)
Bilirubin, Direct: 0.1 mg/dL (ref 0.0–0.3)
Total Bilirubin: 0.6 mg/dL (ref 0.2–1.2)
Total Protein: 6.6 g/dL (ref 6.0–8.3)

## 2022-08-16 LAB — BASIC METABOLIC PANEL
BUN: 20 mg/dL (ref 6–23)
CO2: 26 mEq/L (ref 19–32)
Calcium: 9.3 mg/dL (ref 8.4–10.5)
Chloride: 107 mEq/L (ref 96–112)
Creatinine, Ser: 0.81 mg/dL (ref 0.40–1.50)
GFR: 81.5 mL/min (ref >60.00)
Glucose, Bld: 90 mg/dL (ref 70–99)
Potassium: 4.1 mEq/L (ref 3.5–5.1)
Sodium: 141 mEq/L (ref 135–145)

## 2022-08-16 LAB — CBC WITH DIFFERENTIAL/PLATELET
Basophils Absolute: 0 10*3/uL (ref 0.0–0.1)
Basophils Relative: 0.8 % (ref 0.0–3.0)
Eosinophils Absolute: 0.1 10*3/uL (ref 0.0–0.7)
Eosinophils Relative: 1.5 % (ref 0.0–5.0)
HCT: 43.5 % (ref 39.0–52.0)
Hemoglobin: 14.6 g/dL (ref 13.0–17.0)
Lymphocytes Relative: 27.1 % (ref 12.0–46.0)
Lymphs Abs: 1.4 10*3/uL (ref 0.7–4.0)
MCHC: 33.6 g/dL (ref 30.0–36.0)
MCV: 92.2 fl (ref 78.0–100.0)
Monocytes Absolute: 0.5 10*3/uL (ref 0.1–1.0)
Monocytes Relative: 9.5 % (ref 3.0–12.0)
Neutro Abs: 3.2 10*3/uL (ref 1.4–7.7)
Neutrophils Relative %: 61.1 % (ref 43.0–77.0)
Platelets: 189 10*3/uL (ref 150.0–400.0)
RBC: 4.72 Mil/uL (ref 4.22–5.81)
RDW: 13.8 % (ref 11.5–15.5)
WBC: 5.3 10*3/uL (ref 4.0–10.5)

## 2022-08-16 LAB — TSH: TSH: 2.77 u[IU]/mL (ref 0.35–5.50)

## 2022-08-16 LAB — LIPID PANEL
Cholesterol: 159 mg/dL (ref 0–200)
HDL: 49.1 mg/dL (ref >39.00)
LDL Cholesterol: 91 mg/dL (ref 0–99)
NonHDL: 109.82
Total CHOL/HDL Ratio: 3
Triglycerides: 94 mg/dL (ref 0.0–149.0)
VLDL: 18.8 mg/dL (ref 0.0–40.0)

## 2022-08-16 LAB — VITAMIN D 25 HYDROXY (VIT D DEFICIENCY, FRACTURES): VITD: 39.18 ng/mL (ref 30.00–100.00)

## 2022-08-16 LAB — VITAMIN B12: Vitamin B-12: 506 pg/mL (ref 211–911)

## 2022-08-18 ENCOUNTER — Encounter: Payer: Self-pay | Admitting: Internal Medicine

## 2022-08-18 ENCOUNTER — Ambulatory Visit (INDEPENDENT_AMBULATORY_CARE_PROVIDER_SITE_OTHER): Payer: Medicare Other | Admitting: Internal Medicine

## 2022-08-18 ENCOUNTER — Ambulatory Visit (INDEPENDENT_AMBULATORY_CARE_PROVIDER_SITE_OTHER): Payer: Medicare Other

## 2022-08-18 VITALS — BP 130/60 | HR 68 | Temp 98.7°F | Ht 69.0 in | Wt 169.0 lb

## 2022-08-18 DIAGNOSIS — R059 Cough, unspecified: Secondary | ICD-10-CM

## 2022-08-18 DIAGNOSIS — E7800 Pure hypercholesterolemia, unspecified: Secondary | ICD-10-CM | POA: Diagnosis not present

## 2022-08-18 DIAGNOSIS — Z23 Encounter for immunization: Secondary | ICD-10-CM

## 2022-08-18 DIAGNOSIS — Z0001 Encounter for general adult medical examination with abnormal findings: Secondary | ICD-10-CM | POA: Diagnosis not present

## 2022-08-18 DIAGNOSIS — E538 Deficiency of other specified B group vitamins: Secondary | ICD-10-CM

## 2022-08-18 DIAGNOSIS — E559 Vitamin D deficiency, unspecified: Secondary | ICD-10-CM

## 2022-08-18 NOTE — Assessment & Plan Note (Addendum)
Lab Results  Component Value Date   VITAMINB12 506 08/16/2022   Stable, cont oral replacement - b12 1000 mcg qd  

## 2022-08-18 NOTE — Assessment & Plan Note (Signed)
Etiology unclear, has hx of melanoma, for cxr

## 2022-08-18 NOTE — Patient Instructions (Addendum)
You had the flu shot today  Please continue all other medications as before, and refills have been done if requested.  Please have the pharmacy call with any other refills you may need.  Please continue your efforts at being more active, low cholesterol diet, and weight control.  You are otherwise up to date with prevention measures today.  Please keep your appointments with your specialists as you may have planned  Please go to the XRAY Department in the first floor for the x-ray testing  You will be contacted by phone if any changes need to be made immediately.  Otherwise, you will receive a letter about your results with an explanation, but please check with MyChart first..  Please remember to sign up for MyChart if you have not done so, as this will be important to you in the future with finding out test results, communicating by private email, and scheduling acute appointments online when needed.  Please make an Appointment to return for your 1 year visit, or sooner if needed, with Lab testing by Appointment as well, to be done about 3-5 days before at the [facility] (so this is for TWO appointments - please see the scheduling desk as you leave)

## 2022-08-18 NOTE — Assessment & Plan Note (Addendum)
Age and sex appropriate education and counseling updated with regular exercise and diet Referrals for preventative services - none needed Immunizations addressed - declines covid booster, for flu shot today Smoking counseling  - none needed Evidence for depression or other mood disorder - none significant Most recent labs reviewed. I have personally reviewed and have noted: 1) the patient's medical and social history 2) The patient's current medications and supplements 3) The patient's height, weight, and BMI have been recorded in the chart

## 2022-08-18 NOTE — Assessment & Plan Note (Signed)
Lab Results  Component Value Date   LDLCALC 91 08/16/2022   Stable, pt to continue current low chol diet, declines statin

## 2022-08-18 NOTE — Progress Notes (Signed)
Patient ID: Alexander Castillo, male   DOB: 08-16-1941, 81 y.o.   MRN: [id number]         Chief Complaint:: wellness exam and low b12, cough, hld       HPI:  Alexander Castillo is a 81 y.o. male here for wellness exam; declines covid booster, ok for flu shot, o/w up to date                        Also sees derm yearly with hx of melanoma.  Pt denies chest pain, increased sob or doe, wheezing, orthopnea, PND, increased LE swelling, palpitations, dizziness or syncope, but does have a non prod peresistent cough in the past wk, denies fever, chills.   Pt denies polydipsia, polyuria, or new focal neuro s/s.    Pt denies fever, wt loss, night sweats, loss of appetite, or other constitutional symptoms  Took oral B12 for 90 days last yr, then stopped.   Wt Readings from Last 3 Encounters:  08/18/22 169 lb (76.7 kg)  08/17/21 169 lb (76.7 kg)  12/31/20 171 lb 1.2 oz (77.6 kg)   BP Readings from Last 3 Encounters:  08/18/22 130/60  08/17/21 132/68  12/31/20 125/64   Immunization History  Administered Date(s) Administered   Fluad Quad(high Dose 65+) 08/18/2022   Influenza Whole 09/11/2008, 09/11/2009   Influenza, High Dose Seasonal PF 08/26/2014, 09/08/2015, 08/23/2016, 09/18/2017, 08/30/2018, 09/12/2019   Influenza,inj,Quad PF,6+ Mos 09/11/2014   Influenza-Unspecified 10/12/2018   PFIZER(Purple Top)SARS-COV-2 Vaccination 12/28/2019, 01/16/2020, 10/09/2020   Pneumococcal Conjugate-13 07/03/2014   Pneumococcal Polysaccharide-23 03/12/2004, 03/16/2009   Td 12/12/2004, 12/13/2007   Tdap 07/16/2018   Zoster Recombinat (Shingrix) 06/08/2021   Zoster, Live 06/18/2012   There are no preventive care reminders to display for this patient.     Past Medical History:  Diagnosis Date   BENIGN PROSTATIC HYPERTROPHY 03/10/2008   BENIGN PROSTATIC HYPERTROPHY, HX OF 03/10/2008   CHEST PAIN 03/10/2008   at pre-op 01-17-18: per patient had cardiac eval at that time was sent for test (uaware type) ; reports  nothing was abnormal; no recurrence of chest pain    DEGENERATIVE JOINT DISEASE 03/10/2008   EAR PAIN, LEFT 12/09/2008   HYPERLIPIDEMIA 03/10/2008   Osteoarthritis    left ankle   PSA, INCREASED 03/16/2009   RASH-NONVESICULAR 03/16/2009   Past Surgical History:  Procedure Laterality Date   ACHILLES TENDON LENGTHENING Left 12/31/2020   Procedure: Percutaneous Achilles Tendon Lengthening;  Surgeon: Wylene Simmer, MD;  Location: [place];  Service: Orthopedics;  Laterality: Left;   back [place]   lower   EYE SURGERY Bilateral    lens replacement for cataracts   hip replacement left Left 2002   JOINT REPLACEMENT Right    knee 2019, left hip 2017   s/p left knee arthroscopy  yrs ago   TOTAL ANKLE ARTHROPLASTY Left 12/31/2020   Procedure: Left total ankle replacement;  Surgeon: Wylene Simmer, MD;  Location: [facility];  Service: Orthopedics;  Laterality: Left;   TOTAL HIP ARTHROPLASTY Right 06/13/2016   Procedure: RIGHT TOTAL HIP ARTHROPLASTY ANTERIOR APPROACH;  Surgeon: Paralee Cancel, MD;  Location: [facility];  Service: Orthopedics;  Laterality: Right;  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   TOTAL KNEE ARTHROPLASTY Right 01/23/2018   Procedure: RIGHT TOTAL KNEE ARTHROPLASTY;  Surgeon: Paralee Cancel, MD;  Location: [facility];  Service: Orthopedics;  Laterality: Right;  70 mins   transurethral resection of prostate  reports that he has never smoked. He has quit using smokeless tobacco. He reports that he does not drink alcohol and does not use drugs. family history includes Dementia in his mother. No Known Allergies No current outpatient medications on file prior to visit.   No current facility-administered medications on file prior to visit.        ROS:  All others reviewed and negative.  Objective        PE:  BP 130/60 (BP Location: Right Arm, Patient Position: Sitting, Cuff Size: Large)   Pulse 68   Temp 98.7 F (37.1 C) (Oral)   Ht '5\' 9"'$  (1.753 m)   Wt 169 lb (76.7  kg)   SpO2 96%   BMI 24.96 kg/m                 Constitutional: Pt appears in NAD               HENT: Head: NCAT.                Right Ear: External ear normal.                 Left Ear: External ear normal.                Eyes: . Pupils are equal, round, and reactive to light. Conjunctivae and EOM are normal               Nose: without d/c or deformity               Neck: Neck supple. Gross normal ROM               Cardiovascular: Normal rate and regular rhythm.                 Pulmonary/Chest: Effort normal and breath sounds without rales or wheezing.                Abd:  Soft, NT, ND, + BS, no organomegaly               Neurological: Pt is alert. At baseline orientation, motor grossly intact               Skin: Skin is warm. No rashes, no other new lesions, LE edema - none               Psychiatric: Pt behavior is normal without agitation   Micro: none  Cardiac tracings I have personally interpreted today:  none  Pertinent Radiological findings (summarize): none   Lab Results  Component Value Date   WBC 5.3 08/16/2022   HGB 14.6 08/16/2022   HCT 43.5 08/16/2022   PLT 189.0 08/16/2022   GLUCOSE 90 08/16/2022   CHOL 159 08/16/2022   TRIG 94.0 08/16/2022   HDL 49.10 08/16/2022   LDLCALC 91 08/16/2022   ALT 24 08/16/2022   AST 32 08/16/2022   NA 141 08/16/2022   K 4.1 08/16/2022   CL 107 08/16/2022   CREATININE 0.81 08/16/2022   BUN 20 08/16/2022   CO2 26 08/16/2022   TSH 2.77 08/16/2022   PSA 2.07 08/11/2021   Assessment/Plan:  [name] is a 81 y.o. White or Caucasian [1] male with  has a past medical history of BENIGN PROSTATIC HYPERTROPHY (03/10/2008), BENIGN PROSTATIC HYPERTROPHY, HX OF (03/10/2008), CHEST PAIN (03/10/2008), DEGENERATIVE JOINT DISEASE (03/10/2008), EAR PAIN, LEFT (12/09/2008), HYPERLIPIDEMIA (03/10/2008), Osteoarthritis, PSA, INCREASED (03/16/2009), and RASH-NONVESICULAR (03/16/2009).  B12 deficiency Lab Results  Component Value Date    [id number] 287 08/16/2022   Stable, cont oral replacement - b12 1000 mcg qd    Hyperlipidemia Lab Results  Component Value Date   LDLCALC 91 08/16/2022   Stable, pt to continue current low chol diet, declines statin  Encounter for well adult exam with abnormal findings Age and sex appropriate education and counseling updated with regular exercise and diet Referrals for preventative services - none needed Immunizations addressed - declines covid booster, for flu shot today Smoking counseling  - none needed Evidence for depression or other mood disorder - none significant Most recent labs reviewed. I have personally reviewed and have noted: 1) the patient's medical and social history 2) The patient's current medications and supplements 3) The patient's height, weight, and BMI have been recorded in the chart   Cough Etiology unclear, has hx of melanoma, for cxr Followup: Return in about 1 year (around 08/19/2023).  Cathlean Cower, MD 08/18/2022 1:40 PM [facility]

## 2022-09-14 ENCOUNTER — Telehealth: Payer: Self-pay

## 2022-09-14 NOTE — Patient Outreach (Addendum)
  Care Coordination   Initial Visit Note   09/14/2022 Name: Alexander Castillo MRN: [id number] DOB: 1941/01/06  Alexander Castillo is a 81 y.o. year old male who sees Biagio Borg, MD for primary care. I spoke with  Alexander Castillo by phone today.  What matters to the patients health and wellness today?  Patient denies any care coordination, resource or disease management needs at this time. Declines assessment.   Goals Addressed             This Visit's Progress    COMPLETED: Care Coordination Activities-no follow up required       Care Coordination Interventions: Discussed care coordination program Encouraged patient to contact primary care provider if care coordination needs change in the future.       SDOH assessments and interventions completed:  Yes  SDOH Interventions Today    Flowsheet Row Most Recent Value  SDOH Interventions   Food Insecurity Interventions Intervention Not Indicated  Transportation Interventions Intervention Not Indicated      Care Coordination Interventions Activated:  Yes  Care Coordination Interventions:  Yes, provided   Follow up plan: No further intervention required.   Encounter Outcome:  Pt. Refused   Thea Silversmith, RN, MSN, BSN, [place] Coordinator [phone]

## 2022-11-14 DIAGNOSIS — L812 Freckles: Secondary | ICD-10-CM | POA: Diagnosis not present

## 2022-11-14 DIAGNOSIS — L720 Epidermal cyst: Secondary | ICD-10-CM | POA: Diagnosis not present

## 2022-11-14 DIAGNOSIS — L570 Actinic keratosis: Secondary | ICD-10-CM | POA: Diagnosis not present

## 2022-11-14 DIAGNOSIS — L821 Other seborrheic keratosis: Secondary | ICD-10-CM | POA: Diagnosis not present

## 2022-11-14 DIAGNOSIS — Z85820 Personal history of malignant melanoma of skin: Secondary | ICD-10-CM | POA: Diagnosis not present

## 2022-11-14 DIAGNOSIS — D1801 Hemangioma of skin and subcutaneous tissue: Secondary | ICD-10-CM | POA: Diagnosis not present

## 2022-12-19 ENCOUNTER — Ambulatory Visit
Admission: EM | Admit: 2022-12-19 | Discharge: 2022-12-19 | Disposition: A | Discharge status: Home / Self Care | Payer: Medicare Other | Attending: Emergency Medicine | Admitting: Emergency Medicine

## 2022-12-19 DIAGNOSIS — N3001 Acute cystitis with hematuria: Secondary | ICD-10-CM | POA: Insufficient documentation

## 2022-12-19 LAB — POCT URINALYSIS DIP (MANUAL ENTRY)
Bilirubin, UA: NEGATIVE
Glucose, UA: NEGATIVE mg/dL
Nitrite, UA: NEGATIVE
Protein Ur, POC: 100 mg/dL — AB
Spec Grav, UA: >=1.03 — AB (ref 1.010–1.025)
Urobilinogen, UA: 0.2 E.U./dL
pH, UA: 5.5 (ref 5.0–8.0)

## 2022-12-19 MED ORDER — SULFAMETHOXAZOLE-TRIMETHOPRIM 800-160 MG PO TABS: 1.0000 | ORAL_TABLET | Freq: Two times a day (BID) | ORAL | 0 refills | Status: AC

## 2022-12-19 NOTE — ED Provider Notes (Signed)
[facility]    CSN: [id number] Arrival date & time: 12/19/22  0900    HISTORY   Chief Complaint  Patient presents with   Urinary Incontinence   HPI Alexander Castillo is a pleasant, 82 y.o. male who presents to urgent care today. Pt c/o 1 day hx swelling to left groin and  urinary incontinence. The patient states yesterday morning he notice blood in his urine.      Hematuria This is a new problem. The current episode started yesterday. The problem has been gradually worsening. Pertinent negatives include no chest pain, no abdominal pain, no headaches and no shortness of breath. Nothing aggravates the symptoms. Nothing relieves the symptoms. He has tried nothing for the symptoms.   Past Medical History:  Diagnosis Date   BENIGN PROSTATIC HYPERTROPHY 03/10/2008   BENIGN PROSTATIC HYPERTROPHY, HX OF 03/10/2008   CHEST PAIN 03/10/2008   at pre-op 01-17-18: per patient had cardiac eval at that time was sent for test (uaware type) ; reports nothing was abnormal; no recurrence of chest pain    DEGENERATIVE JOINT DISEASE 03/10/2008   EAR PAIN, LEFT 12/09/2008   HYPERLIPIDEMIA 03/10/2008   Osteoarthritis    left ankle   PSA, INCREASED 03/16/2009   RASH-NONVESICULAR 03/16/2009   Patient Active Problem List   Diagnosis Date Noted   Cough 08/18/2022   Carpal tunnel syndrome of left wrist 03/12/2022   Numbness of hand 03/12/2022   Arthritis of right wrist 10/14/2021   Extensor tenosynovitis of wrist 10/14/2021   B12 deficiency 08/17/2021   Baker cyst, left 08/17/2021   Pain of left hand 07/13/2021   Osteoarthritis of left ankle 12/31/2020   Full thickness rotator cuff tear 02/06/2020   Dysuria 11/13/2019   Impingement syndrome of right shoulder region 07/29/2019   Overweight (BMI 25.0-29.9) 01/24/2018   S/P right TKA 01/23/2018   Pain in right knee 01/02/2018   Status post total replacement of right hip 06/13/2016   BPPV (benign paroxysmal positional vertigo) 02/23/2015    Encounter for well adult exam with abnormal findings 06/14/2011   Depression 06/14/2011   PSA, INCREASED 03/16/2009   Hyperlipidemia 03/10/2008   BPH (benign prostatic hyperplasia) 03/10/2008   DEGENERATIVE JOINT DISEASE 03/10/2008   Past Surgical History:  Procedure Laterality Date   ACHILLES TENDON LENGTHENING Left 12/31/2020   Procedure: Percutaneous Achilles Tendon Lengthening;  Surgeon: Wylene Simmer, MD;  Location: [place];  Service: Orthopedics;  Laterality: Left;   back [place]   lower   EYE SURGERY Bilateral    lens replacement for cataracts   hip replacement left Left 2002   JOINT REPLACEMENT Right    knee 2019, left hip 2017   s/p left knee arthroscopy  yrs ago   TOTAL ANKLE ARTHROPLASTY Left 12/31/2020   Procedure: Left total ankle replacement;  Surgeon: Wylene Simmer, MD;  Location: [place];  Service: Orthopedics;  Laterality: Left;   TOTAL HIP ARTHROPLASTY Right 06/13/2016   Procedure: RIGHT TOTAL HIP ARTHROPLASTY ANTERIOR APPROACH;  Surgeon: Paralee Cancel, MD;  [facility];  Service: Orthopedics;  Laterality: Right;  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   TOTAL KNEE ARTHROPLASTY Right 01/23/2018   Procedure: RIGHT TOTAL KNEE ARTHROPLASTY;  Surgeon: Paralee Cancel, MD;  [facility];  Service: Orthopedics;  Laterality: Right;  70 mins   transurethral resection of prostate      Home Medications    Prior to Admission medications   Not on File    Family History Family  History  Problem Relation Age of Onset   Dementia Mother    Social History Social History   Tobacco Use   Smoking status: Never   Smokeless tobacco: Former  Scientific laboratory technician Use: Never used  Substance Use Topics   Alcohol use: No   Drug use: No   Allergies   Patient has no known allergies.  Review of Systems Review of Systems  Respiratory:  Negative for shortness of breath.   Cardiovascular:  Negative for chest pain.  Gastrointestinal:  Negative for  abdominal pain.  Genitourinary:  Positive for hematuria.  Neurological:  Negative for headaches.   Pertinent findings revealed after performing a 14 point review of systems has been noted in the history of present illness.  Physical Exam Triage Vital Signs ED Triage Vitals  Enc Vitals Group     BP 10/08/21 0827 (!) 147/82     Pulse Rate 10/08/21 0827 72     Resp 10/08/21 0827 18     Temp 10/08/21 0827 98.3 F (36.8 C)     Temp Source 10/08/21 0827 Oral     SpO2 10/08/21 0827 98 %     Weight --      Height --      Head Circumference --      Peak Flow --      Pain Score 10/08/21 0826 5     Pain Loc --      Pain Edu? --      Excl. in [place]? --   No data found.  Updated Vital Signs BP 134/62 (BP Location: Left Arm)   Pulse 83   Temp 98.1 F (36.7 C) (Oral)   Resp 16   SpO2 97%   Physical Exam Vitals and nursing note reviewed.  Constitutional:      General: He is not in acute distress.    Appearance: Normal appearance. He is normal weight. He is not ill-appearing.  HENT:     Head: Normocephalic and atraumatic.  Eyes:     Extraocular Movements: Extraocular movements intact.     Conjunctiva/sclera: Conjunctivae normal.     Pupils: Pupils are equal, round, and reactive to light.  Cardiovascular:     Rate and Rhythm: Normal rate and regular rhythm.  Pulmonary:     Effort: Pulmonary effort is normal.     Breath sounds: Normal breath sounds.  Abdominal:     General: Abdomen is flat. Bowel sounds are normal.     Palpations: Abdomen is soft.  Musculoskeletal:        General: Normal range of motion.     Cervical back: Normal range of motion and neck supple.  Skin:    General: Skin is warm and dry.  Neurological:     General: No focal deficit present.     Mental Status: He is alert and oriented to person, place, and time. Mental status is at baseline.  Psychiatric:        Mood and Affect: Mood normal.        Behavior: Behavior normal.        Thought Content: Thought  content normal.        Judgment: Judgment normal.     Visual Acuity Right Eye Distance:   Left Eye Distance:   Bilateral Distance:    Right Eye Near:   Left Eye Near:    Bilateral Near:     UC Couse / Diagnostics / Procedures:     Radiology No results found.  Procedures Procedures (including critical care time) EKG  Pending results:  Labs Reviewed  POCT URINALYSIS DIP (MANUAL ENTRY) - Abnormal; Notable for the following components:      Result Value   Clarity, UA hazy (*)    Ketones, POC UA moderate (40) (*)    Spec Grav, UA >=1.030 (*)    Blood, UA moderate (*)    Protein Ur, POC =100 (*)    Leukocytes, UA Small (1+) (*)    All other components within normal limits  URINE CULTURE    Medications Ordered in UC: Medications - No data to display  UC Diagnoses / Final Clinical Impressions(s)   I have reviewed the triage vital signs and the nursing notes.  Pertinent labs & imaging results that were available during my care of the patient were reviewed by me and considered in my medical decision making (see chart for details).    Final diagnoses:  Acute cystitis with hematuria   Urine dip today was positive.   Patient was advised to begin antibiotics now due to findings on urine dip. Patient advised that they will be contacted with results and that adjustments to treatment will be provided as indicated based on the results.   Patient was advised of possibility that urine culture results may be negative if sample provided was obtained late in the day causing urine to be more diluted.  Patient was advised that if antibiotics were effective after the first 24 to 36 hours, despite negative urine culture result, it is recommended that they complete the full course as prescribed.   Return precautions advised.  Please see discharge instructions below for details of plan of care as provided to patient. ED Prescriptions     Medication Sig Dispense Auth. Provider    sulfamethoxazole-trimethoprim (BACTRIM DS) 800-160 MG tablet Take 1 tablet by mouth 2 (two) times daily for 7 days. 14 tablet Lynden Oxford Scales, PA-C      PDMP not reviewed this encounter.  Disposition Upon Discharge:  Condition: stable for discharge home  Patient presented with concern for an acute illness with associated systemic symptoms and significant discomfort requiring urgent management. In my opinion, this is a condition that a prudent lay person (someone who possesses an average knowledge of health and medicine) may potentially expect to result in complications if not addressed urgently such as respiratory distress, impairment of bodily function or dysfunction of bodily organs.   As such, the patient has been evaluated and assessed, work-up was performed and treatment was provided in alignment with urgent care protocols and evidence based medicine.  Patient/parent/caregiver has been advised that the patient may require follow up for further testing and/or treatment if the symptoms continue in spite of treatment, as clinically indicated and appropriate.  Routine symptom specific, illness specific and/or disease specific instructions were discussed with the patient and/or caregiver at length.  Prevention strategies for avoiding STD exposure were also discussed.  The patient will follow up with their current PCP if and as advised. If the patient does not currently have a PCP we will assist them in obtaining one.   The patient may need specialty follow up if the symptoms continue, in spite of conservative treatment and management, for further workup, evaluation, consultation and treatment as clinically indicated and appropriate.  Patient/parent/caregiver verbalized understanding and agreement of plan as discussed.  All questions were addressed during visit.  Please see discharge instructions below for further details of plan.  Discharge Instructions:   Discharge Instructions  The urinalysis that we performed in the clinic today was abnormal.  Urine culture will be performed per our protocol.  The result of the urine culture will be available in the next 3 to 5 days and will be posted to your MyChart account.  If there is an abnormal finding, you will be contacted by phone and advised of further treatment recommendations, if any.   Please pick up and begin taking your prescription for Bactrim DS (trimethoprim sulfamethoxazole) as soon as possible.  Please take all doses exactly as prescribed.  You can take this medication with or without food.  This medication is safe to take with your other medications.   If you have not had complete resolution of your symptoms after completing treatment as prescribed, please return to urgent care for repeat evaluation or follow-up with your primary care provider.  Repeat urinalysis and urine culture may be indicated for more directed therapy.   Thank you for visiting urgent care today.  I appreciate the opportunity to participate in your care.       This office note has been dictated using Museum/gallery curator.  Unfortunately, this method of dictation can sometimes lead to typographical or grammatical errors.  I apologize for your inconvenience in advance if this occurs.  Please do not hesitate to reach out to me if clarification is needed.       Lynden Oxford Scales, [place] 12/19/22 [phone]

## 2022-12-19 NOTE — ED Notes (Signed)
Patient in restroom.

## 2022-12-19 NOTE — Discharge Instructions (Addendum)
The urinalysis that we performed in the clinic today was abnormal.  Urine culture will be performed per our protocol.  The result of the urine culture will be available in the next 3 to 5 days and will be posted to your MyChart account.  If there is an abnormal finding, you will be contacted by phone and advised of further treatment recommendations, if any.   Please pick up and begin taking your prescription for Bactrim DS (trimethoprim sulfamethoxazole) as soon as possible.  Please take all doses exactly as prescribed.  You can take this medication with or without food.  This medication is safe to take with your other medications.   If you have not had complete resolution of your symptoms after completing treatment as prescribed, please return to urgent care for repeat evaluation or follow-up with your primary care provider.  Repeat urinalysis and urine culture may be indicated for more directed therapy.   Thank you for visiting urgent care today.  I appreciate the opportunity to participate in your care.

## 2022-12-19 NOTE — ED Triage Notes (Signed)
Pt c/o swelling to left groin and  urinary incontinence. The patient states yesterday morning he had some hematuria.   Started: yesterday

## 2022-12-21 DIAGNOSIS — H26493 Other secondary cataract, bilateral: Secondary | ICD-10-CM | POA: Diagnosis not present

## 2022-12-21 DIAGNOSIS — H02831 Dermatochalasis of right upper eyelid: Secondary | ICD-10-CM | POA: Diagnosis not present

## 2022-12-21 DIAGNOSIS — H353132 Nonexudative age-related macular degeneration, bilateral, intermediate dry stage: Secondary | ICD-10-CM | POA: Diagnosis not present

## 2022-12-21 DIAGNOSIS — H43813 Vitreous degeneration, bilateral: Secondary | ICD-10-CM | POA: Diagnosis not present

## 2022-12-21 DIAGNOSIS — H02834 Dermatochalasis of left upper eyelid: Secondary | ICD-10-CM | POA: Diagnosis not present

## 2022-12-21 LAB — URINE CULTURE: Culture: >=100000 — AB

## 2023-01-03 ENCOUNTER — Ambulatory Visit
Admission: EM | Admit: 2023-01-03 | Discharge: 2023-01-03 | Disposition: A | Discharge status: Home / Self Care | Payer: Medicare Other | Attending: Nurse Practitioner | Admitting: Nurse Practitioner

## 2023-01-03 DIAGNOSIS — N5089 Other specified disorders of the male genital organs: Secondary | ICD-10-CM | POA: Diagnosis not present

## 2023-01-03 DIAGNOSIS — N3001 Acute cystitis with hematuria: Secondary | ICD-10-CM

## 2023-01-03 LAB — POCT URINALYSIS DIP (MANUAL ENTRY)
Bilirubin, UA: NEGATIVE
Blood, UA: NEGATIVE
Glucose, UA: NEGATIVE mg/dL
Ketones, POC UA: NEGATIVE mg/dL
Nitrite, UA: NEGATIVE
Protein Ur, POC: NEGATIVE mg/dL
Spec Grav, UA: 1.025 (ref 1.010–1.025)
Urobilinogen, UA: 0.2 E.U./dL
pH, UA: 6 (ref 5.0–8.0)

## 2023-01-03 MED ORDER — LEVOFLOXACIN 500 MG PO TABS: 500.0000 mg | ORAL_TABLET | Freq: Every day | ORAL | 0 refills | Status: DC

## 2023-01-03 NOTE — ED Provider Notes (Signed)
[facility]    CSN: [id number] Arrival date & time: 01/03/23  1049      History   Chief Complaint Chief Complaint  Patient presents with   Testicle Pain    HPI Alexander Castillo is a 82 y.o. male presents for evaluation of testicular swelling.  Patient was seen in urgent care on 1/8 with swelling to his left groin and urinary incontinence.  No testicular exam was performed.  UA was positive for UTI and he was started on Bactrim for 7 days.  He states his urinary symptoms resolved but he still has swelling of his left testicle that has slightly improved from his initial visit.  Denies pain to the testicle, erythema, warmth.  No difficulty urinating or starting/stopping the urine stream.  Denies history of epididymitis.  Denies injury to the testicle.  No other concerns at this time.   Testicle Pain    Past Medical History:  Diagnosis Date   BENIGN PROSTATIC HYPERTROPHY 03/10/2008   BENIGN PROSTATIC HYPERTROPHY, HX OF 03/10/2008   CHEST PAIN 03/10/2008   at pre-op 01-17-18: per patient had cardiac eval at that time was sent for test (uaware type) ; reports nothing was abnormal; no recurrence of chest pain    DEGENERATIVE JOINT DISEASE 03/10/2008   EAR PAIN, LEFT 12/09/2008   HYPERLIPIDEMIA 03/10/2008   Osteoarthritis    left ankle   PSA, INCREASED 03/16/2009   RASH-NONVESICULAR 03/16/2009    Patient Active Problem List   Diagnosis Date Noted   Cough 08/18/2022   Carpal tunnel syndrome of left wrist 03/12/2022   Numbness of hand 03/12/2022   Arthritis of right wrist 10/14/2021   Extensor tenosynovitis of wrist 10/14/2021   B12 deficiency 08/17/2021   Baker cyst, left 08/17/2021   Pain of left hand 07/13/2021   Osteoarthritis of left ankle 12/31/2020   Full thickness rotator cuff tear 02/06/2020   Dysuria 11/13/2019   Impingement syndrome of right shoulder region 07/29/2019   Overweight (BMI 25.0-29.9) 01/24/2018   S/P right TKA 01/23/2018   Pain in right knee  01/02/2018   Status post total replacement of right hip 06/13/2016   BPPV (benign paroxysmal positional vertigo) 02/23/2015   Encounter for well adult exam with abnormal findings 06/14/2011   Depression 06/14/2011   PSA, INCREASED 03/16/2009   Hyperlipidemia 03/10/2008   BPH (benign prostatic hyperplasia) 03/10/2008   DEGENERATIVE JOINT DISEASE 03/10/2008    Past Surgical History:  Procedure Laterality Date   ACHILLES TENDON LENGTHENING Left 12/31/2020   Procedure: Percutaneous Achilles Tendon Lengthening;  Surgeon: Wylene Simmer, MD;  Location: [place];  Service: Orthopedics;  Laterality: Left;   back [place]   lower   EYE SURGERY Bilateral    lens replacement for cataracts   hip replacement left Left 2002   JOINT REPLACEMENT Right    knee 2019, left hip 2017   s/p left knee arthroscopy  yrs ago   TOTAL ANKLE ARTHROPLASTY Left 12/31/2020   Procedure: Left total ankle replacement;  Surgeon: Wylene Simmer, MD;  Location: [place];  Service: Orthopedics;  Laterality: Left;   TOTAL HIP ARTHROPLASTY Right 06/13/2016   Procedure: RIGHT TOTAL HIP ARTHROPLASTY ANTERIOR APPROACH;  Surgeon: Paralee Cancel, MD;  Location: [facility];  Service: Orthopedics;  Laterality: Right;  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   TOTAL KNEE ARTHROPLASTY Right 01/23/2018   Procedure: RIGHT TOTAL KNEE ARTHROPLASTY;  Surgeon: Paralee Cancel, MD;  Location: [facility];  Service: Orthopedics;  Laterality: Right;  70  mins   transurethral resection of prostate         Home Medications    Prior to Admission medications   Medication Sig Start Date End Date Taking? Authorizing Provider  levofloxacin (LEVAQUIN) 500 MG tablet Take 1 tablet (500 mg total) by mouth daily. 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   Review of Systems Review of Systems  Genitourinary:  Positive for testicular pain.     Physical Exam Triage Vital Signs ED Triage Vitals  Enc Vitals Group     BP 01/03/23 1110 (!) 140/69     Pulse Rate 01/03/23 1110 74     Resp 01/03/23 1110 16     Temp 01/03/23 1110 (!) 97.4 F (36.3 C)     Temp Source 01/03/23 1110 Oral     SpO2 01/03/23 1110 97 %     Weight --      Height --      Head Circumference --      Peak Flow --      Pain Score 01/03/23 1109 0     Pain Loc --      Pain Edu? --      Excl. in [place]? --    No data found.  Updated Vital Signs BP (!) 140/69 (BP Location: Right Arm)   Pulse 74   Temp (!) 97.4 F (36.3 C) (Oral)   Resp 16   SpO2 97%   Visual Acuity Right Eye Distance:   Left Eye Distance:   Bilateral Distance:    Right Eye Near:   Left Eye Near:    Bilateral Near:     Physical Exam Vitals and nursing note reviewed. Exam conducted with a chaperone present Water engineer).  Constitutional:      Appearance: Normal appearance.  HENT:     Head: Normocephalic and atraumatic.  Eyes:     Pupils: Pupils are equal, round, and reactive to light.  Cardiovascular:     Rate and Rhythm: Normal rate.  Pulmonary:     Effort: Pulmonary effort is normal.  Abdominal:     Tenderness: There is no left CVA tenderness.  Genitourinary:    Penis: Circumcised.      Testes: Cremasteric reflex is present.        Left: Swelling present. Tenderness not present. Cremasteric reflex is present.      Comments: Moderate swelling of the left testicle without pain.  No tenderness along the epididymitis.  No inguinal pain. Skin:    General: Skin is warm and dry.  Neurological:     General: No focal deficit present.     Mental Status: He is alert and oriented to person, place, and time.  Psychiatric:        Mood and Affect: Mood  normal.        Behavior: Behavior normal.      UC Treatments / Results  Labs (all labs ordered are listed, but only abnormal results are displayed) Labs Reviewed  POCT URINALYSIS DIP (MANUAL ENTRY) - Abnormal; Notable for the following components:  Result Value   Leukocytes, UA Small (1+) (*)    All other components within normal limits  URINE CULTURE    EKG   Radiology No results found.  Procedures Procedures (including critical care time)  Medications Ordered in UC Medications - No data to display  Initial Impression / Assessment and Plan / UC Course  I have reviewed the triage vital signs and the nursing notes.  Pertinent labs & imaging results that were available during my care of the patient were reviewed by me and considered in my medical decision making (see chart for details).     Reviewed exam and symptoms with patient.  No testicular pain on exam.  No signs of torsion.  UA still shows evidence of UTI.  Reviewed urine culture from 1/8 that showed Klebsiella pneumonia that was sensitive to Bactrim.  Will culture urine from today and start Levaquin to cover for any testicular/epididymitis infection as well.  Side effect profile of the antibiotic reviewed with patient and he verbalized understanding Instructed patient to contact his PCP today inform them of his testicular swelling so that he can be seen asap for possible imaging and he verbalized understanding Rest and fluids Strict ER precautions reviewed (i.e. increase in testicular swelling, pain, fevers, chills, difficulty urinating or inability to urinate, etc.) discussed with patient and he verbalized understanding Final Clinical Impressions(s) / UC Diagnoses   Final diagnoses:  Testicular swelling, left  Acute cystitis with hematuria     Discharge Instructions      Start Levaquin daily for 7 days We will send urine for culture and contact you with those results Please call your PCP 2-day and  schedule a follow-up appointment and possible imaging for your left testicular swelling Please go to the emergency room if you have any worsening symptoms.  This includes but is not limited to fever, chills, worsening swelling, testicular pain, difficulty or inability to urinate, or any new concerns that arise     ED Prescriptions     Medication Sig Dispense Auth. Provider   levofloxacin (LEVAQUIN) 500 MG tablet Take 1 tablet (500 mg total) by mouth daily. 7 tablet Melynda Ripple, NP      PDMP not reviewed this encounter.   Melynda Ripple, NP 01/03/23 1144

## 2023-01-03 NOTE — ED Triage Notes (Signed)
Pt c/o swelling to left testicle. The pt denies difficulty urinating.   Started around 12/18/2022

## 2023-01-03 NOTE — ED Notes (Signed)
Assisted provider as chaperone.

## 2023-01-03 NOTE — Discharge Instructions (Addendum)
Start Levaquin daily for 7 days We will send urine for culture and contact you with those results Please call your PCP 2-day and schedule a follow-up appointment and possible imaging for your left testicular swelling Please go to the emergency room if you have any worsening symptoms.  This includes but is not limited to fever, chills, worsening swelling, testicular pain, difficulty or inability to urinate, or any new concerns that arise

## 2023-01-04 LAB — URINE CULTURE: Culture: NO GROWTH

## 2023-01-05 ENCOUNTER — Ambulatory Visit (INDEPENDENT_AMBULATORY_CARE_PROVIDER_SITE_OTHER): Payer: Medicare Other | Admitting: Internal Medicine

## 2023-01-05 VITALS — BP 118/60 | HR 72 | Temp 98.2°F | Ht 69.0 in | Wt 174.0 lb

## 2023-01-05 DIAGNOSIS — Z0001 Encounter for general adult medical examination with abnormal findings: Secondary | ICD-10-CM | POA: Diagnosis not present

## 2023-01-05 DIAGNOSIS — E7800 Pure hypercholesterolemia, unspecified: Secondary | ICD-10-CM | POA: Diagnosis not present

## 2023-01-05 DIAGNOSIS — N453 Epididymo-orchitis: Secondary | ICD-10-CM | POA: Diagnosis not present

## 2023-01-05 DIAGNOSIS — E538 Deficiency of other specified B group vitamins: Secondary | ICD-10-CM | POA: Diagnosis not present

## 2023-01-05 DIAGNOSIS — F32A Depression, unspecified: Secondary | ICD-10-CM

## 2023-01-05 DIAGNOSIS — N3001 Acute cystitis with hematuria: Secondary | ICD-10-CM

## 2023-01-05 DIAGNOSIS — N5089 Other specified disorders of the male genital organs: Secondary | ICD-10-CM

## 2023-01-05 MED ORDER — LEVOFLOXACIN 500 MG PO TABS: 500.0000 mg | ORAL_TABLET | Freq: Every day | ORAL | 0 refills | Status: DC

## 2023-01-05 NOTE — Progress Notes (Signed)
Patient ID: Alexander Castillo, male   DOB: 1941/11/27, 82 y.o.   MRN: [id number]         Chief Complaint:: wellness exam and ED follow up (Swelling in testicles and patient is requesting imaging)  , low b12. Depression, hld       HPI:  Alexander Castillo is a 82 y.o. male here for wellness exam; declines covid booster, o/w up to date                        Also seen at ED recently with start levaquin course for orchitis, overall at least 50 percent improved but still mild to mod tender, swelling, asking for imaging follow up.  Pt denies chest pain, increased sob or doe, wheezing, orthopnea, PND, increased LE swelling, palpitations, dizziness or syncope.   Pt denies polydipsia, polyuria, or new focal neuro s/s.    Pt denies fever, wt loss, night sweats, loss of appetite, or other constitutional symptoms  Denies worsening depressive symptoms, suicidal ideation, or panic   Wt Readings from Last 3 Encounters:  01/05/23 174 lb (78.9 kg)  08/18/22 169 lb (76.7 kg)  08/17/21 169 lb (76.7 kg)   BP Readings from Last 3 Encounters:  01/05/23 118/60  01/03/23 (!) 140/69  12/19/22 134/62   Immunization History  Administered Date(s) Administered   Fluad Quad(high Dose 65+) 08/18/2022   Influenza Whole 09/11/2008, 09/11/2009   Influenza, High Dose Seasonal PF 08/26/2014, 09/08/2015, 08/23/2016, 09/18/2017, 08/30/2018, 09/12/2019   Influenza,inj,Quad PF,6+ Mos 09/11/2014   Influenza-Unspecified 10/12/2018   PFIZER(Purple Top)SARS-COV-2 Vaccination 12/28/2019, 01/16/2020, 10/09/2020   Pneumococcal Conjugate-13 07/03/2014   Pneumococcal Polysaccharide-23 03/12/2004, 03/16/2009   Td 12/12/2004, 12/13/2007   Td (Adult), 2 Lf Tetanus Toxid, Preservative Free 12/12/2004, 12/13/2007   Tdap 07/16/2018   Zoster Recombinat (Shingrix) 06/08/2021   Zoster, Live 06/18/2012  There are no preventive care reminders to display for this patient.    Past Medical History:  Diagnosis Date   BENIGN PROSTATIC  HYPERTROPHY 03/10/2008   BENIGN PROSTATIC HYPERTROPHY, HX OF 03/10/2008   CHEST PAIN 03/10/2008   at pre-op 01-17-18: per patient had cardiac eval at that time was sent for test (uaware type) ; reports nothing was abnormal; no recurrence of chest pain    DEGENERATIVE JOINT DISEASE 03/10/2008   EAR PAIN, LEFT 12/09/2008   HYPERLIPIDEMIA 03/10/2008   Osteoarthritis    left ankle   PSA, INCREASED 03/16/2009   RASH-NONVESICULAR 03/16/2009   Past Surgical History:  Procedure Laterality Date   ACHILLES TENDON LENGTHENING Left 12/31/2020   Procedure: Percutaneous Achilles Tendon Lengthening;  Surgeon: Wylene Simmer, MD;  Location: [place];  Service: Orthopedics;  Laterality: Left;   back [place]   lower   EYE SURGERY Bilateral    lens replacement for cataracts   hip replacement left Left 2002   JOINT REPLACEMENT Right    knee 2019, left hip 2017   s/p left knee arthroscopy  yrs ago   TOTAL ANKLE ARTHROPLASTY Left 12/31/2020   Procedure: Left total ankle replacement;  Surgeon: Wylene Simmer, MD;  Location: [place];  Service: Orthopedics;  Laterality: Left;   TOTAL HIP ARTHROPLASTY Right 06/13/2016   Procedure: RIGHT TOTAL HIP ARTHROPLASTY ANTERIOR APPROACH;  Surgeon: Paralee Cancel, MD;  Location: [facility];  Service: Orthopedics;  Laterality: Right;  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   TOTAL KNEE ARTHROPLASTY Right 01/23/2018   Procedure: RIGHT TOTAL KNEE ARTHROPLASTY;  Surgeon: Paralee Cancel, MD;  Location:  [facility];  Service: Orthopedics;  Laterality: Right;  70 mins   transurethral resection of prostate      reports that he has never smoked. He has quit using smokeless tobacco. He reports that he does not drink alcohol and does not use drugs. family history includes Dementia in his mother. No Known Allergies No current outpatient medications on file prior to visit.   No current facility-administered medications on file prior to visit.        ROS:  All others reviewed and  negative.  Objective        PE:  BP 118/60 (BP Location: Right Arm, Patient Position: Sitting, Cuff Size: Large)   Pulse 72   Temp 98.2 F (36.8 C) (Oral)   Ht '5\' 9"'$  (1.753 m)   Wt 174 lb (78.9 kg)   SpO2 92%   BMI 25.70 kg/m                 Constitutional: Pt appears in NAD               HENT: Head: NCAT.                Right Ear: External ear normal.                 Left Ear: External ear normal.                Eyes: . Pupils are equal, round, and reactive to light. Conjunctivae and EOM are normal               Nose: without d/c or deformity               Neck: Neck supple. Gross normal ROM               Cardiovascular: Normal rate and regular rhythm.                 Pulmonary/Chest: Effort normal and breath sounds without rales or wheezing.                Abd:  Soft, NT, ND, + BS, no organomegaly               GU;  left testicle epidiymus 2+ swelling, tender               Neurological: Pt is alert. At baseline orientation, motor grossly intact               Skin: Skin is warm. No rashes, no other new lesions, LE edema - none               Psychiatric: Pt behavior is normal without agitation   Micro: none  Cardiac tracings I have personally interpreted today:  none  Pertinent Radiological findings (summarize): none   Lab Results  Component Value Date   WBC 5.3 08/16/2022   HGB 14.6 08/16/2022   HCT 43.5 08/16/2022   PLT 189.0 08/16/2022   GLUCOSE 90 08/16/2022   CHOL 159 08/16/2022   TRIG 94.0 08/16/2022   HDL 49.10 08/16/2022   LDLCALC 91 08/16/2022   ALT 24 08/16/2022   AST 32 08/16/2022   NA 141 08/16/2022   K 4.1 08/16/2022   CL 107 08/16/2022   CREATININE 0.81 08/16/2022   BUN 20 08/16/2022   CO2 26 08/16/2022   TSH 2.77 08/16/2022   PSA 2.07 08/11/2021   Assessment/Plan:  Alexander Castillo is a 82 y.o. White or Caucasian [1]  male with  has a past medical history of BENIGN PROSTATIC HYPERTROPHY (03/10/2008), BENIGN PROSTATIC HYPERTROPHY, HX OF  (03/10/2008), CHEST PAIN (03/10/2008), DEGENERATIVE JOINT DISEASE (03/10/2008), EAR PAIN, LEFT (12/09/2008), HYPERLIPIDEMIA (03/10/2008), Osteoarthritis, PSA, INCREASED (03/16/2009), and RASH-NONVESICULAR (03/16/2009).  Encounter for well adult exam with abnormal findings Age and sex appropriate education and counseling updated with regular exercise and diet Referrals for preventative services - none needed Immunizations addressed - declines covid booster Smoking counseling  - none needed Evidence for depression or other mood disorder - depression stable Most recent labs reviewed. I have personally reviewed and have noted: 1) the patient's medical and social history 2) The patient's current medications and supplements 3) The patient's height, weight, and BMI have been recorded in the chart   B12 deficiency Lab Results  Component Value Date   VITAMINB12 506 08/16/2022   Stable, cont oral replacement - b12 1000 mcg qd   Depression Chronic mild stable, declines SSRI  Hyperlipidemia Lab Results  Component Value Date   LDLCALC 91 08/16/2022   Stable, pt to continue current statin  - diet, low chol diet, excercise  Epididymoorchitis Improving but stll significant, for scrotum u/s  Followup: Return in about 7 months (around 08/21/2023).  Cathlean Cower, MD 01/08/2023 7:32 PM [facility]

## 2023-01-05 NOTE — Patient Instructions (Addendum)
Please finish your current antibiotic, and we have refilled this if you feel you need to continue after the first 7 days  Please continue all other medications as before, and refills have been done if requested.  Please have the pharmacy call with any other refills you may need.  Please continue your efforts at being more active, low cholesterol diet, and weight control.  You are otherwise up to date with prevention measures today.  Please keep your appointments with your specialists as you may have planned  You will be contacted regarding the referral for: Scrotum Ultrasound  We can hold on further lab testing for now  Please make an Appointment to return in Sept 9, or sooner if needed

## 2023-01-08 ENCOUNTER — Encounter: Payer: Self-pay | Admitting: Internal Medicine

## 2023-01-08 NOTE — Assessment & Plan Note (Signed)
Chronic mild stable, declines SSRI

## 2023-01-08 NOTE — Assessment & Plan Note (Signed)
Age and sex appropriate education and counseling updated with regular exercise and diet Referrals for preventative services - none needed Immunizations addressed - declines covid booster Smoking counseling  - none needed Evidence for depression or other mood disorder - depression stable Most recent labs reviewed. I have personally reviewed and have noted: 1) the patient's medical and social history 2) The patient's current medications and supplements 3) The patient's height, weight, and BMI have been recorded in the chart

## 2023-01-08 NOTE — Assessment & Plan Note (Signed)
Improving but stll significant, for scrotum u/s

## 2023-01-08 NOTE — Assessment & Plan Note (Signed)
Lab Results  Component Value Date   LDLCALC 91 08/16/2022   Stable, pt to continue current statin  - diet, low chol diet, excercise

## 2023-01-08 NOTE — Assessment & Plan Note (Signed)
Lab Results  Component Value Date   [id number] 910 08/16/2022   Stable, cont oral replacement - b12 1000 mcg qd

## 2023-01-16 ENCOUNTER — Other Ambulatory Visit: Payer: Self-pay | Admitting: Internal Medicine

## 2023-01-16 ENCOUNTER — Telehealth: Payer: Self-pay | Admitting: Internal Medicine

## 2023-01-16 DIAGNOSIS — N3001 Acute cystitis with hematuria: Secondary | ICD-10-CM

## 2023-01-16 DIAGNOSIS — N5089 Other specified disorders of the male genital organs: Secondary | ICD-10-CM

## 2023-01-16 NOTE — Telephone Encounter (Signed)
PT calls today following up on their last visit. PT was prescribed levofloxacin (LEVAQUIN) 500 MG tablet and is working on their last tablet today. PT's swelling has went down but still present. PT is wanting to know what next course of action should be?  CB: [phone]

## 2023-01-16 NOTE — Telephone Encounter (Signed)
If no worsening pain, fever, or swelling we can follow for now, and hopefully he will keep the appt for scrotum u/s on feb 14,   thanks

## 2023-01-16 NOTE — Telephone Encounter (Signed)
Patient completed Levofloxacin but still has some swelling and would like to know what to do next. Please advise if possible

## 2023-01-17 NOTE — Telephone Encounter (Signed)
Patient states that the swelling continues to go down and will inform us if he develops any pain or fever. No other concerns at this time

## 2023-01-25 ENCOUNTER — Ambulatory Visit
Admission: RE | Admit: 2023-01-25 | Discharge: 2023-01-25 | Disposition: A | Discharge status: Home / Self Care | Payer: Medicare Other | Source: Ambulatory Visit | Attending: Internal Medicine | Admitting: Internal Medicine

## 2023-01-25 DIAGNOSIS — R319 Hematuria, unspecified: Secondary | ICD-10-CM | POA: Diagnosis not present

## 2023-01-25 DIAGNOSIS — N453 Epididymo-orchitis: Secondary | ICD-10-CM

## 2023-01-26 ENCOUNTER — Other Ambulatory Visit: Payer: Self-pay | Admitting: Internal Medicine

## 2023-01-26 MED ORDER — CIPROFLOXACIN HCL 500 MG PO TABS: 500.0000 mg | ORAL_TABLET | Freq: Two times a day (BID) | ORAL | 0 refills | Status: AC

## 2023-02-21 DIAGNOSIS — H02831 Dermatochalasis of right upper eyelid: Secondary | ICD-10-CM | POA: Diagnosis not present

## 2023-02-21 DIAGNOSIS — H02834 Dermatochalasis of left upper eyelid: Secondary | ICD-10-CM | POA: Diagnosis not present

## 2023-02-21 DIAGNOSIS — H02413 Mechanical ptosis of bilateral eyelids: Secondary | ICD-10-CM | POA: Diagnosis not present

## 2023-02-21 DIAGNOSIS — H0279 Other degenerative disorders of eyelid and periocular area: Secondary | ICD-10-CM | POA: Diagnosis not present

## 2023-02-21 DIAGNOSIS — H57813 Brow ptosis, bilateral: Secondary | ICD-10-CM | POA: Diagnosis not present

## 2023-02-21 DIAGNOSIS — D485 Neoplasm of uncertain behavior of skin: Secondary | ICD-10-CM | POA: Diagnosis not present

## 2023-07-13 ENCOUNTER — Ambulatory Visit (INDEPENDENT_AMBULATORY_CARE_PROVIDER_SITE_OTHER): Payer: Medicare Other

## 2023-07-13 VITALS — Ht 69.0 in | Wt 175.0 lb

## 2023-07-13 DIAGNOSIS — Z0000 Encounter for general adult medical examination without abnormal findings: Secondary | ICD-10-CM | POA: Diagnosis not present

## 2023-07-13 NOTE — Patient Instructions (Addendum)
Mr. Alexander Castillo , Thank you for taking time to come for your Medicare Wellness Visit. I appreciate your ongoing commitment to your health goals. Please review the following plan we discussed and let me know if I can assist you in the future.   Referrals/Orders/Follow-Ups/Clinician Recommendations: Appointment with Dr. Jonny Ruiz on 08/21/2023 at 1:00 p.m.  This is a list of the screening recommended for you and due dates:  Health Maintenance  Topic Date Due   COVID-19 Vaccine (4 - 2023-24 season) 08/12/2022   Flu Shot  07/13/2023   Medicare Annual Wellness Visit  07/12/2024   DTaP/Tdap/Td vaccine (6 - Td or Tdap) 07/16/2028   Pneumonia Vaccine  Completed   HPV Vaccine  Aged Out   Zoster (Shingles) Vaccine  Discontinued    Advanced directives: (Copy Requested) Please bring a copy of your health care power of attorney and living will to the office to be added to your chart at your convenience.  Next Medicare Annual Wellness Visit scheduled for next year: Yes; 07/15/2024 at 1:30 p.m. via telephone visit with Nurse Percell Miller.  Preventive Care 25 Years and Older, Male  Preventive care refers to lifestyle choices and visits with your health care provider that can promote health and wellness. What does preventive care include? A yearly physical exam. This is also called an annual well check. Dental exams once or twice a year. Routine eye exams. Ask your health care provider how often you should have your eyes checked. Personal lifestyle choices, including: Daily care of your teeth and gums. Regular physical activity. Eating a healthy diet. Avoiding tobacco and drug use. Limiting alcohol use. Practicing safe sex. Taking low doses of aspirin every day. Taking vitamin and mineral supplements as recommended by your health care provider. What happens during an annual well check? The services and screenings done by your health care provider during your annual well check will depend on your age, overall  health, lifestyle risk factors, and family history of disease. Counseling  Your health care provider may ask you questions about your: Alcohol use. Tobacco use. Drug use. Emotional well-being. Home and relationship well-being. Sexual activity. Eating habits. History of falls. Memory and ability to understand (cognition). Work and work Astronomer. Screening  You may have the following tests or measurements: Height, weight, and BMI. Blood pressure. Lipid and cholesterol levels. These may be checked every 5 years, or more frequently if you are over 68 years old. Skin check. Lung cancer screening. You may have this screening every year starting at age 65 if you have a 30-pack-year history of smoking and currently smoke or have quit within the past 15 years. Fecal occult blood test (FOBT) of the stool. You may have this test every year starting at age 35. Flexible sigmoidoscopy or colonoscopy. You may have a sigmoidoscopy every 5 years or a colonoscopy every 10 years starting at age 64. Prostate cancer screening. Recommendations will vary depending on your family history and other risks. Hepatitis C blood test. Hepatitis B blood test. Sexually transmitted disease (STD) testing. Diabetes screening. This is done by checking your blood sugar (glucose) after you have not eaten for a while (fasting). You may have this done every 1-3 years. Abdominal aortic aneurysm (AAA) screening. You may need this if you are a current or former smoker. Osteoporosis. You may be screened starting at age 65 if you are at high risk. Talk with your health care provider about your test results, treatment options, and if necessary, the need for more tests. Vaccines  Your health care provider may recommend certain vaccines, such as: Influenza vaccine. This is recommended every year. Tetanus, diphtheria, and acellular pertussis (Tdap, Td) vaccine. You may need a Td booster every 10 years. Zoster vaccine. You may  need this after age 31. Pneumococcal 13-valent conjugate (PCV13) vaccine. One dose is recommended after age 85. Pneumococcal polysaccharide (PPSV23) vaccine. One dose is recommended after age 84. Talk to your health care provider about which screenings and vaccines you need and how often you need them. This information is not intended to replace advice given to you by your health care provider. Make sure you discuss any questions you have with your health care provider. Document Released: 12/25/2015 Document Revised: 08/17/2016 Document Reviewed: 09/29/2015 Elsevier Interactive Patient Education  2017 [employer].  Fall Prevention in the Home Falls can cause injuries. They can happen to people of all ages. There are many things you can do to make your home safe and to help prevent falls. What can I do on the outside of my home? Regularly fix the edges of walkways and driveways and fix any cracks. Remove anything that might make you trip as you walk through a door, such as a raised step or threshold. Trim any bushes or trees on the path to your home. Use bright outdoor lighting. Clear any walking paths of anything that might make someone trip, such as rocks or tools. Regularly check to see if handrails are loose or broken. Make sure that both sides of any steps have handrails. Any raised decks and porches should have guardrails on the edges. Have any leaves, snow, or ice cleared regularly. Use sand or salt on walking paths during winter. Clean up any spills in your garage right away. This includes oil or grease spills. What can I do in the bathroom? Use night lights. Install grab bars by the toilet and in the tub and shower. Do not use towel bars as grab bars. Use non-skid mats or decals in the tub or shower. If you need to sit down in the shower, use a plastic, non-slip stool. Keep the floor dry. Clean up any water that spills on the floor as soon as it happens. Remove soap buildup in  the tub or shower regularly. Attach bath mats securely with double-sided non-slip rug tape. Do not have throw rugs and other things on the floor that can make you trip. What can I do in the bedroom? Use night lights. Make sure that you have a light by your bed that is easy to reach. Do not use any sheets or blankets that are too big for your bed. They should not hang down onto the floor. Have a firm chair that has side arms. You can use this for support while you get dressed. Do not have throw rugs and other things on the floor that can make you trip. What can I do in the kitchen? Clean up any spills right away. Avoid walking on wet floors. Keep items that you use a lot in easy-to-reach places. If you need to reach something above you, use a strong step stool that has a grab bar. Keep electrical cords out of the way. Do not use floor polish or wax that makes floors slippery. If you must use wax, use non-skid floor wax. Do not have throw rugs and other things on the floor that can make you trip. What can I do with my stairs? Do not leave any items on the stairs. Make sure that there are  handrails on both sides of the stairs and use them. Fix handrails that are broken or loose. Make sure that handrails are as long as the stairways. Check any carpeting to make sure that it is firmly attached to the stairs. Fix any carpet that is loose or worn. Avoid having throw rugs at the top or bottom of the stairs. If you do have throw rugs, attach them to the floor with carpet tape. Make sure that you have a light switch at the top of the stairs and the bottom of the stairs. If you do not have them, ask someone to add them for you. What else can I do to help prevent falls? Wear shoes that: Do not have high heels. Have rubber bottoms. Are comfortable and fit you well. Are closed at the toe. Do not wear sandals. If you use a stepladder: Make sure that it is fully opened. Do not climb a closed  stepladder. Make sure that both sides of the stepladder are locked into place. Ask someone to hold it for you, if possible. Clearly mark and make sure that you can see: Any grab bars or handrails. First and last steps. Where the edge of each step is. Use tools that help you move around (mobility aids) if they are needed. These include: Canes. Walkers. Scooters. Crutches. Turn on the lights when you go into a dark area. Replace any light bulbs as soon as they burn out. Set up your furniture so you have a clear path. Avoid moving your furniture around. If any of your floors are uneven, fix them. If there are any pets around you, be aware of where they are. Review your medicines with your doctor. Some medicines can make you feel dizzy. This can increase your chance of falling. Ask your doctor what other things that you can do to help prevent falls. This information is not intended to replace advice given to you by your health care provider. Make sure you discuss any questions you have with your health care provider. Document Released: 09/24/2009 Document Revised: 05/05/2016 Document Reviewed: 01/02/2015 Elsevier Interactive Patient Education  2017 [employer].

## 2023-07-13 NOTE — Progress Notes (Signed)
Subjective:   Alexander Castillo is a 82 y.o. male who presents for Medicare Annual/Subsequent preventive examination.  Visit Complete: Virtual  I connected with  Alexander Castillo on 07/13/23 by a audio enabled telemedicine application and verified that I am speaking with the correct person using two identifiers.  Patient Location: Home  Provider Location: Office/Clinic  I discussed the limitations of evaluation and management by telemedicine. The patient expressed understanding and agreed to proceed.  Vital Signs: Patient was unable to self-report vital signs via telehealth due to a lack of equipment at home.   Review of Systems     Cardiac Risk Factors include: advanced age (>50men, >70 women);male gender     Objective:    Today's Vitals   07/13/23 1304  Weight: 175 lb (79.4 kg)  Height: 5\' 9"  (1.753 m)  PainSc: 0-No pain   Body mass index is 25.84 kg/m.     07/13/2023    1:06 PM 07/11/2022    1:40 PM 06/11/2021   11:21 AM 12/31/2020    6:43 AM 12/25/2020   11:51 AM 06/10/2020    2:18 PM 01/11/2019    1:10 PM  Advanced Directives  Does Patient Have a Medical Advance Directive? Yes Yes Yes Yes Yes Yes Yes  Type of Estate agent of [place];Living will Healthcare Power of [place];Living will Healthcare Power of [place];Living will Healthcare Power of [place];Living will Healthcare Power of [place];Living will Healthcare Power of [place];Living will Healthcare Power of [place];Living will  Does patient want to make changes to medical advance directive?   No - Patient declined No - Patient declined No - Patient declined No - Patient declined   Copy of Healthcare Power of Attorney in Chart? No - copy requested No - copy requested No - copy requested No - copy requested No - copy requested No - copy requested No - copy requested    Current Medications (verified) Outpatient Encounter Medications as of 07/13/2023  Medication Sig   [DISCONTINUED]  levofloxacin (LEVAQUIN) 500 MG tablet Take 1 tablet (500 mg total) by mouth daily. (Patient not taking: Reported on 07/13/2023)   No facility-administered encounter medications on file as of 07/13/2023.    Allergies (verified) Patient has no known allergies.   History: Past Medical History:  Diagnosis Date   BENIGN PROSTATIC HYPERTROPHY 03/10/2008   BENIGN PROSTATIC HYPERTROPHY, HX OF 03/10/2008   CHEST PAIN 03/10/2008   at pre-op 01-17-18: per patient had cardiac eval at that time was sent for test (uaware type) ; reports nothing was abnormal; no recurrence of chest pain    DEGENERATIVE JOINT DISEASE 03/10/2008   EAR PAIN, LEFT 12/09/2008   HYPERLIPIDEMIA 03/10/2008   Osteoarthritis    left ankle   PSA, INCREASED 03/16/2009   RASH-NONVESICULAR 03/16/2009   Past Surgical History:  Procedure Laterality Date   ACHILLES TENDON LENGTHENING Left 12/31/2020   Procedure: Percutaneous Achilles Tendon Lengthening;  Surgeon: Toni Arthurs, MD;  Location: [facility];  Service: Orthopedics;  Laterality: Left;   back surgury  1998   lower   EYE SURGERY Bilateral    lens replacement for cataracts   hip replacement left Left 2002   JOINT REPLACEMENT Right    knee 2019, left hip 2017   s/p left knee arthroscopy  yrs ago   TOTAL ANKLE ARTHROPLASTY Left 12/31/2020   Procedure: Left total ankle replacement;  Surgeon: Toni Arthurs, MD;  Location: [facility];  Service: Orthopedics;  Laterality: Left;   TOTAL HIP ARTHROPLASTY  Right 06/13/2016   Procedure: RIGHT TOTAL HIP ARTHROPLASTY ANTERIOR APPROACH;  Surgeon: Durene Romans, MD;  Location: [facility];  Service: Orthopedics;  Laterality: Right;  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   TOTAL KNEE ARTHROPLASTY Right 01/23/2018   Procedure: RIGHT TOTAL KNEE ARTHROPLASTY;  Surgeon: Durene Romans, MD;  Location: [facility];  Service: Orthopedics;  Laterality: Right;  70 mins   transurethral resection of prostate     Family History  Problem Relation Age of  Onset   Dementia Mother    Social History   Socioeconomic History   Marital status: Widowed    Spouse name: Not on file   Number of children: 2   Years of education: Not on file   Highest education level: Not on file  Occupational History   Occupation: retired Surveyor, minerals   Tobacco Use   Smoking status: Never   Smokeless tobacco: Former  Building services engineer status: Never Used  Substance and Sexual Activity   Alcohol use: No   Drug use: No   Sexual activity: Not Currently  Other Topics Concern   Not on file  Social History Narrative   Not on file   Social Determinants of Health   Financial Resource Strain: Low Risk  (07/13/2023)   Overall Financial Resource Strain (CARDIA)    Difficulty of Paying Living Expenses: Not hard at all  Food Insecurity: No Food Insecurity (07/13/2023)   Hunger Vital Sign    Worried About Running Out of Food in the Last Year: Never true    Ran Out of Food in the Last Year: Never true  Transportation Needs: No Transportation Needs (07/13/2023)   PRAPARE - Administrator, Civil Service (Medical): No    Lack of Transportation (Non-Medical): No  Physical Activity: Sufficiently Active (07/13/2023)   Exercise Vital Sign    Days of Exercise per Week: 5 days    Minutes of Exercise per Session: 120 min  Stress: No Stress Concern Present (07/13/2023)   [employer] of Occupational Health - Occupational Stress Questionnaire    Feeling of Stress : Not at all  Social Connections: Socially Isolated (07/13/2023)   Social Connection and Isolation Panel [NHANES]    Frequency of Communication with Friends and Family: Three times a week    Frequency of Social Gatherings with Friends and Family: Three times a week    Attends Religious Services: Never    Active Member of Clubs or Organizations: No    Attends Banker Meetings: Never    Marital Status: Widowed    Tobacco Counseling Counseling given: Not Answered   Clinical  Intake:  Pre-visit preparation completed: Yes  Pain : No/denies pain Pain Score: 0-No pain     BMI - recorded: 25.84 Nutritional Status: BMI 25 -29 Overweight Nutritional Risks: None Diabetes: No  How often do you need to have someone help you when you read instructions, pamphlets, or other written materials from your doctor or pharmacy?: 1 - Never What is the last grade level you completed in school?: HSG  Interpreter Needed?: No  Information entered by :: Bevan Disney N. Jhania Etherington, LPN.   Activities of Daily Living    07/13/2023    1:08 PM  In your present state of health, do you have any difficulty performing the following activities:  Hearing? 0  Vision? 0  Difficulty concentrating or making decisions? 0  Walking or climbing stairs? 0  Dressing or bathing? 0  Doing errands, shopping? 0  Preparing Food and eating ?  N  Using the Toilet? N  In the past six months, have you accidently leaked urine? Y  Do you have problems with loss of bowel control? N  Managing your Medications? N  Managing your Finances? N  Housekeeping or managing your Housekeeping? N    Patient Care Team: Corwin Levins, MD as PCP - General Jimmey Ralph Forest Becker, DO as Consulting Physician (Osteopathic Medicine)  Indicate any recent Medical Services you may have received from other than [facility] providers in the past year (date may be approximate).     Assessment:   This is a routine wellness examination for [place].  Hearing/Vision screen Hearing Screening - Comments:: Patient denied any hearing difficulty.   No hearing aids.  Vision Screening - Comments:: Patient does not wear any corrective lenses/contacts.   [facility] exam done by: [facility]   Dietary issues and exercise activities discussed:     Goals Addressed             This Visit's Progress    My healthcare goal is to keep surviving.        Depression Screen    07/13/2023    1:10 PM 01/05/2023    2:34 PM 01/05/2023    2:10 PM  08/18/2022    1:23 PM 07/11/2022    1:40 PM 08/17/2021    1:23 PM 08/17/2021    1:05 PM  PHQ 2/9 Scores  PHQ - 2 Score 0 1  0 0 0 0  PHQ- 9 Score 0        Exception Documentation   Patient refusal        Fall Risk    07/13/2023    1:08 PM 01/05/2023    2:34 PM 01/05/2023    2:10 PM 07/11/2022    1:43 PM 07/11/2022    1:40 PM  Fall Risk   Falls in the past year? 0 0 0 0 0  Number falls in past yr: 0 0  0 0  Injury with Fall? 0 0 0 0 0  Risk for fall due to : No Fall Risks  No Fall Risks    Follow up Falls prevention discussed  Falls evaluation completed Falls evaluation completed;Education provided Falls evaluation completed;Education provided    MEDICARE RISK AT HOME:  Medicare Risk at Home - 07/13/23 1307     Any stairs in or around the home? No    If so, are there any without handrails? No    Home free of loose throw rugs in walkways, pet beds, electrical cords, etc? Yes    Adequate lighting in your home to reduce risk of falls? Yes    Life alert? No    Use of a cane, walker or w/c? No    Grab bars in the bathroom? Yes    Shower chair or bench in shower? Yes    Elevated toilet seat or a handicapped toilet? Yes             TIMED UP AND GO:  Was the test performed?  No    Cognitive Function:    01/11/2019    1:41 PM  MMSE - Mini Mental State Exam  Orientation to time 5  Orientation to Place 5  Registration 3  Attention/ Calculation 5  Recall 1  Language- name 2 objects 2  Language- repeat 1  Language- follow 3 step command 3  Language- read & follow direction 1  Write a sentence 1  Copy design 1  Total score  28        07/13/2023    1:09 PM 07/11/2022    1:43 PM  6CIT Screen  What Year? 0 points 0 points  What month? 0 points 0 points  What time? 0 points 0 points  Count back from 20 0 points 0 points  Months in reverse 0 points 0 points  Repeat phrase 0 points 0 points  Total Score 0 points 0 points    Immunizations Immunization History  Administered  Date(s) Administered   Fluad Quad(high Dose 65+) 08/18/2022   Influenza Whole 09/11/2008, 09/11/2009   Influenza, High Dose Seasonal PF 08/26/2014, 09/08/2015, 08/23/2016, 09/18/2017, 08/30/2018, 09/12/2019   Influenza,inj,Quad PF,6+ Mos 09/11/2014   Influenza-Unspecified 10/12/2018   PFIZER(Purple Top)SARS-COV-2 Vaccination 12/28/2019, 01/16/2020, 10/09/2020   Pneumococcal Conjugate-13 07/03/2014   Pneumococcal Polysaccharide-23 03/12/2004, 03/16/2009   Td 12/12/2004, 12/13/2007   Td (Adult), 2 Lf Tetanus Toxid, Preservative Free 12/12/2004, 12/13/2007   Tdap 07/16/2018   Zoster Recombinant(Shingrix) 06/08/2021   Zoster, Live 06/18/2012    TDAP status: Up to date  Flu Vaccine status: Up to date  Pneumococcal vaccine status: Up to date  Covid-19 vaccine status: Completed vaccines  Qualifies for Shingles Vaccine? Yes   Zostavax completed Yes   Shingrix Completed?: No.    Education has been provided regarding the importance of this vaccine. Patient has been advised to call insurance company to determine out of pocket expense if they have not yet received this vaccine. Advised may also receive vaccine at local pharmacy or Health Dept. Verbalized acceptance and understanding.  Screening Tests Health Maintenance  Topic Date Due   COVID-19 Vaccine (4 - 2023-24 season) 08/12/2022   INFLUENZA VACCINE  07/13/2023   Medicare Annual Wellness (AWV)  07/12/2024   DTaP/Tdap/Td (6 - Td or Tdap) 07/16/2028   Pneumonia Vaccine 85+ Years old  Completed   HPV VACCINES  Aged Out   Zoster Vaccines- Shingrix  Discontinued    Health Maintenance  Health Maintenance Due  Topic Date Due   COVID-19 Vaccine (4 - 2023-24 season) 08/12/2022   INFLUENZA VACCINE  07/13/2023    Colorectal cancer screening: No longer required.   Lung Cancer Screening: (Low Dose CT Chest recommended if Age 12-80 years, 20 pack-year currently smoking OR have quit w/in 15years.) does not qualify.   Lung Cancer  Screening Referral: no  Additional Screening:  Hepatitis C Screening: does not qualify; Completed: no  Vision Screening: Recommended annual ophthalmology exams for early detection of glaucoma and other disorders of the eye. Is the patient up to date with their annual eye exam?  Yes  Who is the provider or what is the name of the office in which the patient attends annual eye exams? Blair Hailey, DO. If pt is not established with a provider, would they like to be referred to a provider to establish care? No .   Dental Screening: Recommended annual dental exams for proper oral hygiene  Diabetic Foot Exam: N/A  Community Resource Referral / Chronic Care Management: CRR required this visit?  No   CCM required this visit?  No     Plan:     I have personally reviewed and noted the following in the patient's chart:   Medical and social history Use of alcohol, tobacco or illicit drugs  Current medications and supplements including opioid prescriptions. Patient is not currently taking opioid prescriptions. Functional ability and status Nutritional status Physical activity Advanced directives List of other physicians Hospitalizations, surgeries, and ER visits in previous 12  months Vitals Screenings to include cognitive, depression, and falls Referrals and appointments  In addition, I have reviewed and discussed with patient certain preventive protocols, quality metrics, and best practice recommendations. A written personalized care plan for preventive services as well as general preventive health recommendations were provided to patient.     Mickeal Needy, LPN   4/0/9811   After Visit Summary: (Mail) Due to this being a telephonic visit, the after visit summary with patients personalized plan was offered to patient via mail   Nurse Notes: Normal cognitive status assessed by direct observation via telephone conversation by this Nurse Health Advisor. No abnormalities  found.

## 2023-07-27 ENCOUNTER — Encounter (INDEPENDENT_AMBULATORY_CARE_PROVIDER_SITE_OTHER): Payer: Self-pay

## 2023-08-18 ENCOUNTER — Other Ambulatory Visit (INDEPENDENT_AMBULATORY_CARE_PROVIDER_SITE_OTHER): Payer: Medicare Other

## 2023-08-18 DIAGNOSIS — E559 Vitamin D deficiency, unspecified: Secondary | ICD-10-CM

## 2023-08-18 DIAGNOSIS — E538 Deficiency of other specified B group vitamins: Secondary | ICD-10-CM

## 2023-08-18 DIAGNOSIS — E7800 Pure hypercholesterolemia, unspecified: Secondary | ICD-10-CM

## 2023-08-18 LAB — CBC WITH DIFFERENTIAL/PLATELET
Basophils Absolute: 0 10*3/uL (ref 0.0–0.1)
Basophils Relative: 0.8 % (ref 0.0–3.0)
Eosinophils Absolute: 0 10*3/uL (ref 0.0–0.7)
Eosinophils Relative: 0.8 % (ref 0.0–5.0)
HCT: 43.1 % (ref 39.0–52.0)
Hemoglobin: 14.1 g/dL (ref 13.0–17.0)
Lymphocytes Relative: 25.8 % (ref 12.0–46.0)
Lymphs Abs: 1.4 10*3/uL (ref 0.7–4.0)
MCHC: 32.6 g/dL (ref 30.0–36.0)
MCV: 92.2 fl (ref 78.0–100.0)
Monocytes Absolute: 0.5 10*3/uL (ref 0.1–1.0)
Monocytes Relative: 9.1 % (ref 3.0–12.0)
Neutro Abs: 3.4 10*3/uL (ref 1.4–7.7)
Neutrophils Relative %: 63.5 % (ref 43.0–77.0)
Platelets: 222 10*3/uL (ref 150.0–400.0)
RBC: 4.68 Mil/uL (ref 4.22–5.81)
RDW: 13.8 % (ref 11.5–15.5)
WBC: 5.4 10*3/uL (ref 4.0–10.5)

## 2023-08-18 LAB — LIPID PANEL
Cholesterol: 157 mg/dL (ref 0–200)
HDL: 49.9 mg/dL (ref >39.00)
LDL Cholesterol: 94 mg/dL (ref 0–99)
NonHDL: 107.54
Total CHOL/HDL Ratio: 3
Triglycerides: 67 mg/dL (ref 0.0–149.0)
VLDL: 13.4 mg/dL (ref 0.0–40.0)

## 2023-08-18 LAB — URINALYSIS, ROUTINE W REFLEX MICROSCOPIC
Bilirubin Urine: NEGATIVE
Hgb urine dipstick: NEGATIVE
Ketones, ur: NEGATIVE
Leukocytes,Ua: NEGATIVE
Nitrite: NEGATIVE
Specific Gravity, Urine: >=1.03 — AB (ref 1.000–1.030)
Total Protein, Urine: NEGATIVE
Urine Glucose: NEGATIVE
Urobilinogen, UA: 0.2 (ref 0.0–1.0)
pH: 6 (ref 5.0–8.0)

## 2023-08-18 LAB — HEPATIC FUNCTION PANEL
ALT: 23 U/L (ref 0–53)
AST: 31 U/L (ref 0–37)
Albumin: 4.1 g/dL (ref 3.5–5.2)
Alkaline Phosphatase: 48 U/L (ref 39–117)
Bilirubin, Direct: 0.1 mg/dL (ref 0.0–0.3)
Total Bilirubin: 0.6 mg/dL (ref 0.2–1.2)
Total Protein: 6.5 g/dL (ref 6.0–8.3)

## 2023-08-18 LAB — VITAMIN B12: Vitamin B-12: 737 pg/mL (ref 211–911)

## 2023-08-18 LAB — BASIC METABOLIC PANEL
BUN: 18 mg/dL (ref 6–23)
CO2: 27 meq/L (ref 19–32)
Calcium: 9.4 mg/dL (ref 8.4–10.5)
Chloride: 108 meq/L (ref 96–112)
Creatinine, Ser: 0.83 mg/dL (ref 0.40–1.50)
GFR: 80.33 mL/min (ref >60.00)
Glucose, Bld: 91 mg/dL (ref 70–99)
Potassium: 3.8 meq/L (ref 3.5–5.1)
Sodium: 143 meq/L (ref 135–145)

## 2023-08-18 LAB — VITAMIN D 25 HYDROXY (VIT D DEFICIENCY, FRACTURES): VITD: 49.91 ng/mL (ref 30.00–100.00)

## 2023-08-18 LAB — TSH: TSH: 2.36 u[IU]/mL (ref 0.35–5.50)

## 2023-08-21 ENCOUNTER — Encounter: Payer: Self-pay | Admitting: Internal Medicine

## 2023-08-21 ENCOUNTER — Ambulatory Visit (INDEPENDENT_AMBULATORY_CARE_PROVIDER_SITE_OTHER): Payer: Medicare Other | Admitting: Internal Medicine

## 2023-08-21 VITALS — BP 110/68 | HR 65 | Temp 98.1°F | Ht 69.0 in | Wt 170.0 lb

## 2023-08-21 DIAGNOSIS — Z23 Encounter for immunization: Secondary | ICD-10-CM | POA: Diagnosis not present

## 2023-08-21 DIAGNOSIS — J029 Acute pharyngitis, unspecified: Secondary | ICD-10-CM | POA: Diagnosis not present

## 2023-08-21 DIAGNOSIS — B351 Tinea unguium: Secondary | ICD-10-CM | POA: Diagnosis not present

## 2023-08-21 DIAGNOSIS — Z0001 Encounter for general adult medical examination with abnormal findings: Secondary | ICD-10-CM

## 2023-08-21 DIAGNOSIS — E538 Deficiency of other specified B group vitamins: Secondary | ICD-10-CM | POA: Diagnosis not present

## 2023-08-21 DIAGNOSIS — Z0000 Encounter for general adult medical examination without abnormal findings: Secondary | ICD-10-CM | POA: Diagnosis not present

## 2023-08-21 DIAGNOSIS — E7800 Pure hypercholesterolemia, unspecified: Secondary | ICD-10-CM | POA: Diagnosis not present

## 2023-08-21 DIAGNOSIS — J309 Allergic rhinitis, unspecified: Secondary | ICD-10-CM | POA: Diagnosis not present

## 2023-08-21 MED ORDER — CICLOPIROX 8 % EX SOLN: Freq: Every day | CUTANEOUS | 11 refills | Status: DC

## 2023-08-21 NOTE — Assessment & Plan Note (Signed)
Lab Results  Component Value Date   VITAMINB12 737 08/18/2023   Stable, cont oral replacement - b12 1000 mcg qd

## 2023-08-21 NOTE — Progress Notes (Signed)
Patient ID: Alexander Castillo, male   DOB: 1940-12-15, 82 y.o.   MRN: [id number]         Chief Complaint:: wellness exam and sore throat, allergies, left hand and right treat toe onychomycosis, chronic diarrhea, hld, low b12       HPI:  Alexander Castillo is a 82 y.o. male here for wellness exam, declines covid booster, o/w up to date                        Also has chronic ST with a tickling sensation often. Has intermittent diarrhea but Denies worsening reflux, abd pain, dysphagia, n/v, other bowel change or blood.  Also with worsening urinary leakage with stains on underwear in the am.  Pt denies chest pain, increased sob or doe, wheezing, orthopnea, PND, increased LE swelling, palpitations, dizziness or syncope.   Pt denies polydipsia, polyuria, or new focal neuro s/s.    Pt denies fever, wt loss, night sweats, loss of appetite, or other constitutional symptoms   Wt overall stable usually in this range.  Does have several wks ongoing nasal allergy symptoms with clearish congestion, itch and sneezing, without fever, pain, cough, swelling or wheezing. Denies worsening reflux, abd pain, dysphagia, n/v, or blood, but has chronic diarrhea mild intermittent.  Has left hand and right great toe onychomycosis worsening, asks for tx.. Wt Readings from Last 3 Encounters:  08/21/23 170 lb (77.1 kg)  07/13/23 175 lb (79.4 kg)  01/05/23 174 lb (78.9 kg)   BP Readings from Last 3 Encounters:  08/21/23 110/68  01/05/23 118/60  01/03/23 (!) 140/69   Immunization History  Administered Date(s) Administered   Fluad Quad(high Dose 65+) 08/18/2022   Fluad Trivalent(High Dose 65+) 08/21/2023   Influenza Whole 09/11/2008, 09/11/2009   Influenza, High Dose Seasonal PF 08/26/2014, 09/08/2015, 08/23/2016, 09/18/2017, 08/30/2018, 09/12/2019, 08/13/2020   Influenza,inj,Quad PF,6+ Mos 09/11/2014   Influenza-Unspecified 10/12/2018   PFIZER(Purple Top)SARS-COV-2 Vaccination 12/28/2019, 01/16/2020, 10/09/2020    Pneumococcal Conjugate-13 07/03/2014   Pneumococcal Polysaccharide-23 03/12/2004, 03/16/2009   Td 12/12/2004, 12/13/2007   Td (Adult), 2 Lf Tetanus Toxid, Preservative Free 12/12/2004, 12/13/2007   Tdap 07/16/2018   Zoster Recombinant(Shingrix) 06/08/2021   Zoster, Live 06/18/2012   There are no preventive care reminders to display for this patient.     Past Medical History:  Diagnosis Date   BENIGN PROSTATIC HYPERTROPHY 03/10/2008   BENIGN PROSTATIC HYPERTROPHY, HX OF 03/10/2008   CHEST PAIN 03/10/2008   at pre-op 01-17-18: per patient had cardiac eval at that time was sent for test (uaware type) ; reports nothing was abnormal; no recurrence of chest pain    DEGENERATIVE JOINT DISEASE 03/10/2008   EAR PAIN, LEFT 12/09/2008   HYPERLIPIDEMIA 03/10/2008   Osteoarthritis    left ankle   PSA, INCREASED 03/16/2009   RASH-NONVESICULAR 03/16/2009   Past Surgical History:  Procedure Laterality Date   ACHILLES TENDON LENGTHENING Left 12/31/2020   Procedure: Percutaneous Achilles Tendon Lengthening;  Surgeon: Toni Arthurs, MD;  Location: [facility];  Service: Orthopedics;  Laterality: Left;   back surgury  1998   lower   EYE SURGERY Bilateral    lens replacement for cataracts   hip replacement left Left 2002   JOINT REPLACEMENT Right    knee 2019, left hip 2017   s/p left knee arthroscopy  yrs ago   TOTAL ANKLE ARTHROPLASTY Left 12/31/2020   Procedure: Left total ankle replacement;  Surgeon: Toni Arthurs, MD;  Location: [facility];  Service: Orthopedics;  Laterality: Left;   TOTAL HIP ARTHROPLASTY Right 06/13/2016   Procedure: RIGHT TOTAL HIP ARTHROPLASTY ANTERIOR APPROACH;  Surgeon: Durene Romans, MD;  Location: [facility];  Service: Orthopedics;  Laterality: Right;  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   TOTAL KNEE ARTHROPLASTY Right 01/23/2018   Procedure: RIGHT TOTAL KNEE ARTHROPLASTY;  Surgeon: Durene Romans, MD;  Location: [facility];  Service: Orthopedics;  Laterality: Right;  70  mins   transurethral resection of prostate      reports that he has never smoked. He has quit using smokeless tobacco. He reports that he does not drink alcohol and does not use drugs. family history includes Dementia in his mother. No Known Allergies No current outpatient medications on file prior to visit.   No current facility-administered medications on file prior to visit.        ROS:  All others reviewed and negative.  Objective        PE:  BP 110/68 (BP Location: Right Arm, Patient Position: Sitting, Cuff Size: Normal)   Pulse 65   Temp 98.1 F (36.7 C) (Oral)   Ht 5\' 9"  (1.753 m)   Wt 170 lb (77.1 kg)   SpO2 96%   BMI 25.10 kg/m                 Constitutional: Pt appears in NAD               HENT: Head: NCAT.                Right Ear: External ear normal.                 Left Ear: External ear normal.                Eyes: . Pupils are equal, round, and reactive to light. Conjunctivae and EOM are normal               Nose: without d/c or deformity               Neck: Neck supple. Gross normal ROM               Cardiovascular: Normal rate and regular rhythm.                 Pulmonary/Chest: Effort normal and breath sounds without rales or wheezing.                Abd:  Soft, NT, ND, + BS, no organomegaly               Neurological: Pt is alert. At baseline orientation, motor grossly intact               Skin: Skin is warm. No rashes, no other new lesions, LE edema - none, has all fingernail left hand and right great toe onychomycotic changes               Psychiatric: Pt behavior is normal without agitation   Micro: none  Cardiac tracings I have personally interpreted today:  none  Pertinent Radiological findings (summarize): none   Lab Results  Component Value Date   WBC 5.4 08/18/2023   HGB 14.1 08/18/2023   HCT 43.1 08/18/2023   PLT 222.0 08/18/2023   GLUCOSE 91 08/18/2023   CHOL 157 08/18/2023   TRIG 67.0 08/18/2023   HDL 49.90 08/18/2023   LDLCALC 94  08/18/2023   ALT 23 08/18/2023   AST 31 08/18/2023  NA 143 08/18/2023   K 3.8 08/18/2023   CL 108 08/18/2023   CREATININE 0.83 08/18/2023   BUN 18 08/18/2023   CO2 27 08/18/2023   TSH 2.36 08/18/2023   PSA 2.07 08/11/2021   Assessment/Plan:  Alexander Castillo Hague is a 82 y.o. White or Caucasian [1] male with  has a past medical history of BENIGN PROSTATIC HYPERTROPHY (03/10/2008), BENIGN PROSTATIC HYPERTROPHY, HX OF (03/10/2008), CHEST PAIN (03/10/2008), DEGENERATIVE JOINT DISEASE (03/10/2008), EAR PAIN, LEFT (12/09/2008), HYPERLIPIDEMIA (03/10/2008), Osteoarthritis, PSA, INCREASED (03/16/2009), and RASH-NONVESICULAR (03/16/2009).  Encounter for well adult exam with abnormal findings Age and sex appropriate education and counseling updated with regular exercise and diet Referrals for preventative services - none needed Immunizations addressed - declines covid booster Smoking counseling  - none needed Evidence for depression or other mood disorder - none significant Most recent labs reviewed. I have personally reviewed and have noted: 1) the patient's medical and social history 2) The patient's current medications and supplements 3) The patient's height, weight, and BMI have been recorded in the chart   Sore throat Chronic persistent, etiology unclear, for ENT referral as per pt request  Hyperlipidemia Lab Results  Component Value Date   LDLCALC 94 08/18/2023   Stable, pt to continue current statin  - diet, wt control  B12 deficiency Lab Results  Component Value Date   VITAMINB12 737 08/18/2023   Stable, cont oral replacement - b12 1000 mcg qd   Onychomycosis Mild to mod, for penlac asd,  to f/u any worsening symptoms or concerns  Allergic rhinitis Mild to mod, for allegra prn asd,  to f/u any worsening symptoms or concerns  Followup: Return in about 6 months (around 02/18/2024).  Oliver Barre, MD 08/21/2023 8:50 PM [facility]

## 2023-08-21 NOTE — Assessment & Plan Note (Signed)
Chronic persistent, etiology unclear, for ENT referral as per pt request

## 2023-08-21 NOTE — Assessment & Plan Note (Signed)

## 2023-08-21 NOTE — Assessment & Plan Note (Signed)
Mild to mod, for penlac asd,  to f/u any worsening symptoms or concerns

## 2023-08-21 NOTE — Assessment & Plan Note (Signed)
Lab Results  Component Value Date   LDLCALC 94 08/18/2023   Stable, pt to continue current statin  - diet, wt control

## 2023-08-21 NOTE — Patient Instructions (Addendum)
You had the flu shot today  Ok to take the OTC Allegra for allergies, and Immodium as needed for diarrhea  Please take all new medication as prescribed - the Penlac for the nails  Please continue all other medications as before, and refills have been done if requested.  Please have the pharmacy call with any other refills you may need.  Please continue your efforts at being more active, low cholesterol diet, and weight control.  You are otherwise up to date with prevention measures today.  Please keep your appointments with your specialists as you may have planned  You will be contacted regarding the referral for: ENT  Please make an Appointment to return in 6 months, or sooner if needed, due to your age and need for further monitoring

## 2023-08-21 NOTE — Assessment & Plan Note (Signed)
Mild to mod, for allegra prn asd,  to f/u any worsening symptoms or concerns

## 2023-09-22 ENCOUNTER — Ambulatory Visit: Payer: Medicare Other | Admitting: Podiatry

## 2023-09-22 DIAGNOSIS — L84 Corns and callosities: Secondary | ICD-10-CM | POA: Diagnosis not present

## 2023-09-22 NOTE — Progress Notes (Signed)
  Subjective:  Patient ID: Alexander Castillo, male    DOB: 1941/03/20,  MRN: [id number]  Chief Complaint  Patient presents with   Callouses    RIGHT FOOT 2ND TOE AND IN BETWEEN HALLUX AND 2ND TOE    82 y.o. male presents with the above complaint.  Patient presents for right heloma molle between the big toe and the second toe.  He states has been present for quite some time hurts with ambulation worse with pressure and wanted discuss treatment options for it.  He wears some kind of toe separator which has not helped helps him a little bit pain scale is 5 out of 10 dull aching nature   Review of Systems: Negative except as noted in the HPI. Denies N/V/F/Ch.  Past Medical History:  Diagnosis Date   BENIGN PROSTATIC HYPERTROPHY 03/10/2008   BENIGN PROSTATIC HYPERTROPHY, HX OF 03/10/2008   CHEST PAIN 03/10/2008   at pre-op 01-17-18: per patient had cardiac eval at that time was sent for test (uaware type) ; reports nothing was abnormal; no recurrence of chest pain    DEGENERATIVE JOINT DISEASE 03/10/2008   EAR PAIN, LEFT 12/09/2008   HYPERLIPIDEMIA 03/10/2008   Osteoarthritis    left ankle   PSA, INCREASED 03/16/2009   RASH-NONVESICULAR 03/16/2009    Current Outpatient Medications:    ciclopirox (PENLAC) 8 % solution, Apply topically at bedtime. Apply over nail and surrounding skin. Apply daily over previous coat. After seven (7) days, may remove with alcohol and continue cycle., Disp: 6.6 mL, Rfl: 11  Social History   Tobacco Use  Smoking Status Never  Smokeless Tobacco Former    No Known Allergies Objective:  There were no vitals filed for this visit. There is no height or weight on file to calculate BMI. Constitutional Well developed. Well nourished.  Vascular Dorsalis pedis pulses palpable bilaterally. Posterior tibial pulses palpable bilaterally. Capillary refill normal to all digits.  No cyanosis or clubbing noted. Pedal hair growth normal.  Neurologic Normal speech. Oriented  to person, place, and time. Epicritic sensation to light touch grossly present bilaterally.  Dermatologic Right hallux and second digit heloma molle noted.  Mild hammertoe contracture noted.  Hyperkeratotic lesion noted on the second digit.  No pinpoint bleeding noted upon debridement  Orthopedic: Normal joint ROM without pain or crepitus bilaterally. No visible deformities. No bony tenderness.   Radiographs: None Assessment:   1. Heloma molle    Plan:  Patient was evaluated and treated and all questions answered.  Right hallux and second digit heloma molle -All questions and concerns were discussed with the patient in extensive detail using chisel blade handle the lesion was debrided down to healthy striated tissue no complication noted no pinpoint bleeding noted -Shoe gear modification was discussed -Spacer was dispensed.  No follow-ups on file.

## 2023-10-16 ENCOUNTER — Encounter (INDEPENDENT_AMBULATORY_CARE_PROVIDER_SITE_OTHER): Payer: Self-pay | Admitting: Otolaryngology

## 2023-10-16 ENCOUNTER — Ambulatory Visit (INDEPENDENT_AMBULATORY_CARE_PROVIDER_SITE_OTHER): Payer: Medicare Other | Admitting: Otolaryngology

## 2023-10-16 VITALS — BP 152/69 | HR 83 | Ht 69.0 in | Wt 172.0 lb

## 2023-10-16 DIAGNOSIS — K219 Gastro-esophageal reflux disease without esophagitis: Secondary | ICD-10-CM

## 2023-10-16 DIAGNOSIS — J312 Chronic pharyngitis: Secondary | ICD-10-CM | POA: Diagnosis not present

## 2023-10-16 DIAGNOSIS — J3089 Other allergic rhinitis: Secondary | ICD-10-CM

## 2023-10-16 DIAGNOSIS — R0982 Postnasal drip: Secondary | ICD-10-CM

## 2023-10-16 DIAGNOSIS — J383 Other diseases of vocal cords: Secondary | ICD-10-CM | POA: Diagnosis not present

## 2023-10-16 DIAGNOSIS — R09A2 Foreign body sensation, throat: Secondary | ICD-10-CM

## 2023-10-16 DIAGNOSIS — R0981 Nasal congestion: Secondary | ICD-10-CM | POA: Diagnosis not present

## 2023-10-16 MED ORDER — AZELASTINE HCL 0.1 % NA SOLN: 2.0000 | Freq: Two times a day (BID) | NASAL | 12 refills | Status: AC

## 2023-10-16 MED ORDER — SALINE SPRAY 0.65 % NA SOLN: 1.0000 | NASAL | 5 refills | Status: AC | PRN

## 2023-10-16 NOTE — Progress Notes (Unsigned)
ENT CONSULT:  Reason for Consult: sore throat, throat discomfort x 2 years   HPI: Alexander Castillo is an 82 y.o. male with hx He gets cough dry He has sore throat, wakes up and coughs a lot Denies heartburn  Dry mouth He has allergic rhinitis not on anything for it right now    Records Reviewed:  ***    Past Medical History:  Diagnosis Date   BENIGN PROSTATIC HYPERTROPHY 03/10/2008   BENIGN PROSTATIC HYPERTROPHY, HX OF 03/10/2008   CHEST PAIN 03/10/2008   at pre-op 01-17-18: per patient had cardiac eval at that time was sent for test (uaware type) ; reports nothing was abnormal; no recurrence of chest pain    DEGENERATIVE JOINT DISEASE 03/10/2008   EAR PAIN, LEFT 12/09/2008   HYPERLIPIDEMIA 03/10/2008   Osteoarthritis    left ankle   PSA, INCREASED 03/16/2009   RASH-NONVESICULAR 03/16/2009    Past Surgical History:  Procedure Laterality Date   ACHILLES TENDON LENGTHENING Left 12/31/2020   Procedure: Percutaneous Achilles Tendon Lengthening;  Surgeon: Toni Arthurs, MD;  Location: [facility];  Service: Orthopedics;  Laterality: Left;   back surgury  1998   lower   EYE SURGERY Bilateral    lens replacement for cataracts   hip replacement left Left 2002   JOINT REPLACEMENT Right    knee 2019, left hip 2017   s/p left knee arthroscopy  yrs ago   TOTAL ANKLE ARTHROPLASTY Left 12/31/2020   Procedure: Left total ankle replacement;  Surgeon: Toni Arthurs, MD;  Location: [facility];  Service: Orthopedics;  Laterality: Left;   TOTAL HIP ARTHROPLASTY Right 06/13/2016   Procedure: RIGHT TOTAL HIP ARTHROPLASTY ANTERIOR APPROACH;  Surgeon: Durene Romans, MD;  Location: [facility];  Service: Orthopedics;  Laterality: Right;  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   TOTAL KNEE ARTHROPLASTY Right 01/23/2018   Procedure: RIGHT TOTAL KNEE ARTHROPLASTY;  Surgeon: Durene Romans, MD;  Location: [facility];  Service: Orthopedics;  Laterality: Right;  70 mins   transurethral resection of prostate       Family History  Problem Relation Age of Onset   Dementia Mother     Social History:  reports that he has never smoked. He has quit using smokeless tobacco. He reports that he does not drink alcohol and does not use drugs.  Allergies: No Known Allergies  Medications: I have reviewed the patient's current medications.  The PMH, PSH, Medications, Allergies, and SH were reviewed and updated.  ROS: Constitutional: Negative for fever, weight loss and weight gain. Cardiovascular: Negative for chest pain and dyspnea on exertion. Respiratory: Is not experiencing shortness of breath at rest. Gastrointestinal: Negative for nausea and vomiting. Neurological: Negative for headaches. Psychiatric: The patient is not nervous/anxious  Blood pressure (!) 152/69, pulse 83, height 5\' 9"  (1.753 m), weight 172 lb (78 kg), SpO2 97%.  PHYSICAL EXAM:  Exam: General: Well-developed, well-nourished Communication and Voice: Clear pitch and clarity Respiratory Respiratory effort: Equal inspiration and expiration without stridor Cardiovascular Peripheral Vascular: Warm extremities with equal color/perfusion Eyes: No nystagmus with equal extraocular motion bilaterally Neuro/Psych/Balance: Patient oriented to person, place, and time; Appropriate mood and affect; Gait is intact with no imbalance; Cranial nerves I-XII are intact Head and Face Inspection: Normocephalic and atraumatic without mass or lesion Palpation: Facial skeleton intact without bony stepoffs Salivary Glands: No mass or tenderness Facial Strength: Facial motility symmetric and full bilaterally ENT Pinna: External ear intact and fully developed External canal: Canal is patent with intact skin  Tympanic Membrane: Clear and mobile External Nose: No scar or anatomic deformity Internal Nose: Septum is ***. No polyp, or purulence. Mucosal edema and erythema present.  Bilateral inferior turbinate hypertrophy.  Lips, Teeth, and gums:  Mucosa and teeth intact and viable TMJ: No pain to palpation with full mobility Oral cavity/oropharynx: No erythema or exudate, no lesions present Nasopharynx: No mass or lesion with intact mucosa Hypopharynx: Intact mucosa without pooling of secretions Larynx Glottic: Full true vocal cord mobility without lesion or mass Supraglottic: Normal appearing epiglottis and AE folds Interarytenoid Space: No or minimal pachydermia or edema Subglottic Space: Patent without lesion or edema Neck Neck and Trachea: Midline trachea without mass or lesion Thyroid: No mass or nodularity Lymphatics: No lymphadenopathy  Procedure:      Studies Reviewed:***  Assessment/Plan: No diagnosis found.  ***  Thank you for allowing me to participate in the care of this patient. Please do not hesitate to contact me with any questions or concerns.   Ashok Croon, MD Otolaryngology [facility] Phone: [phone] Fax: [phone]    10/16/2023, 9:59 AM

## 2023-10-16 NOTE — Patient Instructions (Signed)
-   start Asteline and Saline nasal spray  - gargle with salt water - hydrate well - try Reflux Gourmet   - Take Reflux Gourmet (natural supplement available on Amazon) to help with symptoms of chronic throat irritation

## 2023-11-18 DIAGNOSIS — W010XXA Fall on same level from slipping, tripping and stumbling without subsequent striking against object, initial encounter: Secondary | ICD-10-CM | POA: Diagnosis not present

## 2023-11-18 DIAGNOSIS — S41111A Laceration without foreign body of right upper arm, initial encounter: Secondary | ICD-10-CM | POA: Diagnosis not present

## 2023-12-20 DIAGNOSIS — H26493 Other secondary cataract, bilateral: Secondary | ICD-10-CM | POA: Diagnosis not present

## 2023-12-20 DIAGNOSIS — H02834 Dermatochalasis of left upper eyelid: Secondary | ICD-10-CM | POA: Diagnosis not present

## 2023-12-20 DIAGNOSIS — H524 Presbyopia: Secondary | ICD-10-CM | POA: Diagnosis not present

## 2023-12-20 DIAGNOSIS — H43813 Vitreous degeneration, bilateral: Secondary | ICD-10-CM | POA: Diagnosis not present

## 2023-12-20 DIAGNOSIS — H52223 Regular astigmatism, bilateral: Secondary | ICD-10-CM | POA: Diagnosis not present

## 2023-12-20 DIAGNOSIS — H353132 Nonexudative age-related macular degeneration, bilateral, intermediate dry stage: Secondary | ICD-10-CM | POA: Diagnosis not present

## 2023-12-27 ENCOUNTER — Encounter: Payer: Self-pay | Admitting: Podiatry

## 2023-12-27 ENCOUNTER — Ambulatory Visit: Payer: Medicare Other | Admitting: Podiatry

## 2023-12-27 DIAGNOSIS — L84 Corns and callosities: Secondary | ICD-10-CM

## 2023-12-27 NOTE — Progress Notes (Signed)
  Subjective:  Patient ID: Alexander Castillo, male    DOB: Dec 19, 1940,  MRN: [id number]  Chief Complaint  Patient presents with   Callouses    RM#12 Calluses on both feet left foot heel area is the worse causing pain to walk.    83 y.o. male presents with the above complaint.  Patient presents for right heloma molle between the big toe and the second toe.  He states has been present for quite some time hurts with ambulation worse with pressure and wanted discuss treatment options for it.  He wears some kind of toe separator which has not helped helps him a little bit pain scale is 5 out of 10 dull aching nature   Review of Systems: Negative except as noted in the HPI. Denies N/V/F/Ch.  Past Medical History:  Diagnosis Date   BENIGN PROSTATIC HYPERTROPHY 03/10/2008   BENIGN PROSTATIC HYPERTROPHY, HX OF 03/10/2008   CHEST PAIN 03/10/2008   at pre-op 01-17-18: per patient had cardiac eval at that time was sent for test (uaware type) ; reports nothing was abnormal; no recurrence of chest pain    DEGENERATIVE JOINT DISEASE 03/10/2008   EAR PAIN, LEFT 12/09/2008   HYPERLIPIDEMIA 03/10/2008   Osteoarthritis    left ankle   PSA, INCREASED 03/16/2009   RASH-NONVESICULAR 03/16/2009    Current Outpatient Medications:    azelastine  (ASTELIN ) 0.1 % nasal spray, Place 2 sprays into both nostrils 2 (two) times daily. Use in each nostril as directed, Disp: 30 mL, Rfl: 12   ciclopirox  (PENLAC ) 8 % solution, Apply topically at bedtime. Apply over nail and surrounding skin. Apply daily over previous coat. After seven (7) days, may remove with alcohol and continue cycle., Disp: 6.6 mL, Rfl: 11   sodium chloride  (OCEAN) 0.65 % SOLN nasal spray, Place 1 spray into both nostrils as needed., Disp: 30 mL, Rfl: 5  Social History   Tobacco Use  Smoking Status Never  Smokeless Tobacco Former    No Known Allergies Objective:  There were no vitals filed for this visit. There is no height or weight on file to  calculate BMI. Constitutional Well developed. Well nourished.  Vascular Dorsalis pedis pulses palpable bilaterally. Posterior tibial pulses palpable bilaterally. Capillary refill normal to all digits.  No cyanosis or clubbing noted. Pedal hair growth normal.  Neurologic Normal speech. Oriented to person, place, and time. Epicritic sensation to light touch grossly present bilaterally.  Dermatologic Right hallux and second digit heloma molle noted.  Mild hammertoe contracture noted.  Hyperkeratotic lesion noted on the second digit.  No pinpoint bleeding noted upon debridement  Orthopedic: Normal joint ROM without pain or crepitus bilaterally. No visible deformities. No bony tenderness.   Radiographs: None Assessment:   No diagnosis found.  Plan:  Patient was evaluated and treated and all questions answered.  Right hallux and second digit heloma molle -All questions and concerns were discussed with the patient in extensive detail using chisel blade handle the lesion was debrided down to healthy striated tissue no complication noted no pinpoint bleeding noted -Shoe gear modification was discussed -continue using spacer   No follow-ups on file.

## 2024-01-01 DIAGNOSIS — L812 Freckles: Secondary | ICD-10-CM | POA: Diagnosis not present

## 2024-01-01 DIAGNOSIS — L723 Sebaceous cyst: Secondary | ICD-10-CM | POA: Diagnosis not present

## 2024-01-01 DIAGNOSIS — D1801 Hemangioma of skin and subcutaneous tissue: Secondary | ICD-10-CM | POA: Diagnosis not present

## 2024-01-01 DIAGNOSIS — D225 Melanocytic nevi of trunk: Secondary | ICD-10-CM | POA: Diagnosis not present

## 2024-01-01 DIAGNOSIS — Z85820 Personal history of malignant melanoma of skin: Secondary | ICD-10-CM | POA: Diagnosis not present

## 2024-01-01 DIAGNOSIS — D485 Neoplasm of uncertain behavior of skin: Secondary | ICD-10-CM | POA: Diagnosis not present

## 2024-01-01 DIAGNOSIS — L821 Other seborrheic keratosis: Secondary | ICD-10-CM | POA: Diagnosis not present

## 2024-01-17 ENCOUNTER — Encounter (INDEPENDENT_AMBULATORY_CARE_PROVIDER_SITE_OTHER): Payer: Self-pay | Admitting: Otolaryngology

## 2024-01-17 ENCOUNTER — Ambulatory Visit (INDEPENDENT_AMBULATORY_CARE_PROVIDER_SITE_OTHER): Payer: Medicare Other | Admitting: Otolaryngology

## 2024-01-17 VITALS — BP 141/69 | HR 76

## 2024-01-17 DIAGNOSIS — J312 Chronic pharyngitis: Secondary | ICD-10-CM | POA: Diagnosis not present

## 2024-01-17 DIAGNOSIS — R09A2 Foreign body sensation, throat: Secondary | ICD-10-CM

## 2024-01-17 DIAGNOSIS — R0981 Nasal congestion: Secondary | ICD-10-CM

## 2024-01-17 DIAGNOSIS — J3089 Other allergic rhinitis: Secondary | ICD-10-CM

## 2024-01-17 DIAGNOSIS — J383 Other diseases of vocal cords: Secondary | ICD-10-CM | POA: Diagnosis not present

## 2024-01-17 DIAGNOSIS — R0982 Postnasal drip: Secondary | ICD-10-CM | POA: Diagnosis not present

## 2024-01-17 DIAGNOSIS — K219 Gastro-esophageal reflux disease without esophagitis: Secondary | ICD-10-CM

## 2024-01-17 DIAGNOSIS — R490 Dysphonia: Secondary | ICD-10-CM

## 2024-01-17 MED ORDER — FAMOTIDINE 20 MG PO TABS: 20.0000 mg | ORAL_TABLET | Freq: Two times a day (BID) | ORAL | 3 refills | Status: DC

## 2024-01-17 MED ORDER — DESLORATADINE 5 MG PO TABS: 5.0000 mg | ORAL_TABLET | Freq: Every day | ORAL | 3 refills | Status: AC

## 2024-01-17 NOTE — Progress Notes (Signed)
 ENT Progress Note:  Update 01/17/24 Discussed the use of AI scribe software for clinical note transcription with the patient, who gave verbal consent to proceed.  History of Present Illness   Alexander Castillo is an 83 year old male who presents for follow-up, initially seen for throat discomfort and cough/dysphonia.  He experiences throat discomfort described as scratchy and uncomfortable. The sensation is intermittent, localized to the area around voice box, and does not worsen after prolonged talking. It is not associated with pain and is not triggered by eating or swallowing saliva.  He has a scratchy cough that is more frequent at night, exacerbating the throat discomfort. He has no history of lung disease and has never smoked.  He has attempted various treatments for postnasal drainage and reflux, including Reflux Gourmet paste and azelastine  nasal spray, without significant improvement. Gargling with salt water  has not provided relief. He has not had voice therapy. He was recently bitten by a vaccinated dog (left hand) and is up to date on his vaccinations. He reports no pain from the bite.      Records Reviewed: Reason for Consult: sore throat, throat discomfort x 2 years   HPI: Alexander Castillo is an 83 y.o. male with hx of allergic rhinitis, here for chronic soreness/discomfort in his throat. When asked where he feels it, he points along the thyroid  cartilage. He gets dry cough at times. He wakes up with sore throat and coughs a lot. Does not cough anything up. Denies heartburn. Reports dry mouth. He does not take any medications.    Records Reviewed:  PCP office note 01/08/23 Chief Complaint:: wellness exam and ED follow up (Swelling in testicles and patient is requesting imaging)  , low b12. Depression, hld        HPI:  Alexander Castillo is a 83 y.o. male here for wellness exam; declines covid booster, o/w up to date                        Also seen at ED recently with start  levaquin  course for orchitis, overall at least 50 percent improved but still mild to mod tender, swelling, asking for imaging follow up.  Pt denies chest pain, increased sob or doe, wheezing, orthopnea, PND, increased LE swelling, palpitations, dizziness or syncope.   Pt denies polydipsia, polyuria, or new focal neuro s/s.    Pt denies fever, wt loss, night sweats, loss of appetite, or other constitutional symptoms  Denies worsening depressive symptoms, suicidal ideation, or panic    Past Medical History:  Diagnosis Date   BENIGN PROSTATIC HYPERTROPHY 03/10/2008   BENIGN PROSTATIC HYPERTROPHY, HX OF 03/10/2008   CHEST PAIN 03/10/2008   at pre-op 01-17-18: per patient had cardiac eval at that time was sent for test (uaware type) ; reports nothing was abnormal; no recurrence of chest pain    DEGENERATIVE JOINT DISEASE 03/10/2008   EAR PAIN, LEFT 12/09/2008   HYPERLIPIDEMIA 03/10/2008   Osteoarthritis    left ankle   PSA, INCREASED 03/16/2009   RASH-NONVESICULAR 03/16/2009    Past Surgical History:  Procedure Laterality Date   ACHILLES TENDON LENGTHENING Left 12/31/2020   Procedure: Percutaneous Achilles Tendon Lengthening;  Surgeon: Kit Rush, MD;  Location: [facility];  Service: Orthopedics;  Laterality: Left;   back surgury  1998   lower   EYE SURGERY Bilateral    lens replacement for cataracts   hip replacement left Left 2002   JOINT REPLACEMENT Right  knee 2019, left hip 2017   s/p left knee arthroscopy  yrs ago   TOTAL ANKLE ARTHROPLASTY Left 12/31/2020   Procedure: Left total ankle replacement;  Surgeon: Kit Rush, MD;  Location: [facility];  Service: Orthopedics;  Laterality: Left;   TOTAL HIP ARTHROPLASTY Right 06/13/2016   Procedure: RIGHT TOTAL HIP ARTHROPLASTY ANTERIOR APPROACH;  Surgeon: Donnice Car, MD;  Location: [facility];  Service: Orthopedics;  Laterality: Right;  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   TOTAL KNEE ARTHROPLASTY Right 01/23/2018   Procedure:  RIGHT TOTAL KNEE ARTHROPLASTY;  Surgeon: Car Donnice, MD;  Location: [facility];  Service: Orthopedics;  Laterality: Right;  70 mins   transurethral resection of prostate      Family History  Problem Relation Age of Onset   Dementia Mother     Social History:  reports that he has never smoked. He has quit using smokeless tobacco. He reports that he does not drink alcohol and does not use drugs.  Allergies: No Known Allergies  Medications: I have reviewed the patient's current medications.  The PMH, PSH, Medications, Allergies, and SH were reviewed and updated.  ROS: Constitutional: Negative for fever, weight loss and weight gain. Cardiovascular: Negative for chest pain and dyspnea on exertion. Respiratory: Is not experiencing shortness of breath at rest. Gastrointestinal: Negative for nausea and vomiting. Neurological: Negative for headaches. Psychiatric: The patient is not nervous/anxious  Blood pressure (!) 141/69, pulse 76, SpO2 99%.  PHYSICAL EXAM:  Exam: General: Well-developed, well-nourished Communication and Voice: raspy Respiratory Respiratory effort: Equal inspiration and expiration without stridor Cardiovascular Peripheral Vascular: Warm extremities with equal color/perfusion Eyes: No nystagmus with equal extraocular motion bilaterally Neuro/Psych/Balance: Patient oriented to person, place, and time; Appropriate mood and affect; Gait is intact with no imbalance; Cranial nerves I-XII are intact Head and Face Inspection: Normocephalic and atraumatic without mass or lesion Palpation: Facial skeleton intact without bony stepoffs Salivary Glands: No mass or tenderness Facial Strength: Facial motility symmetric and full bilaterally ENT Pinna: External ear intact and fully developed External canal: Canal is patent with intact skin Tympanic Membrane: Clear and mobile External Nose: No scar or anatomic deformity Internal Nose: Septum is relatively straight. No polyp,  or purulence. Mucosal edema and erythema present.  Bilateral inferior turbinate hypertrophy.  Lips, Teeth, and gums: Mucosa and teeth intact and viable TMJ: No pain to palpation with full mobility Oral cavity/oropharynx: No erythema or exudate, no lesions present Nasopharynx: No mass or lesion with intact mucosa Hypopharynx: Intact mucosa without pooling of secretions Larynx Glottic: Full true vocal cord mobility without lesion or mass, evidence of b/l VF atrophy and glottic gap, with supraglottic compression when asked to sustain an E Supraglottic: Normal appearing epiglottis and AE folds Interarytenoid Space: Moderate pachydermia edema Subglottic Space: Patent without lesion or edema Neck Neck and Trachea: Midline trachea without mass or lesion, tender along straps and lateral aspect of thyroid  cartilage  Thyroid : No mass or nodularity Lymphatics: No lymphadenopathy  Procedure: Preoperative diagnosis: throat discomfort x 2 yrs  Postoperative diagnosis:   Same + GERD LPR + VF atrophy and glottic insufficiency/supraglottic compression with phonation   Procedure: Flexible fiberoptic laryngoscopy  Surgeon: Elena Larry, MD  Anesthesia: Topical lidocaine  and Afrin Complications: None Condition is stable throughout exam  Indications and consent:  The patient presents to the clinic with chronic sensation of throat discomfort Indirect laryngoscopy view was incomplete. Thus it was recommended that they undergo a flexible fiberoptic laryngoscopy. All of the risks, benefits, and potential complications  were reviewed with the patient preoperatively and verbal informed consent was obtained.  Procedure: The patient was seated upright in the clinic. Topical lidocaine  and Afrin were applied to the nasal cavity. After adequate anesthesia had occurred, I then proceeded to pass the flexible telescope into the nasal cavity. The nasal cavity was patent without rhinorrhea or polyp. The nasopharynx  was also patent without mass or lesion. The base of tongue was visualized and was normal. There were no signs of pooling of secretions in the piriform sinuses. The true vocal folds were mobile bilaterally. There were no signs of glottic or supraglottic mucosal lesion or mass. There was moderate interarytenoid pachydermia and post cricoid edema. The telescope was then slowly withdrawn and the patient tolerated the procedure throughout.   Studies Reviewed:none  Assessment/Plan: Encounter Diagnoses  Name Primary?   Glottic insufficiency    Post-nasal drip    Chronic GERD    Age-related vocal fold atrophy    Environmental and seasonal allergies    Globus sensation    Chronic sore throat Yes   Nasal congestion    Dysphonia [R28.31]     83 year old male with history of allergic rhinitis here for chronic sensation of sore throat discomfort present for 2 years worse in the morning when he gets up and has dry cough.  Exam is overall unremarkable without any evidence of neck masses and flexible laryngoscopy with evidence of bilateral vocal fold atrophy and supraglottic compression with phonation.  He was also tender along the strap musculature near the thyroid  cartilage.  He had evidence of nasal congestion postnasal drainage and GERD LPR on exam.  Chronic sore throat throat discomfort -There was no inflammation along the posterior oropharynx I do suspect that his symptoms are related to either undertreated GERD LPR or muscle strain attributable to presbylarynx when he attempts to phonate and project better -He does not take any medications (personal preference and no need for medications) -We discussed a trial of nasal spray for postnasal drainage control and reflux Gourmet with diet and lifestyle changes to minimize reflux  2.  Chronic nasal congestion and postnasal drainage suspected environmental allergies -Azelastine  nasal spray 2 puffs bilateral nares twice daily and Ocean spray  3.  GERD  LPR -Diet and lifestyle changes to minimize reflux -Trial of reflux Gourmet  4.  Vocal fold atrophy and glottic insufficiency -We discussed the trial of voice therapy but he would like to defer at this time  Update 01/17/24 Assessment and Plan    Throat Discomfort Intermittent scratchy throat, worse at night, likely due to muscle strain from thin vocal cords/VF atrophy and glottic insufficiency. Repeat scope exam today without pathology. Reflux and postnasal drainage treatments (Reflux Gourmet paste, azelastine  nasal spray) were ineffective. No sinus infection or other abnormalities on examination. Discussed Pepcid  for reflux and Clarinex  for allergies. Patient agreed to try medications despite reluctance to take pills. Advised discontinuation if no improvement after 1-2 months. - Prescribe Pepcid  20 mg BID for reflux - Prescribe Clarinex  5 mg QHS for post-nasal drainage and suspected environmental allergies  Chronic nasal congestion and postnasal drainage suspected environmental allergies - continue Azelastine  nasal spray 2 puffs bilateral nares twice daily and Ocean spray - add Clarinex  5 mg QHS  GERD LPR -Diet and lifestyle changes to minimize reflux - start Pepcid  20 mg BID -Continue Reflux Gourmet  Vocal fold atrophy and glottic insufficiency -We discussed the trial of voice therapy but he would like to defer at this time  Follow-up - Follow  up in 1-2 months if no response to treatment        I spent 30 minutes in total face-to-face time and in reviewing records during pre-charting, more than 50% of which was spent in counseling and coordination of care, reviewing test results, reviewing medications and treatment regimen and/or in discussing or reviewing the diagnosis, the prognosis and treatment options. Pertinent laboratory and imaging test results that were available during this visit with the patient were reviewed by me and considered in my medical decision making (see chart for  details).     Elena Larry, MD Otolaryngology [facility] Phone: [phone] Fax: [phone]    01/17/2024, 1:13 PM

## 2024-01-24 ENCOUNTER — Other Ambulatory Visit (INDEPENDENT_AMBULATORY_CARE_PROVIDER_SITE_OTHER): Payer: Self-pay | Admitting: Otolaryngology

## 2024-02-22 ENCOUNTER — Ambulatory Visit (INDEPENDENT_AMBULATORY_CARE_PROVIDER_SITE_OTHER): Payer: Medicare Other | Admitting: Internal Medicine

## 2024-02-22 ENCOUNTER — Encounter: Payer: Self-pay | Admitting: Internal Medicine

## 2024-02-22 ENCOUNTER — Other Ambulatory Visit: Payer: Self-pay | Admitting: Internal Medicine

## 2024-02-22 VITALS — BP 128/68 | HR 60 | Temp 98.3°F | Ht 69.0 in | Wt 174.0 lb

## 2024-02-22 DIAGNOSIS — R32 Unspecified urinary incontinence: Secondary | ICD-10-CM

## 2024-02-22 DIAGNOSIS — E538 Deficiency of other specified B group vitamins: Secondary | ICD-10-CM | POA: Diagnosis not present

## 2024-02-22 DIAGNOSIS — J309 Allergic rhinitis, unspecified: Secondary | ICD-10-CM

## 2024-02-22 DIAGNOSIS — R300 Dysuria: Secondary | ICD-10-CM

## 2024-02-22 LAB — HEPATIC FUNCTION PANEL
ALT: 28 U/L (ref 0–53)
AST: 39 U/L — ABNORMAL HIGH (ref 0–37)
Albumin: 4.4 g/dL (ref 3.5–5.2)
Alkaline Phosphatase: 59 U/L (ref 39–117)
Bilirubin, Direct: 0.1 mg/dL (ref 0.0–0.3)
Total Bilirubin: 0.4 mg/dL (ref 0.2–1.2)
Total Protein: 7.1 g/dL (ref 6.0–8.3)

## 2024-02-22 LAB — URINALYSIS, ROUTINE W REFLEX MICROSCOPIC
Bilirubin Urine: NEGATIVE
Hgb urine dipstick: NEGATIVE
Ketones, ur: NEGATIVE
Nitrite: NEGATIVE
RBC / HPF: NONE SEEN (ref 0–?)
Specific Gravity, Urine: <=1.005 — AB (ref 1.000–1.030)
Total Protein, Urine: NEGATIVE
Urine Glucose: NEGATIVE
Urobilinogen, UA: 0.2 (ref 0.0–1.0)
pH: 6 (ref 5.0–8.0)

## 2024-02-22 LAB — CBC WITH DIFFERENTIAL/PLATELET
Basophils Absolute: 0 10*3/uL (ref 0.0–0.1)
Basophils Relative: 0.7 % (ref 0.0–3.0)
Eosinophils Absolute: 0.1 10*3/uL (ref 0.0–0.7)
Eosinophils Relative: 1.4 % (ref 0.0–5.0)
HCT: 40.6 % (ref 39.0–52.0)
Hemoglobin: 13.3 g/dL (ref 13.0–17.0)
Lymphocytes Relative: 27.6 % (ref 12.0–46.0)
Lymphs Abs: 1.7 10*3/uL (ref 0.7–4.0)
MCHC: 32.8 g/dL (ref 30.0–36.0)
MCV: 91.2 fl (ref 78.0–100.0)
Monocytes Absolute: 0.6 10*3/uL (ref 0.1–1.0)
Monocytes Relative: 9.9 % (ref 3.0–12.0)
Neutro Abs: 3.7 10*3/uL (ref 1.4–7.7)
Neutrophils Relative %: 60.4 % (ref 43.0–77.0)
Platelets: 251 10*3/uL (ref 150.0–400.0)
RBC: 4.45 Mil/uL (ref 4.22–5.81)
RDW: 14 % (ref 11.5–15.5)
WBC: 6.1 10*3/uL (ref 4.0–10.5)

## 2024-02-22 LAB — BASIC METABOLIC PANEL
BUN: 16 mg/dL (ref 6–23)
CO2: 27 meq/L (ref 19–32)
Calcium: 9.7 mg/dL (ref 8.4–10.5)
Chloride: 106 meq/L (ref 96–112)
Creatinine, Ser: 0.86 mg/dL (ref 0.40–1.50)
GFR: 79.19 mL/min (ref >60.00)
Glucose, Bld: 103 mg/dL — ABNORMAL HIGH (ref 70–99)
Potassium: 4 meq/L (ref 3.5–5.1)
Sodium: 139 meq/L (ref 135–145)

## 2024-02-22 MED ORDER — CIPROFLOXACIN HCL 500 MG PO TABS: 500.0000 mg | ORAL_TABLET | Freq: Two times a day (BID) | ORAL | 0 refills | Status: AC

## 2024-02-22 NOTE — Progress Notes (Signed)
 Patient ID: Alexander Castillo, male   DOB: 1941/03/03, 83 y.o.   MRN: [id number]        Chief Complaint: follow up  Chief Complaint  Patient presents with   Follow-up    6 month f/u. Patient states he thinks he might have a UTI, pt has some burning when using the bathroom. Bee happening for a couple of weeks   , urinary leakage, allergies, b12 deficiency       HPI:  Alexander Castillo is a 83 y.o. male here with c/e over 1 wk onset urinary leakage with mild discomfort but Denies urinary symptoms such as frequency, urgency, flank pain, hematuria or n/v, fever, chills.  Does have several wks ongoing nasal allergy symptoms with clearish congestion, itch and sneezing, without fever, pain, ST, cough, swelling or wheezing.  Pt denies chest pain, increased sob or doe, wheezing, orthopnea, PND, increased LE swelling, palpitations, dizziness or syncope.   Pt denies polydipsia, polyuria, or new focal neuro s/s.          Wt Readings from Last 3 Encounters:  02/22/24 174 lb (78.9 kg)  10/16/23 172 lb (78 kg)  08/21/23 170 lb (77.1 kg)   BP Readings from Last 3 Encounters:  02/22/24 128/68  01/17/24 (!) 141/69  10/16/23 (!) 152/69         Past Medical History:  Diagnosis Date   BENIGN PROSTATIC HYPERTROPHY 03/10/2008   BENIGN PROSTATIC HYPERTROPHY, HX OF 03/10/2008   CHEST PAIN 03/10/2008   at pre-op 01-17-18: per patient had cardiac eval at that time was sent for test (uaware type) ; reports nothing was abnormal; no recurrence of chest pain    DEGENERATIVE JOINT DISEASE 03/10/2008   EAR PAIN, LEFT 12/09/2008   HYPERLIPIDEMIA 03/10/2008   Osteoarthritis    left ankle   PSA, INCREASED 03/16/2009   RASH-NONVESICULAR 03/16/2009   Past Surgical History:  Procedure Laterality Date   ACHILLES TENDON LENGTHENING Left 12/31/2020   Procedure: Percutaneous Achilles Tendon Lengthening;  Surgeon: Toni Arthurs, MD;  Location: [facility];  Service: Orthopedics;  Laterality: Left;   back surgury  1998    lower   EYE SURGERY Bilateral    lens replacement for cataracts   hip replacement left Left 2002   JOINT REPLACEMENT Right    knee 2019, left hip 2017   s/p left knee arthroscopy  yrs ago   TOTAL ANKLE ARTHROPLASTY Left 12/31/2020   Procedure: Left total ankle replacement;  Surgeon: Toni Arthurs, MD;  Location: [facility];  Service: Orthopedics;  Laterality: Left;   TOTAL HIP ARTHROPLASTY Right 06/13/2016   Procedure: RIGHT TOTAL HIP ARTHROPLASTY ANTERIOR APPROACH;  Surgeon: Durene Romans, MD;  Location: [facility];  Service: Orthopedics;  Laterality: Right;  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   TOTAL KNEE ARTHROPLASTY Right 01/23/2018   Procedure: RIGHT TOTAL KNEE ARTHROPLASTY;  Surgeon: Durene Romans, MD;  Location: [facility];  Service: Orthopedics;  Laterality: Right;  70 mins   transurethral resection of prostate      reports that he has never smoked. He has quit using smokeless tobacco. He reports that he does not drink alcohol and does not use drugs. family history includes Dementia in his mother. No Known Allergies Current Outpatient Medications on File Prior to Visit  Medication Sig Dispense Refill   desloratadine (CLARINEX) 5 MG tablet Take 1 tablet (5 mg total) by mouth daily. 90 tablet 3   azelastine (ASTELIN) 0.1 % nasal spray Place 2 sprays into both nostrils 2 (  two) times daily. Use in each nostril as directed (Patient not taking: Reported on 02/22/2024) 30 mL 12   sodium chloride (OCEAN) 0.65 % SOLN nasal spray Place 1 spray into both nostrils as needed. (Patient not taking: Reported on 02/22/2024) 30 mL 5   No current facility-administered medications on file prior to visit.        ROS:  All others reviewed and negative.  Objective        PE:  BP 128/68 (BP Location: Left Arm, Patient Position: Sitting, Cuff Size: Normal)   Pulse 60   Temp 98.3 F (36.8 C) (Oral)   Ht 5\' 9"  (1.753 m)   Wt 174 lb (78.9 kg)   SpO2 94%   BMI 25.70 kg/m                 Constitutional: Pt  appears in NAD               HENT: Head: NCAT.                Right Ear: External ear normal.                 Left Ear: External ear normal.                Eyes: . Pupils are equal, round, and reactive to light. Conjunctivae and EOM are normal               Nose: without d/c or deformity               Neck: Neck supple. Gross normal ROM               Cardiovascular: Normal rate and regular rhythm.                 Pulmonary/Chest: Effort normal and breath sounds without rales or wheezing.                Abd:  Soft, NT, ND, + BS, no organomegaly               Neurological: Pt is alert. At baseline orientation, motor grossly intact               Skin: Skin is warm. No rashes, no other new lesions, LE edema - none               Psychiatric: Pt behavior is normal without agitation   Micro: none  Cardiac tracings I have personally interpreted today:  none  Pertinent Radiological findings (summarize): none   Lab Results  Component Value Date   WBC 6.1 02/22/2024   HGB 13.3 02/22/2024   HCT 40.6 02/22/2024   PLT 251.0 02/22/2024   GLUCOSE 103 (H) 02/22/2024   CHOL 157 08/18/2023   TRIG 67.0 08/18/2023   HDL 49.90 08/18/2023   LDLCALC 94 08/18/2023   ALT 28 02/22/2024   AST 39 (H) 02/22/2024   NA 139 02/22/2024   K 4.0 02/22/2024   CL 106 02/22/2024   CREATININE 0.86 02/22/2024   BUN 16 02/22/2024   CO2 27 02/22/2024   TSH 2.36 08/18/2023   PSA 2.07 08/11/2021   Assessment/Plan:  Alexander Castillo is a 83 y.o. White or Caucasian [1] male with  has a past medical history of BENIGN PROSTATIC HYPERTROPHY (03/10/2008), BENIGN PROSTATIC HYPERTROPHY, HX OF (03/10/2008), CHEST PAIN (03/10/2008), DEGENERATIVE JOINT DISEASE (03/10/2008), EAR PAIN, LEFT (12/09/2008), HYPERLIPIDEMIA (03/10/2008), Osteoarthritis, PSA, INCREASED (03/16/2009), and RASH-NONVESICULAR (03/16/2009).  Allergic  rhinitis Mild to mod, for restart clarinex 5 every day prn,  to f/u any worsening symptoms or concerns  B12  deficiency Lab Results  Component Value Date   VITAMINB12 737 08/18/2023   Stable, cont oral replacement - b12 1000 mcg qd   Dysuria Possible UTI - for ua and culture, tx pending results  Urinary incontinence With recent worsening, for refer urology  Followup: Return in about 6 months (around 08/24/2024).  Oliver Barre, MD 02/24/2024 10:40 PM [facility]

## 2024-02-22 NOTE — Patient Instructions (Signed)
 Please continue all other medications as before, and refills have been done if requested.  Please have the pharmacy call with any other refills you may need.  Please continue your efforts at being more active, low cholesterol diet, and weight control.  Please keep your appointments with your specialists as you may have planned  You will be contacted regarding the referral for: urology  Please go to the LAB at the blood drawing area for the tests to be done  You will be contacted by phone if any changes need to be made immediately.  Otherwise, you will receive a letter about your results with an explanation, but please check with MyChart first.  Please make an Appointment to return in 6 months, or sooner if needed

## 2024-02-24 ENCOUNTER — Encounter: Payer: Self-pay | Admitting: Internal Medicine

## 2024-02-24 NOTE — Assessment & Plan Note (Signed)
 With recent worsening, for refer urology

## 2024-02-24 NOTE — Assessment & Plan Note (Signed)
 Possible UTI - for ua and culture, tx pending results

## 2024-02-24 NOTE — Assessment & Plan Note (Signed)
Lab Results  Component Value Date   VITAMINB12 737 08/18/2023   Stable, cont oral replacement - b12 1000 mcg qd

## 2024-02-24 NOTE — Assessment & Plan Note (Signed)
 Mild to mod, for restart clarinex 5 every day prn,  to f/u any worsening symptoms or concerns

## 2024-02-25 LAB — URINE CULTURE

## 2024-04-03 DIAGNOSIS — R3912 Poor urinary stream: Secondary | ICD-10-CM | POA: Diagnosis not present

## 2024-04-03 DIAGNOSIS — R350 Frequency of micturition: Secondary | ICD-10-CM | POA: Diagnosis not present

## 2024-05-17 ENCOUNTER — Ambulatory Visit: Admitting: Podiatry

## 2024-05-17 DIAGNOSIS — M25871 Other specified joint disorders, right ankle and foot: Secondary | ICD-10-CM | POA: Diagnosis not present

## 2024-05-17 DIAGNOSIS — M65971 Unspecified synovitis and tenosynovitis, right ankle and foot: Secondary | ICD-10-CM

## 2024-05-17 DIAGNOSIS — R350 Frequency of micturition: Secondary | ICD-10-CM | POA: Diagnosis not present

## 2024-05-17 NOTE — Progress Notes (Signed)
  Subjective:  Patient ID: Alexander Castillo, male    DOB: 09-11-1941,  MRN: [id number]  Chief Complaint  Patient presents with   Heloma molle    83 y.o. male presents with the above complaint. Patient presents with right submetatarsal 1 pain.  Patient states this came on only hurts with ambulation or shoe pressure.  He denies seeing anyone as part of same he would like to discuss treatment options for it.  He states that his capsulitis pain has been on for quite some time.  Pain scale is 5 out of 10 dull aching nature   Review of Systems: Negative except as noted in the HPI. Denies N/V/F/Ch.  Past Medical History:  Diagnosis Date   BENIGN PROSTATIC HYPERTROPHY 03/10/2008   BENIGN PROSTATIC HYPERTROPHY, HX OF 03/10/2008   CHEST PAIN 03/10/2008   at pre-op 01-17-18: per patient had cardiac eval at that time was sent for test (uaware type) ; reports nothing was abnormal; no recurrence of chest pain    DEGENERATIVE JOINT DISEASE 03/10/2008   EAR PAIN, LEFT 12/09/2008   HYPERLIPIDEMIA 03/10/2008   Osteoarthritis    left ankle   PSA, INCREASED 03/16/2009   RASH-NONVESICULAR 03/16/2009    Current Outpatient Medications:    azelastine  (ASTELIN ) 0.1 % nasal spray, Place 2 sprays into both nostrils 2 (two) times daily. Use in each nostril as directed (Patient not taking: Reported on 02/22/2024), Disp: 30 mL, Rfl: 12   desloratadine  (CLARINEX ) 5 MG tablet, Take 1 tablet (5 mg total) by mouth daily., Disp: 90 tablet, Rfl: 3   sodium chloride  (OCEAN) 0.65 % SOLN nasal spray, Place 1 spray into both nostrils as needed. (Patient not taking: Reported on 02/22/2024), Disp: 30 mL, Rfl: 5  Social History   Tobacco Use  Smoking Status Never  Smokeless Tobacco Former    No Known Allergies Objective:  There were no vitals filed for this visit. There is no height or weight on file to calculate BMI. Constitutional Well developed. Well nourished.  Vascular Dorsalis pedis pulses palpable  bilaterally. Posterior tibial pulses palpable bilaterally. Capillary refill normal to all digits.  No cyanosis or clubbing noted. Pedal hair growth normal.  Neurologic Normal speech. Oriented to person, place, and time. Epicritic sensation to light touch grossly present bilaterally.  Dermatologic Nails well groomed and normal in appearance. No open wounds. No skin lesions.  Orthopedic: Pain on palpation right sesamoidal complex pain at the sesamoid apparatus.  No pain at the first metatarsophalangeal joint.  No pain with range of motion of the joint.   Radiographs: None Assessment:   1. Synovitis of right foot   2. Sesamoiditis of right foot    Plan:  Patient was evaluated and treated and all questions answered.  Right sesamoiditis with underlying synovitis - Questions and concerns were discussed with the patient extensively given the amount of capsulitis/synovitis that is present patient will benefit from steroid injection to help decrease inflammatory mesenteric pain.  Patient agrees with steroid injection -A steroid injection was performed at right sesamoid synovitis using 1% plain Lidocaine  and 10 mg of Kenalog . This was well tolerated. - Sugar modification discussed    No follow-ups on file.

## 2024-06-19 DIAGNOSIS — R350 Frequency of micturition: Secondary | ICD-10-CM | POA: Diagnosis not present

## 2024-07-15 ENCOUNTER — Ambulatory Visit: Payer: Medicare Other

## 2024-07-15 ENCOUNTER — Telehealth: Payer: Self-pay

## 2024-07-15 DIAGNOSIS — E559 Vitamin D deficiency, unspecified: Secondary | ICD-10-CM

## 2024-07-15 DIAGNOSIS — E7800 Pure hypercholesterolemia, unspecified: Secondary | ICD-10-CM

## 2024-07-15 DIAGNOSIS — E538 Deficiency of other specified B group vitamins: Secondary | ICD-10-CM

## 2024-07-15 NOTE — Telephone Encounter (Signed)
 Copied from CRM [id number]. Topic: Clinical - Request for Lab/Test Order >> Jul 15, 2024  2:27 PM Robinson H wrote: Reason for CRM: Patient wants to have physical labs done before his physical on 9/11.  Aida [phone]

## 2024-07-17 ENCOUNTER — Ambulatory Visit: Admitting: Podiatry

## 2024-07-17 DIAGNOSIS — L97512 Non-pressure chronic ulcer of other part of right foot with fat layer exposed: Secondary | ICD-10-CM | POA: Diagnosis not present

## 2024-07-17 NOTE — Telephone Encounter (Signed)
 Ok this is done

## 2024-07-17 NOTE — Progress Notes (Signed)
 Subjective:  Patient ID: Alexander Castillo, male    DOB: 06/23/1941,  MRN: [id number]  Chief Complaint  Patient presents with   Callouses    83 y.o. male presents for wound care.  Patient presents with right second digit ulceration fat layer exposed that has been present for quite some time.  He states that started opening up wanted to get it evaluated.  He has not seen anyone as part of same is causing some discomfort pain scale is 5 out of 10 dull aching nature.   Review of Systems: Negative except as noted in the HPI. Denies N/V/F/Ch.  Past Medical History:  Diagnosis Date   BENIGN PROSTATIC HYPERTROPHY 03/10/2008   BENIGN PROSTATIC HYPERTROPHY, HX OF 03/10/2008   CHEST PAIN 03/10/2008   at pre-op 01-17-18: per patient had cardiac eval at that time was sent for test (uaware type) ; reports nothing was abnormal; no recurrence of chest pain    DEGENERATIVE JOINT DISEASE 03/10/2008   EAR PAIN, LEFT 12/09/2008   HYPERLIPIDEMIA 03/10/2008   Osteoarthritis    left ankle   PSA, INCREASED 03/16/2009   RASH-NONVESICULAR 03/16/2009    Current Outpatient Medications:    azelastine  (ASTELIN ) 0.1 % nasal spray, Place 2 sprays into both nostrils 2 (two) times daily. Use in each nostril as directed (Patient not taking: Reported on 02/22/2024), Disp: 30 mL, Rfl: 12   desloratadine  (CLARINEX ) 5 MG tablet, Take 1 tablet (5 mg total) by mouth daily., Disp: 90 tablet, Rfl: 3   sodium chloride  (OCEAN) 0.65 % SOLN nasal spray, Place 1 spray into both nostrils as needed. (Patient not taking: Reported on 02/22/2024), Disp: 30 mL, Rfl: 5  Social History   Tobacco Use  Smoking Status Never  Smokeless Tobacco Former    No Known Allergies Objective:  There were no vitals filed for this visit. There is no height or weight on file to calculate BMI. Constitutional Well developed. Well nourished.  Vascular Dorsalis pedis pulses palpable bilaterally. Posterior tibial pulses palpable bilaterally. Capillary  refill normal to all digits.  No cyanosis or clubbing noted. Pedal hair growth normal.  Neurologic Normal speech. Oriented to person, place, and time. Protective sensation absent  Dermatologic Wound Location: Right second digit ulceration with fat layer exposed.  Pain on palpation.  No malodor present no erythema noted.  No purulent drainage noted does not probe Wound Base: Mixed Granular/Fibrotic Peri-wound: Calloused Exudate: Scant/small amount Serosanguinous exudate Wound Measurements: - See below  Orthopedic: No pain to palpation either foot.   Radiographs: None Assessment:   1. Skin ulcer of second toe of right foot with fat layer exposed (HCC)    Plan:  Patient was evaluated and treated and all questions answered.  Ulcer right second digit ulceration with exposed -Debridement as below. -Dressed with Betadine wet-to-dry, DSD. -Continue off-loading with surgical shoe.  Procedure: Excisional Debridement of Wound Tool: Sharp chisel blade/tissue nipper Rationale: Removal of non-viable soft tissue from the wound to promote healing.  Anesthesia: none Pre-Debridement Wound Measurements: 0.5 cm x 0.5 cm x 0.3 cm  Post-Debridement Wound Measurements: 0.6 cm x 0.6 cm x 0.3 cm  Type of Debridement: Sharp Excisional Tissue Removed: Non-viable soft tissue Blood loss: Minimal (<50cc) Depth of Debridement: subcutaneous tissue. Technique: Sharp excisional debridement to bleeding, viable wound base.  Wound Progress: This my initial evaluation will continue to monitor progression of the wound Site healing conversation 7 Dressing: Dry, sterile, compression dressing. Disposition: Patient tolerated procedure well. Patient to return in 1 week for follow-up.  No follow-ups on file.

## 2024-08-14 ENCOUNTER — Ambulatory Visit: Admitting: Podiatry

## 2024-08-14 DIAGNOSIS — L97512 Non-pressure chronic ulcer of other part of right foot with fat layer exposed: Secondary | ICD-10-CM

## 2024-08-14 NOTE — Progress Notes (Signed)
  Subjective:  Patient ID: Alexander Castillo, male    DOB: [birth year],  MRN: [id number]  Chief Complaint  Patient presents with   Foot Ulcer    Right foot second toe ulcer follow up  Pt stated that things are about the same     83 y.o. male presents for wound care.  Patient presents for follow-up of right second digit ulceration with a large close he states he is doing better.  Denies any other acute complaints been doing iodine dressings.   Review of Systems: Negative except as noted in the HPI. Denies N/V/F/Ch.  Past Medical History:  Diagnosis Date   BENIGN PROSTATIC HYPERTROPHY 03/10/2008   BENIGN PROSTATIC HYPERTROPHY, HX OF 03/10/2008   CHEST PAIN 03/10/2008   at pre-op 01-17-18: per patient had cardiac eval at that time was sent for test (uaware type) ; reports nothing was abnormal; no recurrence of chest pain    DEGENERATIVE JOINT DISEASE 03/10/2008   EAR PAIN, LEFT 12/09/2008   HYPERLIPIDEMIA 03/10/2008   Osteoarthritis    left ankle   PSA, INCREASED 03/16/2009   RASH-NONVESICULAR 03/16/2009    Current Outpatient Medications:    azelastine  (ASTELIN ) 0.1 % nasal spray, Place 2 sprays into both nostrils 2 (two) times daily. Use in each nostril as directed (Patient not taking: Reported on 02/22/2024), Disp: 30 mL, Rfl: 12   desloratadine  (CLARINEX ) 5 MG tablet, Take 1 tablet (5 mg total) by mouth daily., Disp: 90 tablet, Rfl: 3   sodium chloride  (OCEAN) 0.65 % SOLN nasal spray, Place 1 spray into both nostrils as needed. (Patient not taking: Reported on 02/22/2024), Disp: 30 mL, Rfl: 5  Social History   Tobacco Use  Smoking Status Never  Smokeless Tobacco Former    No Known Allergies Objective:  There were no vitals filed for this visit. There is no height or weight on file to calculate BMI. Constitutional Well developed. Well nourished.  Vascular Dorsalis pedis pulses palpable bilaterally. Posterior tibial pulses palpable bilaterally. Capillary refill normal to all digits.   No cyanosis or clubbing noted. Pedal hair growth normal.  Neurologic Normal speech. Oriented to person, place, and time. Protective sensation absent  Dermatologic Right second digit ulceration completely reepithelialized no further signs of healing noted.  No further signs of infection noted.  Orthopedic: No pain to palpation either foot.   Radiographs: None Assessment:   1. Skin ulcer of second toe of right foot with fat layer exposed (HCC)     Plan:  Patient was evaluated and treated and all questions answered.  Ulcer right second digit ulceration with exposed - Clinically healed pressure discharge, care if any foot and ankle issues on her feet she will come back and see me.  Discussed shoe gear modification.  Take care  No follow-ups on file.

## 2024-08-21 ENCOUNTER — Other Ambulatory Visit (INDEPENDENT_AMBULATORY_CARE_PROVIDER_SITE_OTHER)

## 2024-08-21 DIAGNOSIS — E538 Deficiency of other specified B group vitamins: Secondary | ICD-10-CM | POA: Diagnosis not present

## 2024-08-21 DIAGNOSIS — E559 Vitamin D deficiency, unspecified: Secondary | ICD-10-CM

## 2024-08-21 DIAGNOSIS — E7800 Pure hypercholesterolemia, unspecified: Secondary | ICD-10-CM | POA: Diagnosis not present

## 2024-08-21 LAB — CBC WITH DIFFERENTIAL/PLATELET
Basophils Absolute: 0 K/uL (ref 0.0–0.1)
Basophils Relative: 0.6 % (ref 0.0–3.0)
Eosinophils Absolute: 0.1 K/uL (ref 0.0–0.7)
Eosinophils Relative: 1.7 % (ref 0.0–5.0)
HCT: 42.1 % (ref 39.0–52.0)
Hemoglobin: 13.9 g/dL (ref 13.0–17.0)
Lymphocytes Relative: 23.6 % (ref 12.0–46.0)
Lymphs Abs: 1.3 K/uL (ref 0.7–4.0)
MCHC: 33.1 g/dL (ref 30.0–36.0)
MCV: 90.7 fl (ref 78.0–100.0)
Monocytes Absolute: 0.5 K/uL (ref 0.1–1.0)
Monocytes Relative: 9.4 % (ref 3.0–12.0)
Neutro Abs: 3.6 K/uL (ref 1.4–7.7)
Neutrophils Relative %: 64.7 % (ref 43.0–77.0)
Platelets: 216 K/uL (ref 150.0–400.0)
RBC: 4.64 Mil/uL (ref 4.22–5.81)
RDW: 14.1 % (ref 11.5–15.5)
WBC: 5.6 K/uL (ref 4.0–10.5)

## 2024-08-21 LAB — HEPATIC FUNCTION PANEL
ALT: 24 U/L (ref 0–53)
AST: 33 U/L (ref 0–37)
Albumin: 4.3 g/dL (ref 3.5–5.2)
Alkaline Phosphatase: 52 U/L (ref 39–117)
Bilirubin, Direct: 0.1 mg/dL (ref 0.0–0.3)
Total Bilirubin: 0.6 mg/dL (ref 0.2–1.2)
Total Protein: 6.7 g/dL (ref 6.0–8.3)

## 2024-08-21 LAB — URINALYSIS, ROUTINE W REFLEX MICROSCOPIC
Bilirubin Urine: NEGATIVE
Hgb urine dipstick: NEGATIVE
Ketones, ur: NEGATIVE
Leukocytes,Ua: NEGATIVE
Nitrite: NEGATIVE
RBC / HPF: NONE SEEN (ref 0–?)
Specific Gravity, Urine: 1.025 (ref 1.000–1.030)
Total Protein, Urine: NEGATIVE
Urine Glucose: NEGATIVE
Urobilinogen, UA: 0.2 (ref 0.0–1.0)
pH: 6 (ref 5.0–8.0)

## 2024-08-21 LAB — BASIC METABOLIC PANEL WITH GFR
BUN: 19 mg/dL (ref 6–23)
CO2: 29 meq/L (ref 19–32)
Calcium: 9.5 mg/dL (ref 8.4–10.5)
Chloride: 105 meq/L (ref 96–112)
Creatinine, Ser: 0.86 mg/dL (ref 0.40–1.50)
GFR: 78.91 mL/min (ref >60.00)
Glucose, Bld: 87 mg/dL (ref 70–99)
Potassium: 4.1 meq/L (ref 3.5–5.1)
Sodium: 141 meq/L (ref 135–145)

## 2024-08-21 LAB — LIPID PANEL
Cholesterol: 157 mg/dL (ref 0–200)
HDL: 48.2 mg/dL (ref >39.00)
LDL Cholesterol: 91 mg/dL (ref 0–99)
NonHDL: 108.98
Total CHOL/HDL Ratio: 3
Triglycerides: 88 mg/dL (ref 0.0–149.0)
VLDL: 17.6 mg/dL (ref 0.0–40.0)

## 2024-08-21 LAB — TSH: TSH: 2.07 u[IU]/mL (ref 0.35–5.50)

## 2024-08-21 LAB — VITAMIN B12: Vitamin B-12: 988 pg/mL — ABNORMAL HIGH (ref 211–911)

## 2024-08-21 LAB — VITAMIN D 25 HYDROXY (VIT D DEFICIENCY, FRACTURES): VITD: 38.21 ng/mL (ref 30.00–100.00)

## 2024-08-22 ENCOUNTER — Encounter: Payer: Self-pay | Admitting: Internal Medicine

## 2024-08-22 ENCOUNTER — Ambulatory Visit (INDEPENDENT_AMBULATORY_CARE_PROVIDER_SITE_OTHER): Admitting: Internal Medicine

## 2024-08-22 VITALS — BP 124/72 | HR 67 | Temp 97.9°F | Ht 69.0 in | Wt 170.6 lb

## 2024-08-22 DIAGNOSIS — E538 Deficiency of other specified B group vitamins: Secondary | ICD-10-CM

## 2024-08-22 DIAGNOSIS — E7800 Pure hypercholesterolemia, unspecified: Secondary | ICD-10-CM | POA: Diagnosis not present

## 2024-08-22 DIAGNOSIS — M1712 Unilateral primary osteoarthritis, left knee: Secondary | ICD-10-CM

## 2024-08-22 DIAGNOSIS — Z23 Encounter for immunization: Secondary | ICD-10-CM

## 2024-08-22 DIAGNOSIS — R739 Hyperglycemia, unspecified: Secondary | ICD-10-CM

## 2024-08-22 DIAGNOSIS — E559 Vitamin D deficiency, unspecified: Secondary | ICD-10-CM | POA: Diagnosis not present

## 2024-08-22 DIAGNOSIS — Z0001 Encounter for general adult medical examination with abnormal findings: Secondary | ICD-10-CM

## 2024-08-22 NOTE — Assessment & Plan Note (Signed)
 Lab Results  Component Value Date   LDLCALC 91 08/21/2024   Stable, pt to continue current statin  - diet, wt control, declines statin for now

## 2024-08-22 NOTE — Assessment & Plan Note (Signed)
 Severe, with recent fall, but pt cont's to feel he is able to avoid surgury for now, declines PT

## 2024-08-22 NOTE — Progress Notes (Signed)
 Patient ID: Alexander Castillo, male   DOB: 1941-07-31, 83 y.o.   MRN: [id number]         Chief Complaint:: wellness exam and low vit d, left knee DJD with recent fall, low b12 hld       HPI:  Alexander Castillo is a 83 y.o. male here for wellness exam; due for flu shot; o/w up to date                        Also fell sept 2 with knee giveaway, has end stage DJD recommended for surgury but has been holding off TKR as he has fishing to do.  Works in [employer] to [employer] daily for 2 hrs.  Pt denies chest pain, increased sob or doe, wheezing, orthopnea, PND, increased LE swelling, palpitations, dizziness or syncope.   Pt denies polydipsia, polyuria, or new focal neuro s/s.    Pt denies fever, wt loss, night sweats, loss of appetite, or other constitutional symptoms     Wt Readings from Last 3 Encounters:  08/22/24 170 lb 9.6 oz (77.4 kg)  02/22/24 174 lb (78.9 kg)  10/16/23 172 lb (78 kg)   BP Readings from Last 3 Encounters:  08/22/24 124/72  02/22/24 128/68  01/17/24 (!) 141/69   Immunization History  Administered Date(s) Administered   Fluad Quad(high Dose 65+) 08/18/2022   Fluad Trivalent(High Dose 65+) 08/21/2023   INFLUENZA, HIGH DOSE SEASONAL PF 08/26/2014, 09/08/2015, 08/23/2016, 09/18/2017, 08/30/2018, 09/12/2019, 08/13/2020, 08/22/2024   Influenza Whole 09/11/2008, 09/11/2009   Influenza,inj,Quad PF,6+ Mos 09/11/2014   Influenza-Unspecified 10/12/2018   PFIZER(Purple Top)SARS-COV-2 Vaccination 12/28/2019, 01/16/2020, 10/09/2020   Pneumococcal Conjugate-13 07/03/2014   Pneumococcal Polysaccharide-23 03/12/2004, 03/16/2009   Td 12/12/2004, 12/13/2007   Td (Adult), 2 Lf Tetanus Toxid, Preservative Free 12/12/2004, 12/13/2007   Tdap 07/16/2018   Zoster Recombinant(Shingrix) 06/08/2021   Zoster, Live 06/18/2012   Health Maintenance Due  Topic Date Due   Medicare Annual Wellness (AWV)  07/12/2024      Past Medical History:  Diagnosis Date   BENIGN PROSTATIC HYPERTROPHY  03/10/2008   BENIGN PROSTATIC HYPERTROPHY, HX OF 03/10/2008   CHEST PAIN 03/10/2008   at pre-op 01-17-18: per patient had cardiac eval at that time was sent for test (uaware type) ; reports nothing was abnormal; no recurrence of chest pain    DEGENERATIVE JOINT DISEASE 03/10/2008   EAR PAIN, LEFT 12/09/2008   HYPERLIPIDEMIA 03/10/2008   Osteoarthritis    left ankle   PSA, INCREASED 03/16/2009   RASH-NONVESICULAR 03/16/2009   Past Surgical History:  Procedure Laterality Date   ACHILLES TENDON LENGTHENING Left 12/31/2020   Procedure: Percutaneous Achilles Tendon Lengthening;  Surgeon: Kit Rush, MD;  Location: [facility];  Service: Orthopedics;  Laterality: Left;   back surgury  1998   lower   EYE SURGERY Bilateral    lens replacement for cataracts   hip replacement left Left 2002   JOINT REPLACEMENT Right    knee 2019, left hip 2017   s/p left knee arthroscopy  yrs ago   TOTAL ANKLE ARTHROPLASTY Left 12/31/2020   Procedure: Left total ankle replacement;  Surgeon: Kit Rush, MD;  Location: [facility];  Service: Orthopedics;  Laterality: Left;   TOTAL HIP ARTHROPLASTY Right 06/13/2016   Procedure: RIGHT TOTAL HIP ARTHROPLASTY ANTERIOR APPROACH;  Surgeon: Donnice Car, MD;  Location: [facility];  Service: Orthopedics;  Laterality: Right;  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   TOTAL KNEE ARTHROPLASTY Right 01/23/2018  Procedure: RIGHT TOTAL KNEE ARTHROPLASTY;  Surgeon: Ernie Cough, MD;  Location: [facility];  Service: Orthopedics;  Laterality: Right;  70 mins   transurethral resection of prostate      reports that he has never smoked. He has quit using smokeless tobacco. He reports that he does not drink alcohol and does not use drugs. family history includes Dementia in his mother. No Known Allergies Current Outpatient Medications on File Prior to Visit  Medication Sig Dispense Refill   desloratadine  (CLARINEX ) 5 MG tablet Take 1 tablet (5 mg total) by mouth daily. 90 tablet 3    azelastine  (ASTELIN ) 0.1 % nasal spray Place 2 sprays into both nostrils 2 (two) times daily. Use in each nostril as directed (Patient not taking: Reported on 08/22/2024) 30 mL 12   sodium chloride  (OCEAN) 0.65 % SOLN nasal spray Place 1 spray into both nostrils as needed. (Patient not taking: Reported on 08/22/2024) 30 mL 5   No current facility-administered medications on file prior to visit.        ROS:  All others reviewed and negative.  Objective        PE:  BP 124/72   Pulse 67   Temp 97.9 F (36.6 C)   Ht 5' 9 (1.753 m)   Wt 170 lb 9.6 oz (77.4 kg)   SpO2 96%   BMI 25.19 kg/m                 Constitutional: Pt appears in NAD               HENT: Head: NCAT.                Right Ear: External ear normal.                 Left Ear: External ear normal.                Eyes: . Pupils are equal, round, and reactive to light. Conjunctivae and EOM are normal               Nose: without d/c or deformity               Neck: Neck supple. Gross normal ROM               Cardiovascular: Normal rate and regular rhythm.                 Pulmonary/Chest: Effort normal and breath sounds without rales or wheezing.                Abd:  Soft, NT, ND, + BS, no organomegaly               Neurological: Pt is alert. At baseline orientation, motor grossly intact               Skin: Skin is warm. No rashes, no other new lesions, LE edema - none; left knee with marked degenerative changes but no effusion, has only mildly reduced ROM               Psychiatric: Pt behavior is normal without agitation   Micro: none  Cardiac tracings I have personally interpreted today:  none  Pertinent Radiological findings (summarize): none   Lab Results  Component Value Date   WBC 5.6 08/21/2024   HGB 13.9 08/21/2024   HCT 42.1 08/21/2024   PLT 216.0 08/21/2024   GLUCOSE 87 08/21/2024   CHOL 157 08/21/2024  TRIG 88.0 08/21/2024   HDL 48.20 08/21/2024   LDLCALC 91 08/21/2024   ALT 24 08/21/2024   AST 33  08/21/2024   NA 141 08/21/2024   K 4.1 08/21/2024   CL 105 08/21/2024   CREATININE 0.86 08/21/2024   BUN 19 08/21/2024   CO2 29 08/21/2024   TSH 2.07 08/21/2024   PSA 2.07 08/11/2021   Assessment/Plan:  Alexander Castillo is a 83 y.o. White or Caucasian [1] male with  has a past medical history of BENIGN PROSTATIC HYPERTROPHY (03/10/2008), BENIGN PROSTATIC HYPERTROPHY, HX OF (03/10/2008), CHEST PAIN (03/10/2008), DEGENERATIVE JOINT DISEASE (03/10/2008), EAR PAIN, LEFT (12/09/2008), HYPERLIPIDEMIA (03/10/2008), Osteoarthritis, PSA, INCREASED (03/16/2009), and RASH-NONVESICULAR (03/16/2009).  Encounter for well adult exam with abnormal findings Age and sex appropriate education and counseling updated with regular exercise and diet Referrals for preventative services - none needed Immunizations addressed - for flu shot today Smoking counseling  - none needed Evidence for depression or other mood disorder - none significant Most recent labs reviewed. I have personally reviewed and have noted: 1) the patient's medical and social history 2) The patient's current medications and supplements 3) The patient's height, weight, and BMI have been recorded in the chart   B12 deficiency Lab Results  Component Value Date   VITAMINB12 988 (H) 08/21/2024   Stable, cont oral replacement - b12 1000 mcg qd   Hyperlipidemia Lab Results  Component Value Date   LDLCALC 91 08/21/2024   Stable, pt to continue current statin  - diet, wt control, declines statin for now   Arthritis of left knee Severe, with recent fall, but pt cont's to feel he is able to avoid surgury for now, declines PT  Vitamin D  deficiency Last vitamin D  Lab Results  Component Value Date   VD25OH 38.21 08/21/2024   Low, to start oral replacement  Followup: Return in about 1 year (around 08/22/2025).  Lynwood Rush, MD 08/22/2024 2:09 PM [facility]

## 2024-08-22 NOTE — Assessment & Plan Note (Signed)
 Last vitamin D  Lab Results  Component Value Date   VD25OH 38.21 08/21/2024   Low, to start oral replacement

## 2024-08-22 NOTE — Patient Instructions (Signed)
You had the flu shot today  Please continue all other medications as before, and refills have been done if requested.  Please have the pharmacy call with any other refills you may need.  Please continue your efforts at being more active, low cholesterol diet, and weight control.  You are otherwise up to date with prevention measures today.  Please keep your appointments with your specialists as you may have planned  Please make an Appointment to return for your 1 year visit, or sooner if needed, with Lab testing by Appointment as well, to be done about 3-5 days before at the FIRST FLOOR Lab (so this is for TWO appointments - please see the scheduling desk as you leave)

## 2024-08-22 NOTE — Assessment & Plan Note (Signed)
Age and sex appropriate education and counseling updated with regular exercise and diet Referrals for preventative services - none needed Immunizations addressed - for flu shot today Smoking counseling  - none needed Evidence for depression or other mood disorder - none significant Most recent labs reviewed. I have personally reviewed and have noted: 1) the patient's medical and social history 2) The patient's current medications and supplements 3) The patient's height, weight, and BMI have been recorded in the chart  

## 2024-08-22 NOTE — Assessment & Plan Note (Signed)
 Lab Results  Component Value Date   VITAMINB12 988 (H) 08/21/2024   Stable, cont oral replacement - b12 1000 mcg qd

## 2024-08-22 NOTE — Addendum Note (Signed)
 Addended by: NORLEEN LYNWOOD ORN on: 08/22/2024 02:11 PM   Modules accepted: Orders

## 2024-09-06 ENCOUNTER — Ambulatory Visit

## 2024-09-16 DIAGNOSIS — M542 Cervicalgia: Secondary | ICD-10-CM | POA: Diagnosis not present

## 2024-09-20 DIAGNOSIS — M542 Cervicalgia: Secondary | ICD-10-CM | POA: Diagnosis not present

## 2024-10-02 DIAGNOSIS — M4712 Other spondylosis with myelopathy, cervical region: Secondary | ICD-10-CM | POA: Diagnosis not present

## 2024-10-02 DIAGNOSIS — M542 Cervicalgia: Secondary | ICD-10-CM | POA: Diagnosis not present

## 2024-12-04 ENCOUNTER — Other Ambulatory Visit: Payer: Self-pay | Admitting: Neurological Surgery

## 2024-12-04 ENCOUNTER — Ambulatory Visit: Admitting: Podiatry

## 2024-12-04 DIAGNOSIS — Q828 Other specified congenital malformations of skin: Secondary | ICD-10-CM | POA: Diagnosis not present

## 2024-12-04 NOTE — Progress Notes (Signed)
"  °  Subjective:  Patient ID: Alexander Castillo, male    DOB: 05-10-1941,  MRN: [id number]  Chief Complaint  Patient presents with   Foot Ulcer    83 y.o. male presents with the above complaint.  Patient presents with right second digit porokeratotic lesion painful to touch he would like to get it evaluated he wants to make sure is not an ulceration.  He has had previous ulceration at the distal tip of the second toe.  He states overall is doing better no pain mild discomfort pain scale is 2 out of 10 dull aching nature   Review of Systems: Negative except as noted in the HPI. Denies N/V/F/Ch.  Past Medical History:  Diagnosis Date   BENIGN PROSTATIC HYPERTROPHY 03/10/2008   BENIGN PROSTATIC HYPERTROPHY, HX OF 03/10/2008   CHEST PAIN 03/10/2008   at pre-op 01-17-18: per patient had cardiac eval at that time was sent for test (uaware type) ; reports nothing was abnormal; no recurrence of chest pain    DEGENERATIVE JOINT DISEASE 03/10/2008   EAR PAIN, LEFT 12/09/2008   HYPERLIPIDEMIA 03/10/2008   Osteoarthritis    left ankle   PSA, INCREASED 03/16/2009   RASH-NONVESICULAR 03/16/2009   Current Medications[1]  Tobacco Use History[2]  Allergies[3] Objective:  There were no vitals filed for this visit. There is no height or weight on file to calculate BMI. Constitutional Well developed. Well nourished.  Vascular Dorsalis pedis pulses palpable bilaterally. Posterior tibial pulses palpable bilaterally. Capillary refill normal to all digits.  No cyanosis or clubbing noted. Pedal hair growth normal.  Neurologic Normal speech. Oriented to person, place, and time. Epicritic sensation to light touch grossly present bilaterally.  Dermatologic Right second digit porokeratotic lesion with central nucleated core noted.  No pinpoint bleeding noted upon debridement.  No open wounds or lesions noted  Orthopedic: Normal joint ROM without pain or crepitus bilaterally. No visible deformities. No bony  tenderness.   Radiographs: None Assessment:   1. Porokeratosis    Plan:  Patient was evaluated and treated and all questions answered.  Right second digit porokeratotic lesion - All questions and concerns were discussed with the patient in extensive detail using chisel blade and of the lesion was debride down to healthy stripe tissue no complication noted no pinpoint bleeding noted no underlying ulceration noted.  Encouraged shoe gear modification padding offloading he states understanding  No follow-ups on file.    [1]  Current Outpatient Medications:    azelastine  (ASTELIN ) 0.1 % nasal spray, Place 2 sprays into both nostrils 2 (two) times daily. Use in each nostril as directed (Patient not taking: Reported on 08/22/2024), Disp: 30 mL, Rfl: 12   desloratadine  (CLARINEX ) 5 MG tablet, Take 1 tablet (5 mg total) by mouth daily., Disp: 90 tablet, Rfl: 3   sodium chloride  (OCEAN) 0.65 % SOLN nasal spray, Place 1 spray into both nostrils as needed. (Patient not taking: Reported on 08/22/2024), Disp: 30 mL, Rfl: 5 [2]  Social History Tobacco Use  Smoking Status Never  Smokeless Tobacco Former  [3] No Known Allergies  "

## 2024-12-16 NOTE — Pre-Procedure Instructions (Signed)
 Surgical Instructions   Your procedure is scheduled on December 27, 2024. Report to [facility] Main Entrance A at 8:40 A.M., then check in with the Admitting office. Any questions or running late day of surgery: call [phone]  Questions prior to your surgery date: call [phone], Monday-Friday, 8am-4pm. If you experience any cold or flu symptoms such as cough, fever, chills, shortness of breath, etc. between now and your scheduled surgery, please notify us  at the above number.     Remember:  Do not eat or drink after midnight the night before your surgery    Take these medicines the morning of surgery with A SIP OF WATER : Gemtesa   May take these medicines IF NEEDED: none    One week prior to surgery, STOP taking any Aspirin  (unless otherwise instructed by your surgeon) Aleve, Naproxen, Ibuprofen, Motrin, Advil, Goody's, BC's, all herbal medications, fish oil, and non-prescription vitamins.                     Do NOT Smoke (Tobacco/Vaping) for 24 hours prior to your procedure.  If you use a CPAP at night, you may bring your mask/headgear for your overnight stay.   You will be asked to remove any contacts, glasses, piercing's, hearing aid's, dentures/partials prior to surgery. Please bring cases for these items if needed.    Patients discharged the day of surgery will not be allowed to drive home, and someone needs to stay with them for 24 hours.  SURGICAL WAITING ROOM VISITATION Patients may have no more than 2 support people in the waiting area - these visitors may rotate.   Pre-op nurse will coordinate an appropriate time for 1 ADULT support person, who may not rotate, to accompany patient in pre-op.  Children under the age of 61 must have an adult with them who is not the patient and must remain in the main waiting area with an adult.  If the patient needs to stay at the hospital during part of their recovery, the visitor guidelines for inpatient rooms  apply.  Please refer to the [facility] website for the visitor guidelines for any additional information.   If you received a COVID test during your pre-op visit  it is requested that you wear a mask when out in public, stay away from anyone that may not be feeling well and notify your surgeon if you develop symptoms. If you have been in contact with anyone that has tested positive in the last 10 days please notify you surgeon.      Pre-operative 4 CHG Bathing Instructions   You can play a key role in reducing the risk of infection after surgery. Your skin needs to be as free of germs as possible. You can reduce the number of germs on your skin by washing with CHG (chlorhexidine  gluconate) soap before surgery. CHG is an antiseptic soap that kills germs and continues to kill germs even after washing.   DO NOT use if you have an allergy to chlorhexidine /CHG or antibacterial soaps. If your skin becomes reddened or irritated, stop using the CHG and notify one of our RNs at [phone].   Please shower with the CHG soap starting 4 days before surgery using the following schedule:     Please keep in mind the following:  DO NOT shave, including legs and underarms, starting the day of your first shower.   You may shave your face at any point before/day of surgery.  Place clean sheets on  your bed the day you start using CHG soap. Use a clean washcloth (not used since being washed) for each shower. DO NOT sleep with pets once you start using the CHG.   CHG Shower Instructions:  Wash your face and private area with normal soap. If you choose to wash your hair, wash first with your normal shampoo.  After you use shampoo/soap, rinse your hair and body thoroughly to remove shampoo/soap residue.  Turn the water  OFF and apply  bottle of CHG soap to a CLEAN washcloth.  Apply CHG soap ONLY FROM YOUR NECK DOWN TO YOUR TOES (washing for 3-5 minutes)  DO NOT use CHG soap on face, private areas, open  wounds, or sores.  Pay special attention to the area where your surgery is being performed.  If you are having back surgery, having someone wash your back for you may be helpful. Wait 2 minutes after CHG soap is applied, then you may rinse off the CHG soap.  Pat dry with a clean towel  Put on clean clothes/pajamas   If you choose to wear lotion, please use ONLY the CHG-compatible lotions that are listed below.  Additional instructions for the day of surgery:  If you choose, you may shower the morning of surgery with an antibacterial soap.  DO NOT APPLY any lotions, deodorants, cologne, or perfumes.   Do not bring valuables to the hospital. [facility] is not responsible for any belongings/valuables. Do not wear nail polish, gel polish, artificial nails, or any other type of covering on natural nails (fingers and toes) Do not wear jewelry or makeup Put on clean/comfortable clothes.  Please brush your teeth.  Ask your nurse before applying any prescription medications to the skin.     CHG Compatible Lotions   Aveeno Moisturizing lotion  Cetaphil Moisturizing Cream  Cetaphil Moisturizing Lotion  Clairol Herbal Essence Moisturizing Lotion, Dry Skin  Clairol Herbal Essence Moisturizing Lotion, Extra Dry Skin  Clairol Herbal Essence Moisturizing Lotion, Normal Skin  Curel Age Defying Therapeutic Moisturizing Lotion with Alpha Hydroxy  Curel Extreme Care Body Lotion  Curel Soothing Hands Moisturizing Hand Lotion  Curel Therapeutic Moisturizing Cream, Fragrance-Free  Curel Therapeutic Moisturizing Lotion, Fragrance-Free  Curel Therapeutic Moisturizing Lotion, Original Formula  Eucerin Daily Replenishing Lotion  Eucerin Dry Skin Therapy Plus Alpha Hydroxy Crme  Eucerin Dry Skin Therapy Plus Alpha Hydroxy Lotion  Eucerin Original Crme  Eucerin Original Lotion  Eucerin Plus Crme Eucerin Plus Lotion  Eucerin TriLipid Replenishing Lotion  Keri Anti-Bacterial Hand Lotion  Keri Deep  Conditioning Original Lotion Dry Skin Formula Softly Scented  Keri Deep Conditioning Original Lotion, Fragrance Free Sensitive Skin Formula  Keri Lotion Fast Absorbing Fragrance Free Sensitive Skin Formula  Keri Lotion Fast Absorbing Softly Scented Dry Skin Formula  Keri Original Lotion  Keri Skin Renewal Lotion Keri Silky Smooth Lotion  Keri Silky Smooth Sensitive Skin Lotion  Nivea Body Creamy Conditioning Oil  Nivea Body Extra Enriched Lotion  Nivea Body Original Lotion  Nivea Body Sheer Moisturizing Lotion Nivea Crme  Nivea Skin Firming Lotion  NutraDerm 30 Skin Lotion  NutraDerm Skin Lotion  NutraDerm Therapeutic Skin Cream  NutraDerm Therapeutic Skin Lotion  ProShield Protective Hand Cream  Provon moisturizing lotion  Please read over the following fact sheets that you were given.

## 2024-12-17 ENCOUNTER — Encounter (HOSPITAL_COMMUNITY)
Admission: RE | Admit: 2024-12-17 | Discharge: 2024-12-17 | Disposition: A | Discharge status: Home / Self Care | Source: Ambulatory Visit | Attending: Neurological Surgery | Admitting: Neurological Surgery

## 2024-12-17 ENCOUNTER — Encounter (HOSPITAL_COMMUNITY): Payer: Self-pay

## 2024-12-17 ENCOUNTER — Other Ambulatory Visit: Payer: Self-pay

## 2024-12-17 VITALS — BP 165/73 | HR 92 | Temp 98.0°F | Resp 18 | Ht 67.0 in | Wt 175.6 lb

## 2024-12-17 DIAGNOSIS — Z01812 Encounter for preprocedural laboratory examination: Secondary | ICD-10-CM | POA: Insufficient documentation

## 2024-12-17 DIAGNOSIS — Z01818 Encounter for other preprocedural examination: Secondary | ICD-10-CM

## 2024-12-17 LAB — CBC
HCT: 41.7 % (ref 39.0–52.0)
Hemoglobin: 13.6 g/dL (ref 13.0–17.0)
MCH: 30.8 pg (ref 26.0–34.0)
MCHC: 32.6 g/dL (ref 30.0–36.0)
MCV: 94.6 fL (ref 80.0–100.0)
Platelets: 195 K/uL (ref 150–400)
RBC: 4.41 MIL/uL (ref 4.22–5.81)
RDW: 13.5 % (ref 11.5–15.5)
WBC: 5.4 K/uL (ref 4.0–10.5)
nRBC: 0 % (ref 0.0–0.2)

## 2024-12-17 LAB — SURGICAL PCR SCREEN
MRSA, PCR: NEGATIVE
Staphylococcus aureus: NEGATIVE

## 2024-12-17 NOTE — Progress Notes (Signed)
 PCP - Lynwood Norleen COME Cardiologist - denies  PPM/ICD - denies Device Orders -  Rep Notified -   Chest x-ray - na EKG - na Stress Test - denies ECHO - denies Cardiac Cath - denies  Sleep Study - denies CPAP -   Fasting Blood Sugar - na Checks Blood Sugar _____ times a day  Last dose of GLP1 agonist-  na GLP1 instructions: na  Blood Thinner Instructions:na Aspirin  Instructions:na  ERAS Protcol -NPO PRE-SURGERY Ensure or G2-   COVID TEST- NA   Anesthesia review: no  Patient denies shortness of breath, fever, cough and chest pain at PAT appointment   All instructions explained to the patient, with a verbal understanding of the material. Patient agrees to go over the instructions while at home for a better understanding. The opportunity to ask questions was provided.

## 2024-12-26 NOTE — Progress Notes (Signed)
 Spoke with patient about surgery time change to 0945. Patient instructed to arrive by 0745 and not to eat or drink after midnight tonight. Patient voiced understanding.

## 2024-12-27 ENCOUNTER — Observation Stay (HOSPITAL_COMMUNITY)
Admission: RE | Admit: 2024-12-27 | Discharge: 2024-12-28 | Disposition: A | Discharge status: Home / Self Care | Attending: Neurological Surgery | Admitting: Neurological Surgery

## 2024-12-27 ENCOUNTER — Encounter (HOSPITAL_COMMUNITY): Payer: Self-pay | Admitting: Neurological Surgery

## 2024-12-27 ENCOUNTER — Ambulatory Visit (HOSPITAL_COMMUNITY): Admitting: Anesthesiology

## 2024-12-27 ENCOUNTER — Ambulatory Visit (HOSPITAL_COMMUNITY)

## 2024-12-27 ENCOUNTER — Encounter (HOSPITAL_COMMUNITY)
Admission: RE | Disposition: A | Discharge status: Home / Self Care | Payer: Self-pay | Source: Home / Self Care | Attending: Neurological Surgery

## 2024-12-27 DIAGNOSIS — M542 Cervicalgia: Secondary | ICD-10-CM | POA: Diagnosis present

## 2024-12-27 DIAGNOSIS — M4712 Other spondylosis with myelopathy, cervical region: Secondary | ICD-10-CM | POA: Diagnosis not present

## 2024-12-27 DIAGNOSIS — Z96651 Presence of right artificial knee joint: Secondary | ICD-10-CM | POA: Insufficient documentation

## 2024-12-27 DIAGNOSIS — M4802 Spinal stenosis, cervical region: Secondary | ICD-10-CM | POA: Diagnosis not present

## 2024-12-27 DIAGNOSIS — Z981 Arthrodesis status: Principal | ICD-10-CM

## 2024-12-27 DIAGNOSIS — Z96662 Presence of left artificial ankle joint: Secondary | ICD-10-CM | POA: Insufficient documentation

## 2024-12-27 DIAGNOSIS — Z96641 Presence of right artificial hip joint: Secondary | ICD-10-CM | POA: Diagnosis not present

## 2024-12-27 MED ORDER — SUGAMMADEX SODIUM 200 MG/2ML IV SOLN
INTRAVENOUS | Status: DC | PRN
  Administered 2024-12-27: 200 mg via INTRAVENOUS

## 2024-12-27 MED ORDER — BUPIVACAINE HCL (PF) 0.25 % IJ SOLN
INTRAMUSCULAR | Status: AC
  Filled 2024-12-27: qty 30

## 2024-12-27 MED ORDER — CHLORHEXIDINE GLUCONATE CLOTH 2 % EX PADS: 6.0000 | MEDICATED_PAD | Freq: Once | CUTANEOUS | Status: DC

## 2024-12-27 MED ORDER — ORAL CARE MOUTH RINSE: 15.0000 mL | Freq: Once | OROMUCOSAL | Status: AC

## 2024-12-27 MED ORDER — LIDOCAINE 2% (20 MG/ML) 5 ML SYRINGE
INTRAMUSCULAR | Status: DC | PRN
  Administered 2024-12-27 (×2): 40 mg via INTRAVENOUS

## 2024-12-27 MED ORDER — FENTANYL CITRATE (PF) 250 MCG/5ML IJ SOLN
INTRAMUSCULAR | Status: AC
  Filled 2024-12-27: qty 5

## 2024-12-27 MED ORDER — POTASSIUM CHLORIDE IN NACL 20-0.9 MEQ/L-% IV SOLN
INTRAVENOUS | Status: DC
  Filled 2024-12-27: qty 1000

## 2024-12-27 MED ORDER — LACTATED RINGERS IV SOLN: INTRAVENOUS | Status: DC

## 2024-12-27 MED ORDER — DEXAMETHASONE SOD PHOSPHATE PF 10 MG/ML IJ SOLN
INTRAMUSCULAR | Status: DC | PRN
  Administered 2024-12-27: 10 mg via INTRAVENOUS

## 2024-12-27 MED ORDER — HYDROCODONE-ACETAMINOPHEN 5-325 MG PO TABS
1.0000 | ORAL_TABLET | ORAL | Status: DC | PRN
  Administered 2024-12-27: 1 via ORAL
  Filled 2024-12-27: qty 1

## 2024-12-27 MED ORDER — ONDANSETRON HCL 4 MG/2ML IJ SOLN
INTRAMUSCULAR | Status: AC
  Filled 2024-12-27: qty 2

## 2024-12-27 MED ORDER — PROPOFOL 10 MG/ML IV BOLUS
INTRAVENOUS | Status: AC
  Filled 2024-12-27: qty 20

## 2024-12-27 MED ORDER — ROCURONIUM BROMIDE 10 MG/ML (PF) SYRINGE
PREFILLED_SYRINGE | INTRAVENOUS | Status: AC
  Filled 2024-12-27: qty 10

## 2024-12-27 MED ORDER — ONDANSETRON HCL 4 MG/2ML IJ SOLN: 4.0000 mg | Freq: Four times a day (QID) | INTRAMUSCULAR | Status: DC | PRN

## 2024-12-27 MED ORDER — METHOCARBAMOL 1000 MG/10ML IJ SOLN: 500.0000 mg | Freq: Four times a day (QID) | INTRAMUSCULAR | Status: DC | PRN

## 2024-12-27 MED ORDER — BUPIVACAINE HCL (PF) 0.5 % IJ SOLN
INTRAMUSCULAR | Status: AC
  Filled 2024-12-27: qty 30

## 2024-12-27 MED ORDER — SENNA 8.6 MG PO TABS
1.0000 | ORAL_TABLET | Freq: Two times a day (BID) | ORAL | Status: DC
  Administered 2024-12-27 (×2): 8.6 mg via ORAL
  Filled 2024-12-27 (×3): qty 1

## 2024-12-27 MED ORDER — FENTANYL CITRATE (PF) 100 MCG/2ML IJ SOLN
INTRAMUSCULAR | Status: AC
  Filled 2024-12-27: qty 2

## 2024-12-27 MED ORDER — DEXAMETHASONE SOD PHOSPHATE PF 10 MG/ML IJ SOLN
INTRAMUSCULAR | Status: AC
  Filled 2024-12-27: qty 1

## 2024-12-27 MED ORDER — SODIUM CHLORIDE 0.9 % IV SOLN: 250.0000 mL | INTRAVENOUS | Status: DC

## 2024-12-27 MED ORDER — ROCURONIUM BROMIDE 10 MG/ML (PF) SYRINGE
PREFILLED_SYRINGE | INTRAVENOUS | Status: DC | PRN
  Administered 2024-12-27: 30 mg via INTRAVENOUS
  Administered 2024-12-27: 70 mg via INTRAVENOUS

## 2024-12-27 MED ORDER — SODIUM CHLORIDE 0.9% FLUSH: 3.0000 mL | INTRAVENOUS | Status: DC | PRN

## 2024-12-27 MED ORDER — MIRABEGRON ER 25 MG PO TB24
25.0000 mg | ORAL_TABLET | Freq: Every day | ORAL | Status: DC
  Administered 2024-12-28: 25 mg via ORAL
  Filled 2024-12-27: qty 1

## 2024-12-27 MED ORDER — DEXAMETHASONE SODIUM PHOSPHATE 4 MG/ML IJ SOLN
4.0000 mg | Freq: Four times a day (QID) | INTRAMUSCULAR | Status: DC
  Administered 2024-12-27 – 2024-12-28 (×2): 4 mg via INTRAVENOUS
  Filled 2024-12-27 (×2): qty 1

## 2024-12-27 MED ORDER — THROMBIN 5000 UNITS EX KIT
PACK | CUTANEOUS | Status: AC
  Filled 2024-12-27: qty 1

## 2024-12-27 MED ORDER — THROMBIN 5000 UNITS EX SOLR
OROMUCOSAL | Status: DC | PRN
  Administered 2024-12-27: 5 mL via TOPICAL

## 2024-12-27 MED ORDER — CEFAZOLIN SODIUM-DEXTROSE 2-4 GM/100ML-% IV SOLN
2.0000 g | INTRAVENOUS | Status: AC
  Administered 2024-12-27: 2 g via INTRAVENOUS
  Filled 2024-12-27: qty 100

## 2024-12-27 MED ORDER — PHENOL 1.4 % MT LIQD: 1.0000 | OROMUCOSAL | Status: DC | PRN

## 2024-12-27 MED ORDER — METHOCARBAMOL 500 MG PO TABS
500.0000 mg | ORAL_TABLET | Freq: Four times a day (QID) | ORAL | Status: DC | PRN
  Filled 2024-12-27: qty 1

## 2024-12-27 MED ORDER — PROPOFOL 10 MG/ML IV BOLUS
INTRAVENOUS | Status: DC | PRN
  Administered 2024-12-27: 110 mg via INTRAVENOUS

## 2024-12-27 MED ORDER — ACETAMINOPHEN 325 MG PO TABS
650.0000 mg | ORAL_TABLET | ORAL | Status: DC | PRN
  Administered 2024-12-27: 650 mg via ORAL
  Filled 2024-12-27: qty 2

## 2024-12-27 MED ORDER — 0.9 % SODIUM CHLORIDE (POUR BTL) OPTIME
TOPICAL | Status: DC | PRN
  Administered 2024-12-27: 1000 mL

## 2024-12-27 MED ORDER — PHENYLEPHRINE HCL-NACL 20-0.9 MG/250ML-% IV SOLN
INTRAVENOUS | Status: DC | PRN
  Administered 2024-12-27: 25 ug/min via INTRAVENOUS

## 2024-12-27 MED ORDER — ONDANSETRON HCL 4 MG PO TABS: 4.0000 mg | ORAL_TABLET | Freq: Four times a day (QID) | ORAL | Status: DC | PRN

## 2024-12-27 MED ORDER — OXYCODONE HCL 5 MG PO TABS: 5.0000 mg | ORAL_TABLET | Freq: Once | ORAL | Status: DC | PRN

## 2024-12-27 MED ORDER — CHLORHEXIDINE GLUCONATE 0.12 % MT SOLN
15.0000 mL | Freq: Once | OROMUCOSAL | Status: AC
  Administered 2024-12-27: 15 mL via OROMUCOSAL
  Filled 2024-12-27: qty 15

## 2024-12-27 MED ORDER — EPHEDRINE 5 MG/ML INJ
INTRAVENOUS | Status: AC
  Filled 2024-12-27: qty 5

## 2024-12-27 MED ORDER — GLYCOPYRROLATE PF 0.2 MG/ML IJ SOSY
PREFILLED_SYRINGE | INTRAMUSCULAR | Status: AC
  Filled 2024-12-27: qty 2

## 2024-12-27 MED ORDER — ONDANSETRON HCL 4 MG/2ML IJ SOLN
INTRAMUSCULAR | Status: DC | PRN
  Administered 2024-12-27: 4 mg via INTRAVENOUS

## 2024-12-27 MED ORDER — FENTANYL CITRATE (PF) 100 MCG/2ML IJ SOLN
25.0000 ug | INTRAMUSCULAR | Status: DC | PRN
  Administered 2024-12-27 (×2): 25 ug via INTRAVENOUS

## 2024-12-27 MED ORDER — GABAPENTIN 300 MG PO CAPS
300.0000 mg | ORAL_CAPSULE | ORAL | Status: AC
  Administered 2024-12-27: 300 mg via ORAL
  Filled 2024-12-27: qty 1

## 2024-12-27 MED ORDER — ACETAMINOPHEN 650 MG RE SUPP: 650.0000 mg | RECTAL | Status: DC | PRN

## 2024-12-27 MED ORDER — LIDOCAINE 2% (20 MG/ML) 5 ML SYRINGE
INTRAMUSCULAR | Status: AC
  Filled 2024-12-27: qty 5

## 2024-12-27 MED ORDER — OXYCODONE HCL 5 MG/5ML PO SOLN: 5.0000 mg | Freq: Once | ORAL | Status: DC | PRN

## 2024-12-27 MED ORDER — ACETAMINOPHEN 500 MG PO TABS
1000.0000 mg | ORAL_TABLET | ORAL | Status: AC
  Administered 2024-12-27: 1000 mg via ORAL
  Filled 2024-12-27: qty 2

## 2024-12-27 MED ORDER — CHLORHEXIDINE GLUCONATE CLOTH 2 % EX PADS
6.0000 | MEDICATED_PAD | Freq: Once | CUTANEOUS | Status: AC
  Administered 2024-12-27: 6 via TOPICAL

## 2024-12-27 MED ORDER — CEFAZOLIN SODIUM-DEXTROSE 2-4 GM/100ML-% IV SOLN
2.0000 g | Freq: Three times a day (TID) | INTRAVENOUS | Status: AC
  Administered 2024-12-27 – 2024-12-28 (×2): 2 g via INTRAVENOUS
  Filled 2024-12-27 (×2): qty 100

## 2024-12-27 MED ORDER — DEXAMETHASONE 4 MG PO TABS
4.0000 mg | ORAL_TABLET | Freq: Four times a day (QID) | ORAL | Status: DC
  Administered 2024-12-28: 4 mg via ORAL
  Filled 2024-12-27: qty 1

## 2024-12-27 MED ORDER — MENTHOL 3 MG MT LOZG: 1.0000 | LOZENGE | OROMUCOSAL | Status: DC | PRN

## 2024-12-27 MED ORDER — GLYCOPYRROLATE PF 0.2 MG/ML IJ SOSY
PREFILLED_SYRINGE | INTRAMUSCULAR | Status: DC | PRN
  Administered 2024-12-27 (×2): .1 mg via INTRAVENOUS

## 2024-12-27 MED ORDER — EPHEDRINE SULFATE-NACL 50-0.9 MG/10ML-% IV SOSY
PREFILLED_SYRINGE | INTRAVENOUS | Status: DC | PRN
  Administered 2024-12-27: 5 mg via INTRAVENOUS

## 2024-12-27 MED ORDER — BUPIVACAINE HCL (PF) 0.25 % IJ SOLN
INTRAMUSCULAR | Status: DC | PRN
  Administered 2024-12-27: 4 mL

## 2024-12-27 MED ORDER — FENTANYL CITRATE (PF) 250 MCG/5ML IJ SOLN
INTRAMUSCULAR | Status: DC | PRN
  Administered 2024-12-27: 50 ug via INTRAVENOUS

## 2024-12-27 MED ORDER — MORPHINE SULFATE (PF) 2 MG/ML IV SOLN: 2.0000 mg | INTRAVENOUS | Status: DC | PRN

## 2024-12-27 MED ORDER — ACETAMINOPHEN 10 MG/ML IV SOLN: 1000.0000 mg | Freq: Once | INTRAVENOUS | Status: DC | PRN

## 2024-12-27 MED ORDER — SODIUM CHLORIDE 0.9% FLUSH
3.0000 mL | Freq: Two times a day (BID) | INTRAVENOUS | Status: DC
  Administered 2024-12-27 (×2): 3 mL via INTRAVENOUS

## 2024-12-27 NOTE — Plan of Care (Signed)
°  Problem: Education: Goal: Knowledge of General Education information will improve Description: Including pain rating scale, medication(s)/side effects and non-pharmacologic comfort measures Outcome: Progressing   Problem: Clinical Measurements: Goal: Ability to maintain clinical measurements within normal limits will improve Outcome: Progressing Goal: Will remain free from infection Outcome: Progressing Goal: Diagnostic test results will improve Outcome: Progressing Goal: Respiratory complications will improve Outcome: Progressing Goal: Cardiovascular complication will be avoided Outcome: Progressing   Problem: Nutrition: Goal: Adequate nutrition will be maintained Outcome: Progressing   Problem: Pain Managment: Goal: General experience of comfort will improve and/or be controlled Outcome: Progressing

## 2024-12-27 NOTE — H&P (Signed)
 " Subjective:   Patient is a 84 y.o. male admitted for neck pain and numb hands and poor balance. The patient first presented to me with complaints of neck pain and numbness of the arm(s). Onset of symptoms was several months ago. The pain is described as aching and dull and occurs all day. The pain is rated moderate, and is located in the neck and radiates to the shoulders. The symptoms have been progressive. Symptoms are exacerbated by extending head backwards, and are relieved by none.  Previous work up includes MRI of cervical spine, results: spinal stenosis.  Past Medical History:  Diagnosis Date   BENIGN PROSTATIC HYPERTROPHY 03/10/2008   BENIGN PROSTATIC HYPERTROPHY, HX OF 03/10/2008   CHEST PAIN 03/10/2008   at pre-op 01-17-18: per patient had cardiac eval at that time was sent for test (uaware type) ; reports nothing was abnormal; no recurrence of chest pain    DEGENERATIVE JOINT DISEASE 03/10/2008   EAR PAIN, LEFT 12/09/2008   HYPERLIPIDEMIA 03/10/2008   Osteoarthritis    left ankle   PSA, INCREASED 03/16/2009   RASH-NONVESICULAR 03/16/2009    Past Surgical History:  Procedure Laterality Date   ACHILLES TENDON LENGTHENING Left 12/31/2020   Procedure: Percutaneous Achilles Tendon Lengthening;  Surgeon: Kit Rush, MD;  Location: [facility];  Service: Orthopedics;  Laterality: Left;   back surgury  1998   lower   EYE SURGERY Bilateral    lens replacement for cataracts   hip replacement left Left 2002   JOINT REPLACEMENT Right    knee 2019, left hip 2017   s/p left knee arthroscopy  yrs ago   TONSILLECTOMY     TOTAL ANKLE ARTHROPLASTY Left 12/31/2020   Procedure: Left total ankle replacement;  Surgeon: Kit Rush, MD;  Location: [facility];  Service: Orthopedics;  Laterality: Left;   TOTAL HIP ARTHROPLASTY Right 06/13/2016   Procedure: RIGHT TOTAL HIP ARTHROPLASTY ANTERIOR APPROACH;  Surgeon: Donnice Car, MD;  Location: [facility];  Service: Orthopedics;   Laterality: Right;  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   TOTAL KNEE ARTHROPLASTY Right 01/23/2018   Procedure: RIGHT TOTAL KNEE ARTHROPLASTY;  Surgeon: Car Donnice, MD;  Location: [facility];  Service: Orthopedics;  Laterality: Right;  70 mins   transurethral resection of prostate      Allergies[1]  Social History   Tobacco Use   Smoking status: Never   Smokeless tobacco: Former  Substance Use Topics   Alcohol use: No    Family History  Problem Relation Age of Onset   Dementia Mother    Prior to Admission medications  Medication Sig Start Date End Date Taking? Authorizing Provider  GEMTESA 75 MG TABS Take 75 mg by mouth daily.   Yes [provider]  azelastine  (ASTELIN ) 0.1 % nasal spray Place 2 sprays into both nostrils 2 (two) times daily. Use in each nostril as directed Patient not taking: Reported on 02/22/2024 10/16/23   Soldatova, Liuba, MD  desloratadine  (CLARINEX ) 5 MG tablet Take 1 tablet (5 mg total) by mouth daily. Patient not taking: Reported on 12/13/2024 01/17/24   Soldatova, Liuba, MD  sodium chloride  (OCEAN) 0.65 % SOLN nasal spray Place 1 spray into both nostrils as needed. Patient not taking: Reported on 02/22/2024 10/16/23   Soldatova, Liuba, MD     Review of Systems  Positive ROS: neg  All other systems have been reviewed and were otherwise negative with the exception of those mentioned in the HPI and as above.  Objective: Vital  signs in last 24 hours: Temp:  [98.5 F (36.9 C)] 98.5 F (36.9 C) (01/16 0756) Pulse Rate:  [81] 81 (01/16 0756) Resp:  [18] 18 (01/16 0756) BP: (155)/(77) 155/77 (01/16 0756) SpO2:  [95 %] 95 % (01/16 0756) Weight:  [77.1 kg] 77.1 kg (01/16 0756)  General Appearance: Alert, cooperative, no distress, appears stated age Head: Normocephalic, without obvious abnormality, atraumatic Eyes: PERRL, conjunctiva/corneas clear, EOM's intact      Neck: Supple, symmetrical, trachea midline, Back: Symmetric, no curvature, ROM normal, no CVA  tenderness Lungs:  respirations unlabored Heart: Regular rate and rhythm Abdomen: Soft, non-tender Extremities: Extremities normal, atraumatic, no cyanosis or edema Pulses: 2+ and symmetric all extremities Skin: Skin color, texture, turgor normal, no rashes or lesions  NEUROLOGIC:  Mental status: Alert and oriented x4, no aphasia, good attention span, fund of knowledge and memory  Motor Exam - grossly normal Sensory Exam - grossly normal Reflexes: 3+ Coordination - grossly normal Gait - not tested Balance - not tested  Cranial Nerves: I: smell Not tested  II: visual acuity  OS: nl    OD: nl  II: visual fields Full to confrontation  II: pupils Equal, round, reactive to light  III,VII: ptosis None  III,IV,VI: extraocular muscles  Full ROM  V: mastication Normal  V: facial light touch sensation  Normal  V,VII: corneal reflex  Present  VII: facial muscle function - upper  Normal  VII: facial muscle function - lower Normal  VIII: hearing Not tested  IX: soft palate elevation  Normal  IX,X: gag reflex Present  XI: trapezius strength  5/5  XI: sternocleidomastoid strength 5/5  XI: neck flexion strength  5/5  XII: tongue strength  Normal    Data Review Lab Results  Component Value Date   WBC 5.4 12/17/2024   HGB 13.6 12/17/2024   HCT 41.7 12/17/2024   MCV 94.6 12/17/2024   PLT 195 12/17/2024   Lab Results  Component Value Date   NA 141 08/21/2024   K 4.1 08/21/2024   CL 105 08/21/2024   CO2 29 08/21/2024   BUN 19 08/21/2024   CREATININE 0.86 08/21/2024   GLUCOSE 87 08/21/2024   No results found for: INR, PROTIME  Assessment:   Cervical neck pain with herniated nucleus pulposus/ spondylosis/ stenosis at C3-4 C4-5 with myelopathy. Estimated body mass index is 26.63 kg/m as calculated from the following:   Height as of this encounter: 5' 7 (1.702 m).   Weight as of this encounter: 77.1 kg.  Patient has failed conservative therapy. Planned surgery : ACDF with  plate R6-5 R5-4  Plan:   I explained the condition and procedure to the patient and answered any questions.  Patient wishes to proceed with procedure as planned. Understands risks/ benefits/ and expected or typical outcomes.  Alm GORMAN Molt 12/27/2024 9:24 AM     [1] No Known Allergies  "

## 2024-12-27 NOTE — Anesthesia Procedure Notes (Signed)
 Procedure Name: Intubation Date/Time: 12/27/2024 10:02 AM  Performed by: Vertie Russell NOVAK, CRNAPre-anesthesia Checklist: Patient identified, Emergency Drugs available, Suction available and Patient being monitored Patient Re-evaluated:Patient Re-evaluated prior to induction Oxygen Delivery Method: Circle System Utilized Preoxygenation: Pre-oxygenation with 100% oxygen Induction Type: IV induction Ventilation: Mask ventilation without difficulty Laryngoscope Size: Glidescope and 4 Grade View: Grade I Tube type: Oral Tube size: 7.5 mm Number of attempts: 1 Airway Equipment and Method: Stylet Placement Confirmation: ETT inserted through vocal cords under direct vision, positive ETCO2 and breath sounds checked- equal and bilateral Secured at: 21 cm Tube secured with: Tape Dental Injury: Teeth and Oropharynx as per pre-operative assessment  Comments: Elective use of video laryngoscopy due to limited neck range of motion

## 2024-12-27 NOTE — Anesthesia Preprocedure Evaluation (Signed)
 "                                  Anesthesia Evaluation  Patient identified by MRN, date of birth, ID band Patient awake    Reviewed: Allergy & Precautions, NPO status , Patient's Chart, lab work & pertinent test results  History of Anesthesia Complications Negative for: history of anesthetic complications  Airway Mallampati: II  TM Distance: >3 FB     Dental  (+) Edentulous Upper, Edentulous Lower, Dental Advisory Given   Pulmonary neg shortness of breath, neg sleep apnea, neg COPD, neg recent URI   breath sounds clear to auscultation       Cardiovascular negative cardio ROS  Rhythm:Regular     Neuro/Psych neg Seizures PSYCHIATRIC DISORDERS  Depression     Neuromuscular disease    GI/Hepatic negative GI ROS, Neg liver ROS,,,  Endo/Other  negative endocrine ROS    Renal/GU negative Renal ROS     Musculoskeletal  (+) Arthritis ,    Abdominal   Peds  Hematology Lab Results      Component                Value               Date                      WBC                      5.4                 12/17/2024                HGB                      13.6                12/17/2024                HCT                      41.7                12/17/2024                MCV                      94.6                12/17/2024                PLT                      195                 12/17/2024              Anesthesia Other Findings   Reproductive/Obstetrics                              Anesthesia Physical Anesthesia Plan  ASA: 2  Anesthesia Plan: General   Post-op Pain Management:    Induction: Intravenous  PONV Risk Score and Plan: 3 and Ondansetron  and Dexamethasone   Airway Management Planned: Oral ETT  Additional Equipment: None  Intra-op Plan:   Post-operative Plan: Extubation in OR  Informed Consent: I have reviewed the patients History and Physical, chart, labs and discussed the procedure including the risks,  benefits and alternatives for the proposed anesthesia with the patient or authorized representative who has indicated his/her understanding and acceptance.     Dental advisory given  Plan Discussed with: CRNA  Anesthesia Plan Comments:         Anesthesia Quick Evaluation  "

## 2024-12-27 NOTE — Progress Notes (Signed)
 Patient ID: Alexander Castillo, male   DOB: May 02, 1941, 85 y.o.   MRN: [id number] Pt doing well, has walked the halls, no real pain, walking better, hands stable, normal neuro exam. Home in am

## 2024-12-27 NOTE — Transfer of Care (Signed)
 Immediate Anesthesia Transfer of Care Note  Patient: Alexander Castillo  Procedure(s) Performed: ANTERIOR CERVICAL DECOMPRESSION/DISCECTOMY FUSION CERVICAL THREE-CERVICAL FOUR, CERVICAL FOUR-CERVICAL FIVE  Patient Location: PACU  Anesthesia Type:General  Level of Consciousness: drowsy  Airway & Oxygen Therapy: Patient Spontanous Breathing and Patient connected to nasal cannula oxygen  Post-op Assessment: Report given to RN and Post -op Vital signs reviewed and stable  Post vital signs: Reviewed and stable  Last Vitals:  Vitals Value Taken Time  BP 113/58 12/27/24 12:30  Temp    Pulse 80 12/27/24 12:32  Resp 16 12/27/24 12:32  SpO2 96 % 12/27/24 12:32  Vitals shown include unfiled device data.  Last Pain:  Vitals:   12/27/24 0842  TempSrc:   PainSc: 0-No pain         Complications: No notable events documented.

## 2024-12-27 NOTE — Op Note (Signed)
 12/27/2024  12:19 PM  PATIENT:  Alexander Castillo  84 y.o. male  PRE-OPERATIVE DIAGNOSIS: Cervical spondylosis with cervical spinal stenosis with cervical spondylitic myelopathy  POST-OPERATIVE DIAGNOSIS:  same  PROCEDURE:  1. Decompressive anterior cervical discectomy C3-4 C4-5, 2. Anterior cervical arthrodesis C3-4 C4-5 utilizing a porous titanium interbody cage packed with locally harvested morcellized autologous bone graft and DBM putty, 3. Anterior cervical plating C3-4 C4-5 utilizing a separate ATEC plate at each level  SURGEON:  Alm Molt, MD  ASSISTANTS: Suzen Pean, FNP  ANESTHESIA:   General  EBL: Less than 50 ml  Total I/O In: 100 [IV Piggyback:100] Out: -   BLOOD ADMINISTERED: none  DRAINS: none  SPECIMEN:  none  INDICATION FOR PROCEDURE: This patient presented with neck pain with numbness in his hands and instability of gait. Imaging showed severe spinal stenosis at C3-4 with cord compression, moderate stenosis C4-5 and ankylosis of the rest of the cervical spine. The patient tried conservative measures without relief. Pain was debilitating. Recommended ACDF with plating. Patient understood the risks, benefits, and alternatives and potential outcomes and wished to proceed.  PROCEDURE DETAILS: Patient was brought to the operating room placed under general endotracheal anesthesia. Patient was placed in the supine position on the operating room table. The neck was prepped with Duraprep and draped in a sterile fashion.   Three cc of local anesthesia was injected and a transverse incision was made on the right side of the neck.  Dissection was carried down thru the subcutaneous tissue and the platysma was  elevated, opened, and undermined with Metzenbaum scissors.  Dissection was then carried out thru an avascular plane leaving the sternocleidomastoid carotid artery and jugular vein laterally and the trachea and esophagus medially with the assistance of my nurse  practitioner. The ventral aspect of the vertebral column was identified and a localizing x-ray was taken. The C4-5 level was identified and all in the room agreed with the level. The longus colli muscles were then elevated and the retractor was placed with the assistance of my nurse practitioner to expose C3-4 and C4-5. The annulus was incised and the disc space entered. Discectomy was performed with micro-curettes and pituitary rongeurs. I then used the high-speed drill to drill the endplates down to the level of the posterior longitudinal ligament. The drill shavings were saved in a mucous trap for later arthrodesis. The operating microscope was draped and brought into the field provided additional magnification, illumination and visualization. Discectomy was continued posteriorly thru the disc space. Posterior longitudinal ligament was opened with a nerve hook, and then removed along with disc herniation and osteophytes, decompressing the spinal canal and thecal sac. We then continued to remove osteophytic overgrowth and disc material decompressing the neural foramina and exiting nerve roots bilaterally. The scope was angled up and down to help decompress and undercut the vertebral bodies. Once the decompression was completed we could pass a nerve hook circumferentially to assure adequate decompression in the midline and in the neural foramina. So by both visualization and palpation we felt we had an adequate decompression of the neural elements. We then measured the height of the intravertebral disc space and selected a 8 millimeter porous titanium interbody cage packed with autograft and DBM putty. It was then gently positioned in the intravertebral disc space(s) and countersunk. I then used a ATEC plate at each separate level and placed variable angle screws into the vertebral bodies of each level and locked them into position. The wound was irrigated with saline  solution, checked for hemostasis which was  established and confirmed. Once meticulous hemostasis was achieved, we then proceeded with closure with the assistance of my nurse practitioner. The platysma was closed with interrupted 3-0 undyed Vicryl suture, the subcuticular layer was closed with interrupted 3-0 undyed Vicryl suture. The skin edges were approximated with steristrips. The drapes were removed. A sterile dressing was applied. The patient was then awakened from general anesthesia and transferred to the recovery room in stable condition. At the end of the procedure all sponge, needle and instrument counts were correct.   PLAN OF CARE: Admit for overnight observation  PATIENT DISPOSITION:  PACU - hemodynamically stable.   Delay start of Pharmacological VTE agent (>24hrs) due to surgical blood loss or risk of bleeding:  yes

## 2024-12-27 NOTE — Progress Notes (Signed)
 Patient alert and oriented, voided, ambulate with walker, 1 person assist. Report given to 5 N nurse. Patient transfer to 5N per order.

## 2024-12-28 DIAGNOSIS — M4802 Spinal stenosis, cervical region: Secondary | ICD-10-CM | POA: Diagnosis not present

## 2024-12-28 MED ORDER — METHOCARBAMOL 500 MG PO TABS: 500.0000 mg | ORAL_TABLET | Freq: Four times a day (QID) | ORAL | 1 refills | Status: AC | PRN

## 2024-12-28 MED ORDER — HYDROCODONE-ACETAMINOPHEN 5-325 MG PO TABS: 1.0000 | ORAL_TABLET | ORAL | 0 refills | Status: AC | PRN

## 2024-12-28 NOTE — Evaluation (Signed)
 Occupational Therapy Evaluation Patient Details Name: Alexander Castillo MRN: [id number] DOB: 26-Oct-1941 Today's Date: 12/28/2024   History of Present Illness   84 y.o. male presents 12/27/24 with neck pian, hand numbness, and poor balance; onset of symptoms several months ago. S/p ACDF C3-4, C4-5. PMH: osteoarthritis, HLD, degenerative joint disease.     Clinical Impressions PTA Pt reports he was independent with ADLs and functional mobility. Pt currently requires up to CGA for functional transfers and Min A for ADL tasks. Pt primarily limited by cervical precautions, decreased knowledge of precautions, impaired sensation bilateral hands. OT to continue to follow Pt acutely to facilitate progress towards goals. Anticipate that Pt can return home with level of assist as outlined below once medically cleared for d/c without OT follow up.      If plan is discharge home, recommend the following:   A little help with walking and/or transfers;A little help with bathing/dressing/bathroom;Assistance with cooking/housework;Help with stairs or ramp for entrance;Assist for transportation     Functional Status Assessment   Patient has had a recent decline in their functional status and demonstrates the ability to make significant improvements in function in a reasonable and predictable amount of time.     Equipment Recommendations   None recommended by OT     Recommendations for Other Services         Precautions/Restrictions   Precautions Precautions: Fall;Cervical Precaution Booklet Issued: Yes (comment) Recall of Precautions/Restrictions: Intact Restrictions Weight Bearing Restrictions Per Provider Order: No     Mobility Bed Mobility               General bed mobility comments: Pt greeted in recliner and returned to recliner    Transfers Overall transfer level: Needs assistance Equipment used: Rolling walker (2 wheels) Transfers: Sit to/from Stand Sit to Stand:  Contact guard assist           General transfer comment: CGA for sit to stand transfer with RW.      Balance Overall balance assessment: Mild deficits observed, not formally tested                                         ADL either performed or assessed with clinical judgement   ADL Overall ADL's : Needs assistance/impaired Eating/Feeding: Set up;Sitting   Grooming: Set up;Sitting   Upper Body Bathing: Minimal assistance;Sitting   Lower Body Bathing: Minimal assistance;Sitting/lateral leans   Upper Body Dressing : Set up;Sitting   Lower Body Dressing: Minimal assistance Lower Body Dressing Details (indicate cue type and reason): Pt reports he has device to assist with donning socks Toilet Transfer: Contact guard assist;Ambulation;Rolling walker (2 wheels);BSC/3in1   Toileting- Clothing Manipulation and Hygiene: Contact guard assist               Vision Patient Visual Report: No change from baseline Vision Assessment?: No apparent visual deficits     Perception         Praxis         Pertinent Vitals/Pain Pain Assessment Pain Assessment: Faces Faces Pain Scale: Hurts a little bit Pain Location: Incision site Pain Descriptors / Indicators: Discomfort Pain Intervention(s): Limited activity within patient's tolerance, Monitored during session     Extremity/Trunk Assessment Upper Extremity Assessment Upper Extremity Assessment: RUE deficits/detail;LUE deficits/detail RUE Deficits / Details: History of numbness in BUE. Noted decreased sensation in fingertips RUE Sensation: history of peripheral neuropathy  RUE Coordination: decreased fine motor LUE Deficits / Details: History of numbness in BUE. Noted decreased sensation in fingertips LUE Sensation: history of peripheral neuropathy LUE Coordination: decreased fine motor       Cervical / Trunk Assessment Cervical / Trunk Assessment: Neck Surgery   Communication  Communication Communication: No apparent difficulties   Cognition Arousal: Alert Behavior During Therapy: WFL for tasks assessed/performed Cognition: No apparent impairments                               Following commands: Intact       Cueing  General Comments   Cueing Techniques: Verbal cues  Educated Pt and family member at bedside on cervical precautions and functional implications of precautions on ADL tasks   Exercises     Shoulder Instructions      Home Living Family/patient expects to be discharged to:: Private residence Living Arrangements: Alone Available Help at Discharge: Family;Friend(s);Available 24 hours/day Type of Home: House Home Access: Stairs to enter;Ramped entrance Entrance Stairs-Number of Steps: 2; can use ramp from garage Entrance Stairs-Rails: None Home Layout: One level     Bathroom Shower/Tub: Walk-in shower         Home Equipment: Agricultural Consultant (2 wheels);Cane - single point;Grab bars - tub/shower;Shower seat          Prior Functioning/Environment Prior Level of Function : Independent/Modified Independent;Driving             Mobility Comments: Independent ADLs Comments: Independent    OT Problem List: Decreased strength;Decreased activity tolerance;Impaired balance (sitting and/or standing);Decreased knowledge of use of DME or AE;Decreased knowledge of precautions;Impaired sensation;Pain   OT Treatment/Interventions: Self-care/ADL training;Therapeutic exercise;DME and/or AE instruction;Energy conservation;Therapeutic activities;Patient/family education;Balance training      OT Goals(Current goals can be found in the care plan section)   Acute Rehab OT Goals Patient Stated Goal: to go home OT Goal Formulation: With patient Time For Goal Achievement: 01/11/25 Potential to Achieve Goals: Good ADL Goals Pt Will Perform Grooming: with modified independence;standing Pt Will Perform Lower Body Bathing: with  modified independence;with adaptive equipment Pt Will Perform Lower Body Dressing: with modified independence;with adaptive equipment Pt Will Transfer to Toilet: with modified independence;ambulating   OT Frequency:  Min 2X/week    Co-evaluation              AM-PAC OT 6 Clicks Daily Activity     Outcome Measure Help from another person eating meals?: A Little Help from another person taking care of personal grooming?: A Little Help from another person toileting, which includes using toliet, bedpan, or urinal?: A Little Help from another person bathing (including washing, rinsing, drying)?: A Little Help from another person to put on and taking off regular upper body clothing?: A Little Help from another person to put on and taking off regular lower body clothing?: A Little 6 Click Score: 18   End of Session Nurse Communication:  (Pt care handed off to PT)  Activity Tolerance: Patient tolerated treatment well Patient left: in chair;with call bell/phone within reach;with family/visitor present  OT Visit Diagnosis: Muscle weakness (generalized) (M62.81);Other abnormalities of gait and mobility (R26.89)                Time: 9064-9044 OT Time Calculation (min): 20 min Charges:  OT General Charges $OT Visit: 1 Visit OT Evaluation $OT Eval Low Complexity: 1 Low  Alexander Castillo, OTR/L.  [facility]  Office: [phone]  Alexander Castillo 12/28/2024, 10:07 AM

## 2024-12-28 NOTE — Discharge Summary (Signed)
 Physician Discharge Summary  Patient ID: Alexander Castillo MRN: [id number] DOB/AGE: 86-16-1942 86 y.o.  Admit date: 12/27/2024 Discharge date: 12/28/2024  Admission Diagnoses:cervical stenosis C3-5 with myelopathy  Discharge Diagnoses: same Principal Problem:   S/P cervical spinal fusion   Discharged Condition: good  Hospital Course: Mr. Bibbee was admitted and taken to the operating room for a two level ACDF at C3/4,4/5 using two one level plates by ATEC. Post op he is ambulating, voiding, tolerating a regular diet, and speaking with a strong voice. He is moving all extremities well. The wound dressing is clean, dry, and without signs of infection.   Treatments: surgery: 1. Decompressive anterior cervical discectomy C3-4 C4-5, 2. Anterior cervical arthrodesis C3-4 C4-5 utilizing a porous titanium interbody cage packed with locally harvested morcellized autologous bone graft and DBM putty, 3. Anterior cervical plating C3-4 C4-5 utilizing a separate ATEC plate at each level   Discharge Exam: Blood pressure (!) 161/72, pulse 87, temperature 97.8 F (36.6 C), temperature source Oral, resp. rate 16, height 5' 7 (1.702 m), weight 77.1 kg, SpO2 96%. General appearance: alert, cooperative, appears stated age, and no distress  Disposition: Discharge disposition: 01-Home or Self Care      Cervical spinal stenosis  Allergies as of 12/28/2024   No Known Allergies      Medication List     TAKE these medications    azelastine  0.1 % nasal spray Commonly known as: ASTELIN  Place 2 sprays into both nostrils 2 (two) times daily. Use in each nostril as directed   desloratadine  5 MG tablet Commonly known as: CLARINEX  Take 1 tablet (5 mg total) by mouth daily.   Gemtesa 75 MG Tabs Generic drug: Vibegron Take 75 mg by mouth daily.   HYDROcodone -acetaminophen  5-325 MG tablet Commonly known as: NORCO/VICODIN Take 1 tablet by mouth every 4 (four) hours as needed for moderate pain  (pain score 4-6).   methocarbamol  500 MG tablet Commonly known as: ROBAXIN  Take 1 tablet (500 mg total) by mouth every 6 (six) hours as needed for muscle spasms.   sodium chloride  0.65 % Soln nasal spray Commonly known as: OCEAN Place 1 spray into both nostrils as needed.         SignedBETHA Rockey [place] 12/28/2024, 12:45 PM

## 2024-12-28 NOTE — Progress Notes (Signed)
 Discharge instructions (including medications) discussed with and copy provided to patient. Patient and family given the opportunity to ask questions. Questions clarified.

## 2024-12-28 NOTE — Evaluation (Addendum)
 Physical Therapy Evaluation Patient Details Name: Alexander Castillo MRN: [id number] DOB: 05-09-1941 Today's Date: 12/28/2024  History of Present Illness  84 y.o. male presents 12/27/24 with neck pian, hand numbness, and poor balance; onset of symptoms several months ago. S/p ACDF C3-4, C4-5. PMH: osteoarthritis, HLD, degenerative joint disease.  Clinical Impression  Pt presents with admitting diagnosis above. Pt today was able to ambulate in hallway with RW and navigate stairs with supervision. Cervical precaution handout provided by OT prior to session and verbally reviewed. Pt able to recall 3/3 precautions. Defer to physician follow up recs however pt may benefit from some OPPT in the future. Ok for DC home today from PT perspective. PT will continue to follow.         If plan is discharge home, recommend the following: Assist for transportation;Help with stairs or ramp for entrance   Can travel by private vehicle        Equipment Recommendations None recommended by PT  Recommendations for Other Services       Functional Status Assessment Patient has had a recent decline in their functional status and demonstrates the ability to make significant improvements in function in a reasonable and predictable amount of time.     Precautions / Restrictions Precautions Precautions: Fall;Cervical Precaution Booklet Issued: Yes (comment) (OT provided prior to this session.) Recall of Precautions/Restrictions: Intact Precaution/Restrictions Comments: Able to recall precautions 3/3 following OT session. Restrictions Weight Bearing Restrictions Per Provider Order: No      Mobility  Bed Mobility               General bed mobility comments: Pt greeted in recliner and returned to recliner    Transfers Overall transfer level: Needs assistance Equipment used: None Transfers: Sit to/from Stand Sit to Stand: Supervision           General transfer comment: Supervision for safety.     Ambulation/Gait Ambulation/Gait assistance: Supervision Gait Distance (Feet): 550 Feet Assistive device: Rolling walker (2 wheels) Gait Pattern/deviations: Trunk flexed, Step-through pattern Gait velocity: decreased Gait velocity interpretation: >4.37 ft/sec, indicative of normal walking speed   General Gait Details: no LOB noted. Pt opted for RW for hallway ambulation. Cues for proximity to RW.  Stairs Stairs: Yes Stairs assistance: Supervision Stair Management: One rail Right, Alternating pattern, Forwards Number of Stairs: 2 General stair comments: no LOB noted  Wheelchair Mobility     Tilt Bed    Modified Rankin (Stroke Patients Only)       Balance Overall balance assessment: Mild deficits observed, not formally tested                                           Pertinent Vitals/Pain Pain Assessment Pain Assessment: Faces Faces Pain Scale: Hurts a little bit Pain Location: Incision site Pain Descriptors / Indicators: Discomfort Pain Intervention(s): Limited activity within patient's tolerance, Monitored during session    Home Living Family/patient expects to be discharged to:: Private residence Living Arrangements: Alone Available Help at Discharge: Family;Friend(s);Available 24 hours/day Type of Home: House Home Access: Stairs to enter;Ramped entrance Entrance Stairs-Rails: None Entrance Stairs-Number of Steps: 2; can use ramp from garage   Home Layout: One level Home Equipment: Agricultural Consultant (2 wheels);Cane - single point;Grab bars - tub/shower;Shower seat      Prior Function Prior Level of Function : Independent/Modified Independent;Driving  Mobility Comments: Independent ADLs Comments: Independent     Extremity/Trunk Assessment   Upper Extremity Assessment Upper Extremity Assessment: Defer to OT evaluation RUE Deficits / Details: History of numbness in BUE. Noted decreased sensation in fingertips RUE  Sensation: history of peripheral neuropathy RUE Coordination: decreased fine motor LUE Deficits / Details: History of numbness in BUE. Noted decreased sensation in fingertips LUE Sensation: history of peripheral neuropathy LUE Coordination: decreased fine motor    Lower Extremity Assessment Lower Extremity Assessment: Overall WFL for tasks assessed    Cervical / Trunk Assessment Cervical / Trunk Assessment: Neck Surgery  Communication   Communication Communication: No apparent difficulties    Cognition Arousal: Alert Behavior During Therapy: WFL for tasks assessed/performed                             Following commands: Intact       Cueing Cueing Techniques: Verbal cues     General Comments General comments (skin integrity, edema, etc.): Wife present and supportive. Reviewed cervical precautions with pt and wife that OT had provided. Educated on frequent mobility at home    Exercises     Assessment/Plan    PT Assessment Patient needs continued PT services  PT Problem List Decreased activity tolerance;Decreased strength;Decreased range of motion;Decreased mobility;Decreased balance;Decreased coordination;Decreased cognition;Decreased safety awareness;Cardiopulmonary status limiting activity;Decreased knowledge of use of DME;Decreased knowledge of precautions       PT Treatment Interventions DME instruction;Gait training;Stair training;Functional mobility training;Therapeutic activities;Therapeutic exercise;Balance training;Neuromuscular re-education;Cognitive remediation;Patient/family education    PT Goals (Current goals can be found in the Care Plan section)  Acute Rehab PT Goals Patient Stated Goal: to go home PT Goal Formulation: With patient Time For Goal Achievement: 01/11/25 Potential to Achieve Goals: Good    Frequency Min 2X/week     Co-evaluation               AM-PAC PT 6 Clicks Mobility  Outcome Measure Help needed turning from  your back to your side while in a flat bed without using bedrails?: A Little Help needed moving from lying on your back to sitting on the side of a flat bed without using bedrails?: A Little Help needed moving to and from a bed to a chair (including a wheelchair)?: A Little Help needed standing up from a chair using your arms (e.g., wheelchair or bedside chair)?: A Little Help needed to walk in hospital room?: A Little Help needed climbing 3-5 steps with a railing? : A Little 6 Click Score: 18    End of Session Equipment Utilized During Treatment: Gait belt Activity Tolerance: Patient tolerated treatment well Patient left: in chair;with call bell/phone within reach Nurse Communication: Mobility status PT Visit Diagnosis: Other abnormalities of gait and mobility (R26.89)    Time: 9040-8987 PT Time Calculation (min) (ACUTE ONLY): 13 min   Charges:   PT Evaluation $PT Eval Moderate Complexity: 1 Mod   PT General Charges $$ ACUTE PT VISIT: 1 Visit         Favor Kreh B, PT, DPT Acute Rehab Services [phone]   Ayvion Kavanagh 12/28/2024, 10:50 AM

## 2024-12-28 NOTE — Plan of Care (Signed)
 " Problem: Education: Goal: Knowledge of General Education information will improve Description: Including pain rating scale, medication(s)/side effects and non-pharmacologic comfort measures Outcome: Adequate for Discharge   Problem: Health Behavior/Discharge Planning: Goal: Ability to manage health-related needs will improve Outcome: Adequate for Discharge   Problem: Clinical Measurements: Goal: Ability to maintain clinical measurements within normal limits will improve Outcome: Adequate for Discharge Goal: Will remain free from infection Outcome: Adequate for Discharge Goal: Diagnostic test results will improve Outcome: Adequate for Discharge Goal: Respiratory complications will improve Outcome: Adequate for Discharge Goal: Cardiovascular complication will be avoided Outcome: Adequate for Discharge   Problem: Activity: Goal: Risk for activity intolerance will decrease Outcome: Adequate for Discharge   Problem: Nutrition: Goal: Adequate nutrition will be maintained Outcome: Adequate for Discharge   Problem: Coping: Goal: Level of anxiety will decrease Outcome: Adequate for Discharge   Problem: Elimination: Goal: Will not experience complications related to bowel motility Outcome: Adequate for Discharge Goal: Will not experience complications related to urinary retention Outcome: Adequate for Discharge   Problem: Pain Managment: Goal: General experience of comfort will improve and/or be controlled Outcome: Adequate for Discharge   Problem: Safety: Goal: Ability to remain free from injury will improve Outcome: Adequate for Discharge   Problem: Skin Integrity: Goal: Risk for impaired skin integrity will decrease Outcome: Adequate for Discharge   Problem: Education: Goal: Knowledge of the prescribed therapeutic regimen will improve Outcome: Adequate for Discharge   Problem: Bowel/Gastric: Goal: Gastrointestinal status for postoperative course will  improve Outcome: Adequate for Discharge   Problem: Cardiac: Goal: Ability to maintain an adequate cardiac output Outcome: Adequate for Discharge Goal: Will show no evidence of cardiac arrhythmias Outcome: Adequate for Discharge   Problem: Nutritional: Goal: Will attain and maintain optimal nutritional status Outcome: Adequate for Discharge   Problem: Neurological: Goal: Will regain or maintain usual level of consciousness Outcome: Adequate for Discharge   Problem: Clinical Measurements: Goal: Ability to maintain clinical measurements within normal limits Outcome: Adequate for Discharge Goal: Postoperative complications will be avoided or minimized Outcome: Adequate for Discharge   Problem: Respiratory: Goal: Will regain and/or maintain adequate ventilation Outcome: Adequate for Discharge Goal: Respiratory status will improve Outcome: Adequate for Discharge   Problem: Skin Integrity: Goal: Demonstrates signs of wound healing without infection Outcome: Adequate for Discharge   Problem: Urinary Elimination: Goal: Will remain free from infection Outcome: Adequate for Discharge Goal: Ability to achieve and maintain adequate urine output Outcome: Adequate for Discharge   Problem: Education: Goal: Ability to verbalize activity precautions or restrictions will improve Outcome: Adequate for Discharge Goal: Knowledge of the prescribed therapeutic regimen will improve Outcome: Adequate for Discharge Goal: Understanding of discharge needs will improve Outcome: Adequate for Discharge   Problem: Activity: Goal: Ability to avoid complications of mobility impairment will improve Outcome: Adequate for Discharge Goal: Ability to tolerate increased activity will improve Outcome: Adequate for Discharge Goal: Will remain free from falls Outcome: Adequate for Discharge   Problem: Bowel/Gastric: Goal: Gastrointestinal status for postoperative course will improve Outcome: Adequate  for Discharge   Problem: Clinical Measurements: Goal: Ability to maintain clinical measurements within normal limits will improve Outcome: Adequate for Discharge Goal: Postoperative complications will be avoided or minimized Outcome: Adequate for Discharge Goal: Diagnostic test results will improve Outcome: Adequate for Discharge   Problem: Pain Management: Goal: Pain level will decrease Outcome: Adequate for Discharge   Problem: Skin Integrity: Goal: Will show signs of wound healing Outcome: Adequate for Discharge   Problem: Health Behavior/Discharge Planning:  Goal: Identification of resources available to assist in meeting health care needs will improve Outcome: Adequate for Discharge   Problem: Bladder/Genitourinary: Goal: Urinary functional status for postoperative course will improve Outcome: Adequate for Discharge   Problem: Acute Rehab OT Goals (only OT should resolve) Goal: Pt. Will Perform Grooming Outcome: Adequate for Discharge Goal: Pt. Will Perform Lower Body Bathing Outcome: Adequate for Discharge Goal: Pt. Will Perform Lower Body Dressing Outcome: Adequate for Discharge Goal: Pt. Will Transfer To Toilet Outcome: Adequate for Discharge   "

## 2024-12-30 NOTE — Anesthesia Postprocedure Evaluation (Signed)
"   Anesthesia Post Note  Patient: Alexander Castillo  Procedure(s) Performed: ANTERIOR CERVICAL DECOMPRESSION/DISCECTOMY FUSION CERVICAL THREE-CERVICAL FOUR, CERVICAL FOUR-CERVICAL FIVE     Patient location during evaluation: PACU Anesthesia Type: General Level of consciousness: awake and alert Pain management: pain level controlled Vital Signs Assessment: post-procedure vital signs reviewed and stable Respiratory status: spontaneous breathing, nonlabored ventilation and respiratory function stable Cardiovascular status: blood pressure returned to baseline and stable Postop Assessment: no apparent nausea or vomiting Anesthetic complications: no   No notable events documented.                Lindel Marcell      "

## 2025-01-03 ENCOUNTER — Encounter (HOSPITAL_COMMUNITY): Payer: Self-pay | Admitting: Neurological Surgery

## 2025-03-21 ENCOUNTER — Encounter: Admitting: Nurse Practitioner

## 2025-08-26 ENCOUNTER — Encounter: Admitting: Internal Medicine
